# Patient Record
Sex: Female | Born: 1985 | Hispanic: Yes | Marital: Married | Smoking: Never smoker
Health system: Southern US, Community
[De-identification: ages and names within clinical notes are randomized; demographics above are authoritative.]

## PROBLEM LIST (undated history)

## (undated) ENCOUNTER — Inpatient Hospital Stay (HOSPITAL_COMMUNITY): Payer: Self-pay

## (undated) DIAGNOSIS — J302 Other seasonal allergic rhinitis: Secondary | ICD-10-CM

## (undated) DIAGNOSIS — O24419 Gestational diabetes mellitus in pregnancy, unspecified control: Secondary | ICD-10-CM

## (undated) DIAGNOSIS — Z973 Presence of spectacles and contact lenses: Secondary | ICD-10-CM

## (undated) DIAGNOSIS — F3281 Premenstrual dysphoric disorder: Secondary | ICD-10-CM

## (undated) DIAGNOSIS — Q513 Bicornate uterus: Secondary | ICD-10-CM

## (undated) DIAGNOSIS — N96 Recurrent pregnancy loss: Secondary | ICD-10-CM

## (undated) DIAGNOSIS — O039 Complete or unspecified spontaneous abortion without complication: Secondary | ICD-10-CM

## (undated) DIAGNOSIS — F411 Generalized anxiety disorder: Secondary | ICD-10-CM

## (undated) DIAGNOSIS — F339 Major depressive disorder, recurrent, unspecified: Secondary | ICD-10-CM

## (undated) HISTORY — DX: Gestational diabetes mellitus in pregnancy, unspecified control: O24.419

## (undated) HISTORY — DX: Premenstrual dysphoric disorder: F32.81

## (undated) HISTORY — DX: Other seasonal allergic rhinitis: J30.2

## (undated) HISTORY — DX: Generalized anxiety disorder: F41.1

## (undated) HISTORY — DX: Major depressive disorder, recurrent, unspecified: F33.9

## (undated) HISTORY — PX: WISDOM TOOTH EXTRACTION: SHX21

---

## 2011-01-27 ENCOUNTER — Ambulatory Visit (INDEPENDENT_AMBULATORY_CARE_PROVIDER_SITE_OTHER): Payer: Managed Care, Other (non HMO) | Admitting: Family Medicine

## 2011-01-27 ENCOUNTER — Encounter: Payer: Self-pay | Admitting: Family Medicine

## 2011-01-27 DIAGNOSIS — J069 Acute upper respiratory infection, unspecified: Secondary | ICD-10-CM

## 2011-02-02 NOTE — Assessment & Plan Note (Signed)
Summary: Congestion   Vital Signs:  Patient profile:   25 year old female Weight:      143.75 pounds O2 Sat:      99 % on Room air Temp:     98.2 degrees F oral Pulse rate:   90 / minute BP sitting:   130 / 87  (right arm) Cuff size:   regular  Vitals Entered By: Francee Piccolo CMA Duncan Dull) (January 27, 2011 10:55 AM)  O2 Flow:  Room air CC: ear ache, congestion, upset stomach//SP, URI symptoms Is Patient Diabetic? No   History of Present Illness: New patient today:       This is a 25 year old hispanic woman who presents with URI symptoms for the last 24 hours.  The patient complains of nasal congestion and clear nasal discharge, but denies sore throat, dry cough, productive cough, and earache.  The patient denies fever, dyspnea, and wheezing.   Two members of her household have had similar symptoms in the last 2 wks.  She got her flu vaccine this season. No meds have been tried.  Preventive Screening-Counseling & Management  Alcohol-Tobacco     Alcohol drinks/day: 0     Smoking Status: never  Current Medications (verified): 1)  None  Allergies (verified): No Known Drug Allergies  Past History:  Past Medical History: Seasonal allergic rhinitis  Past Surgical History: NONE  Family History: Mom and dad healthy. Three younger sisters healthy. Pat. GP: CVA and DM. Mat GP: d. 42 of "stomach cancer".  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  Is originally from [place]. No T/A/Ds.  No regular exercise. No recent visits to [place].Smoking Status:  never  Review of Systems       Decreased appetite.  No n/v/d or rash.  Also, see HPI.  Physical Exam  General:  VS normal Gen: alert, NAD, NONTOXIC. HEENT: eyes without injection, drainage, or swelling.  Ears: EACs clear, TMs with normal light reflex and landmarks.  Nose: some dried, crusty exudate adherent to some mildly injected mucosa.  No purulent d/c.  No  paranasal sinus TTP.  No facial swelling.  Throat and mouth without focal lesion.  No pharyngial swelling, erythema, or exudate.   Neck: supple, no LAD.  LUNGS: CTA bilat, nonlabored resps.  CV: RRR, no m/r/g.     Impression & Recommendations:  Problem # 1:  VIRAL URI (ICD-465.9) Assessment New Discussed self limited nature of this illness, symptomatic care, problems to call or return for.  Patient Instructions: 1)  AVOID ASPIRIN 2)  Take zyrtec D OR Allegra D (over the counter meds) for your cold symptoms. 3)  Drink 6-8 glasses of water daily.  Rest as much as you can. 4)  Call or return if not feeling improved in 7-10 days.   Orders Added: 1)  New Patient Level II [99202]

## 2011-02-07 ENCOUNTER — Encounter: Payer: Self-pay | Admitting: Family Medicine

## 2011-10-09 ENCOUNTER — Encounter: Payer: Self-pay | Admitting: Family Medicine

## 2011-10-09 ENCOUNTER — Ambulatory Visit (INDEPENDENT_AMBULATORY_CARE_PROVIDER_SITE_OTHER): Payer: Managed Care, Other (non HMO) | Admitting: Family Medicine

## 2011-10-09 DIAGNOSIS — H60509 Unspecified acute noninfective otitis externa, unspecified ear: Secondary | ICD-10-CM

## 2011-10-09 DIAGNOSIS — H60549 Acute eczematoid otitis externa, unspecified ear: Secondary | ICD-10-CM

## 2011-10-09 DIAGNOSIS — J309 Allergic rhinitis, unspecified: Secondary | ICD-10-CM | POA: Insufficient documentation

## 2011-10-09 MED ORDER — FEXOFENADINE HCL 180 MG PO TABS: 180.0000 mg | ORAL_TABLET | Freq: Every day | ORAL | Status: DC

## 2011-10-09 MED ORDER — FLUTICASONE PROPIONATE 50 MCG/ACT NA SUSP: 2.0000 | Freq: Every day | NASAL | Status: DC

## 2011-10-09 MED ORDER — NEOMYCIN-POLYMYXIN-HC 1 % OT SOLN: OTIC | Status: DC

## 2011-10-09 NOTE — Progress Notes (Signed)
OFFICE NOTE  10/09/2011  CC:  Chief Complaint  Patient presents with  . Ear Fullness    itching, pain sometimes, left worse than right, pressure, fever on Saturday.     HPI:   Patient is a 25 y.o. hispanic female who is here for allergy sx's and ear itching. Describes 1-2 months of waxing and waning nasal congestion/runny nose, fullness in upper face/orbits, watery eyes sometimes, itchy ear canals, postnasal drip, mild HA's in frontal area. Says cetirizine, which she uses rarely lately, causes her to feel drowsy.  She uses Q tips frequently to scratch the canals of her ears.  Pertinent PMH:  Never a smoker. No hx of asthma  MEDS;   No outpatient prescriptions prior to visit.    PE: Blood pressure 116/84, pulse 70, temperature 98.4 F (36.9 C), temperature source Oral, weight 148 lb (67.132 kg), last menstrual period 09/24/2011, SpO2 98.00%. Gen: Alert, well appearing.  Patient is oriented to person, place, time, and situation. VS: noted--normal. Gen: alert, NAD, NONTOXIC APPEARING. HEENT: eyes without injection, drainage, or swelling.  Ears: EACs clear, with mildly pinkish and hyperkeratotic epithelium in EACs.  No exudate or edema.  TMs with normal light reflex and landmarks.  Nose: Clear rhinorrhea, with some dried, crusty exudate adherent to mildly injected mucosa.  No purulent d/c.  No paranasal sinus TTP.  No facial swelling.  Throat and mouth without focal lesion.  No pharyngial swelling, erythema, or exudate.   Neck: supple, no LAD.   LUNGS: CTA bilat, nonlabored resps.   CV: RRR, no m/r/g. EXT: no c/c/e SKIN: no rash    IMPRESSION AND PLAN:  Allergic rhinitis D/C zyrtec. Start allegra 180mg  qd and flonase 2 sprays qd, plus nasal saline qhs.   Eczema of external ear Cortisporin otic gtts x 10d. Avoid q-tip use.     FOLLOW UP:  Return if symptoms worsen or fail to improve.

## 2011-10-09 NOTE — Assessment & Plan Note (Signed)
Cortisporin otic gtts x 10d. Avoid q-tip use.

## 2011-10-09 NOTE — Assessment & Plan Note (Signed)
D/C zyrtec. Start allegra 180mg  qd and flonase 2 sprays qd, plus nasal saline qhs.

## 2011-10-10 ENCOUNTER — Ambulatory Visit: Payer: Managed Care, Other (non HMO) | Admitting: Family Medicine

## 2012-02-12 ENCOUNTER — Ambulatory Visit: Payer: Managed Care, Other (non HMO) | Admitting: Family Medicine

## 2012-02-20 ENCOUNTER — Ambulatory Visit (INDEPENDENT_AMBULATORY_CARE_PROVIDER_SITE_OTHER): Payer: Managed Care, Other (non HMO) | Admitting: Family Medicine

## 2012-02-20 ENCOUNTER — Encounter: Payer: Self-pay | Admitting: Family Medicine

## 2012-02-20 VITALS — BP 118/78 | HR 90 | Temp 99.0°F | Resp 16 | Ht 61.0 in | Wt 147.0 lb

## 2012-02-20 DIAGNOSIS — Z0000 Encounter for general adult medical examination without abnormal findings: Secondary | ICD-10-CM | POA: Insufficient documentation

## 2012-02-20 DIAGNOSIS — Z23 Encounter for immunization: Secondary | ICD-10-CM

## 2012-02-20 DIAGNOSIS — Z124 Encounter for screening for malignant neoplasm of cervix: Secondary | ICD-10-CM

## 2012-02-20 LAB — CBC WITH DIFFERENTIAL/PLATELET
Eosinophils Relative: 1.3 % (ref 0.0–5.0)
HCT: 43 % (ref 36.0–46.0)
Hemoglobin: 14.3 g/dL (ref 12.0–15.0)
Lymphs Abs: 2.3 10*3/uL (ref 0.7–4.0)
MCV: 89.3 fl (ref 78.0–100.0)
Monocytes Absolute: 0.6 10*3/uL (ref 0.1–1.0)
Monocytes Relative: 9.3 % (ref 3.0–12.0)
Neutro Abs: 3.5 10*3/uL (ref 1.4–7.7)
RDW: 13.4 % (ref 11.5–14.6)

## 2012-02-20 LAB — LIPID PANEL
HDL: 37.2 mg/dL — ABNORMAL LOW (ref >39.00)
Total CHOL/HDL Ratio: 5
VLDL: 16.4 mg/dL (ref 0.0–40.0)

## 2012-02-20 LAB — HEPATITIS B SURFACE ANTIBODY,QUALITATIVE: Hep B S Ab: NEGATIVE

## 2012-02-20 LAB — COMPREHENSIVE METABOLIC PANEL
ALT: 22 U/L (ref 0–35)
AST: 22 U/L (ref 0–37)
Albumin: 4 g/dL (ref 3.5–5.2)
Alkaline Phosphatase: 68 U/L (ref 39–117)
Potassium: 4 mEq/L (ref 3.5–5.1)
Sodium: 139 mEq/L (ref 135–145)
Total Bilirubin: 0.2 mg/dL — ABNORMAL LOW (ref 0.3–1.2)
Total Protein: 7.4 g/dL (ref 6.0–8.3)

## 2012-02-20 NOTE — Assessment & Plan Note (Signed)
Reviewed age and gender appropriate health maintenance issues (prudent diet, regular exercise, health risks of tobacco and excessive alcohol, use of seatbelts, fire alarms in home, use of sunscreen).  Also reviewed age and gender appropriate health screening as well as vaccine recommendations.  Reassured pt regarding her recent nonfasting (and incomplete) cholesterol screening done at [facility]. Refer to GYN--she has never had pelvic or pap. Health screening panel today: CBC, CMET, TSH, FLP. No vaccine hx: will check titers of varicella and MMR, will give Tdap and TB skin test today (return in less than 30 day for REPEAT of TB test as per her school health form requirment).  Check Hep B surface antibody.   She had flu vaccine this season and will get details of when/where soon. Next CPE 1 yr.

## 2012-02-20 NOTE — Progress Notes (Signed)
Office Note 02/20/2012  CC:  Chief Complaint  Patient presents with  . school physical    HPI:  Sandra Bauer is a 26 y.o. Hispanic female who is here for CPE, has been accepted into nursing school. Feels well, no complaints except intermittent bumpy and itchy rash that comes out after touching [employer] at her job, uses topical benadryl and it helps.  No rash under any other circumstances.    She has never had a GYN exam.  No hx of sexual activity. She had Tchol and HDL testing done at [facility] 10/2011--nonfasting.  HDL 38, Tchol about 170--she was worried about this, stopped eating cheese and some other high chol/high fat foods. No FH of high cholesterol.   Glucose was normal on this screen.    Past Medical History  Diagnosis Date  . Allergy     seasonal allergic rhinitis    History reviewed. No pertinent past surgical history.  Family History  Problem Relation Age of Onset  . Cancer Maternal Grandfather     "stomach"  . Diabetes Paternal Grandfather   . Stroke Paternal Grandfather     History   Social History  . Marital Status: Single    Spouse Name: N/A    Number of Children: N/A  . Years of Education: N/A   Occupational History  . Not on file.   Social History Main Topics  . Smoking status: Never Smoker   . Smokeless tobacco: Never Used  . Alcohol Use: No  . Drug Use: No  . Sexually Active: Not on file   Other Topics Concern  . Not on file   Social History Narrative  . No narrative on file    Outpatient Prescriptions Prior to Visit  Medication Sig Dispense Refill  . fexofenadine (ALLEGRA) 180 MG tablet Take 1 tablet (180 mg total) by mouth daily.  30 tablet  11  . aspirin 325 MG EC tablet Take 325 mg by mouth daily.        . fluticasone (FLONASE) 50 MCG/ACT nasal spray Place 2 sprays into the nose daily.  16 g  12  . NEOMYCIN-POLYMYXIN-HC, OTIC, (CORTISPORIN) 1 % SOLN 3 drops in each ear every 8 hours x 10d  1 Bottle  2    No Known  Allergies  ROS Review of Systems  Constitutional: Negative for fever, chills, appetite change and fatigue.  HENT: Negative for ear pain, congestion, sore throat, neck stiffness and dental problem.   Eyes: Negative for discharge, redness and visual disturbance.  Respiratory: Negative for cough, chest tightness, shortness of breath and wheezing.   Cardiovascular: Negative for chest pain, palpitations and leg swelling.  Gastrointestinal: Negative for nausea, vomiting, abdominal pain, diarrhea and blood in stool.  Genitourinary: Negative for dysuria, urgency, frequency, hematuria, flank pain and difficulty urinating.  Musculoskeletal: Negative for myalgias, back pain, joint swelling and arthralgias.  Skin: Negative for pallor and rash.  Neurological: Negative for dizziness, speech difficulty, weakness and headaches.  Hematological: Negative for adenopathy. Does not bruise/bleed easily.  Psychiatric/Behavioral: Negative for confusion and sleep disturbance. The patient is not nervous/anxious.     PE; Blood pressure 118/78, pulse 90, temperature 99 F (37.2 C), temperature source Temporal, resp. rate 16, height 5\' 1"  (1.549 m), weight 147 lb (66.679 kg), last menstrual period 01/17/2012. Gen: Alert, well appearing.  Patient is oriented to person, place, time, and situation.  Pleasant affect, lucid thought and speech.  ENT: Ears: EACs clear, normal epithelium.  TMs with good light reflex  and landmarks bilaterally.  Eyes: no injection, icteris, swelling, or exudate.  EOMI, PERRLA. Nose: no drainage or turbinate edema/swelling.  No injection or focal lesion.  Mouth: lips without lesion/swelling.  Oral mucosa pink and moist.  Dentition intact and without obvious caries or gingival swelling.  Oropharynx without erythema, exudate, or swelling.  Neck - No masses or thyromegaly or limitation in range of motion RRR, without murmur, rub, or gallop. Carotid pulses easily palpable, symmetric pulsations, no  bruit or palpable dilatation. Radial, femoral, and ankle pulses easily palpable and symmetric. No abdominal bruit. No varicosities.  Cap refill in extremities is brisk. Chest with symmetric expansion, good aeration, nonlabored respirations.  Clear and equal breath sounds in all lung fields.  No clubbing or cyanosis. ABD: soft, NT, ND, BS normal.  No hepatospenomegaly or mass.  No bruits. EXT: no clubbing, cyanosis, or edema.  Skin - no sores or suspicious lesions or rashes or color changes   Visual Acuity Screening   Right eye Left eye Both eyes  Without correction: 20/30 20/30 20/20   With correction:       Pertinent labs:  None today  ASSESSMENT AND PLAN:   Health maintenance examination Reviewed age and gender appropriate health maintenance issues (prudent diet, regular exercise, health risks of tobacco and excessive alcohol, use of seatbelts, fire alarms in home, use of sunscreen).  Also reviewed age and gender appropriate health screening as well as vaccine recommendations.  Reassured pt regarding her recent nonfasting (and incomplete) cholesterol screening done at [facility]. Refer to GYN--she has never had pelvic or pap. Health screening panel today: CBC, CMET, TSH, FLP. No vaccine hx: will check titers of varicella and MMR, will give Tdap and TB skin test today (return in less than 30 day for REPEAT of TB test as per her school health form requirment).  Check Hep B surface antibody.   She had flu vaccine this season and will get details of when/where soon. Next CPE 1 yr.     Recommended she use OTC hydrocortisone ointment rather than topical benadryl for her contact derm that she gets from [employer].  FOLLOW UP:  Return for 1 yr for CPE.  Also, return for nurse visit in 27-30 days for another TB skin test..

## 2012-02-22 ENCOUNTER — Ambulatory Visit (INDEPENDENT_AMBULATORY_CARE_PROVIDER_SITE_OTHER): Payer: Managed Care, Other (non HMO) | Admitting: *Deleted

## 2012-02-22 ENCOUNTER — Encounter: Payer: Self-pay | Admitting: *Deleted

## 2012-02-22 DIAGNOSIS — Z23 Encounter for immunization: Secondary | ICD-10-CM

## 2012-02-22 NOTE — Progress Notes (Signed)
Pt arrived for first Hep B and to have PPD read.  PPD read and is negative.  Hep B #1 given.  While talking with pt we discovered that her last flu shot was 01/2011.  Advised pt that she has not had flu vaccine for this season and would therefore need one to satisfy school requirements.  This is also given. Pt will return on 03/19/12 for 2nd PPD.  She will return on 03/26/12 for 2nd Hep B.

## 2012-02-28 ENCOUNTER — Encounter: Payer: Self-pay | Admitting: Obstetrics and Gynecology

## 2012-03-08 ENCOUNTER — Encounter (INDEPENDENT_AMBULATORY_CARE_PROVIDER_SITE_OTHER): Payer: Managed Care, Other (non HMO) | Admitting: Obstetrics and Gynecology

## 2012-03-08 DIAGNOSIS — Z124 Encounter for screening for malignant neoplasm of cervix: Secondary | ICD-10-CM

## 2012-03-08 DIAGNOSIS — Z01419 Encounter for gynecological examination (general) (routine) without abnormal findings: Secondary | ICD-10-CM

## 2012-03-19 ENCOUNTER — Ambulatory Visit (INDEPENDENT_AMBULATORY_CARE_PROVIDER_SITE_OTHER): Payer: Managed Care, Other (non HMO) | Admitting: *Deleted

## 2012-03-19 VITALS — Temp 98.3°F

## 2012-03-19 DIAGNOSIS — Z111 Encounter for screening for respiratory tuberculosis: Secondary | ICD-10-CM

## 2012-03-19 DIAGNOSIS — Z23 Encounter for immunization: Secondary | ICD-10-CM

## 2012-03-19 NOTE — Progress Notes (Signed)
Pt presents for PPD and MMR for school.  PPD placed on left forearm.  MMR given right arm.

## 2012-03-21 ENCOUNTER — Encounter: Payer: Self-pay | Admitting: *Deleted

## 2012-03-21 LAB — TB SKIN TEST: Induration: 0

## 2012-03-25 ENCOUNTER — Ambulatory Visit (INDEPENDENT_AMBULATORY_CARE_PROVIDER_SITE_OTHER): Payer: Managed Care, Other (non HMO) | Admitting: *Deleted

## 2012-03-25 DIAGNOSIS — Z23 Encounter for immunization: Secondary | ICD-10-CM

## 2012-03-25 NOTE — Progress Notes (Signed)
Pt presented for 2nd Hep B vaccine.  Paperwork completed and given to patient.  She will return in September 2013 for 3rd injection.

## 2012-03-26 ENCOUNTER — Ambulatory Visit: Payer: Managed Care, Other (non HMO)

## 2012-04-29 ENCOUNTER — Telehealth: Payer: Self-pay | Admitting: Obstetrics and Gynecology

## 2012-04-29 NOTE — Telephone Encounter (Signed)
Triage/epic 

## 2012-04-30 NOTE — Telephone Encounter (Signed)
PT STATES HAS NEVER HAD CONTRACEPTION BEFORE AND HAD SOME QUESTIONS ABOUT THE NEXPLANON, PT ADVISED TO COME IN FOR APPOINTMENT FOR CONSULTATION TO DISCUSS OPTIONS, PT VOICES AGREEMENT, APPT SCHED FOR TUES 05/07/12 @ 1445 W/ EP.

## 2012-05-07 ENCOUNTER — Encounter: Payer: Managed Care, Other (non HMO) | Admitting: Obstetrics and Gynecology

## 2012-07-09 ENCOUNTER — Ambulatory Visit (INDEPENDENT_AMBULATORY_CARE_PROVIDER_SITE_OTHER): Payer: Managed Care, Other (non HMO) | Admitting: Obstetrics and Gynecology

## 2012-07-09 ENCOUNTER — Encounter: Payer: Self-pay | Admitting: Obstetrics and Gynecology

## 2012-07-09 VITALS — BP 110/70 | HR 70 | Wt 148.0 lb

## 2012-07-09 DIAGNOSIS — Z309 Encounter for contraceptive management, unspecified: Secondary | ICD-10-CM

## 2012-07-09 DIAGNOSIS — IMO0001 Reserved for inherently not codable concepts without codable children: Secondary | ICD-10-CM

## 2012-07-09 LAB — POCT URINE PREGNANCY: Preg Test, Ur: NEGATIVE

## 2012-07-09 NOTE — Progress Notes (Signed)
25 YO has reviewed  methods of contraception on hand out.  Overview was given of each.  Patient not receptive to implantable devices, can't remember pills, has sensitive skin and  doesn't like shots.  She concluded that condoms with a spermacide would be best for her.  Advised that contraceptive foam and condoms used correctly every time a person has intercourse is as effective as birth control pills.  Also added that it helps to protect against STDs and side effects relate to any allergic reaction one may experience.  A: Contraceptive Consultation  P: Patient to continue condoms and spermacide (to try female condom)      RTO-3.2014 AEx  Tonye Tancredi, PA-C

## 2012-08-22 ENCOUNTER — Other Ambulatory Visit: Payer: Self-pay | Admitting: *Deleted

## 2012-08-22 MED ORDER — FEXOFENADINE HCL 180 MG PO TABS: 180.0000 mg | ORAL_TABLET | Freq: Every day | ORAL | Status: DC

## 2012-08-22 NOTE — Telephone Encounter (Signed)
Refill requested by parent.  RX sent.

## 2012-09-02 ENCOUNTER — Telehealth: Payer: Self-pay | Admitting: Family Medicine

## 2012-09-02 NOTE — Telephone Encounter (Signed)
Caller: Ikeya/Patient; Phone: [phone]; Reason for Call: Patient request to speak with Tiffany or Dr.  Milinda Cave.  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  Teacher stated she would need to get a doctors note in order for her to accommodate the patients needs.  Please call patient back.  Thanks

## 2012-09-02 NOTE — Telephone Encounter (Signed)
Pt will come for appt on Thursday.

## 2012-09-05 ENCOUNTER — Encounter: Payer: Self-pay | Admitting: Family Medicine

## 2012-09-05 ENCOUNTER — Ambulatory Visit (INDEPENDENT_AMBULATORY_CARE_PROVIDER_SITE_OTHER): Payer: Managed Care, Other (non HMO) | Admitting: Family Medicine

## 2012-09-05 VITALS — BP 134/91 | HR 102 | Ht 61.0 in | Wt 140.0 lb

## 2012-09-05 DIAGNOSIS — F411 Generalized anxiety disorder: Secondary | ICD-10-CM

## 2012-09-05 DIAGNOSIS — F419 Anxiety disorder, unspecified: Secondary | ICD-10-CM

## 2012-09-05 MED ORDER — PROPRANOLOL HCL 40 MG PO TABS: ORAL_TABLET | ORAL | Status: DC

## 2012-09-05 MED ORDER — CITALOPRAM HYDROBROMIDE 20 MG PO TABS: 20.0000 mg | ORAL_TABLET | Freq: Every day | ORAL | Status: DC

## 2012-09-05 NOTE — Progress Notes (Signed)
OFFICE NOTE  09/05/2012  CC:  Chief Complaint  Patient presents with  . Anxiety    with tests and clinicals in school; needs letter, unsure if needs meds     HPI: Patient is a 26 y.o. 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  Describes test anxiety, esp when timed, also having problems with clinicals--these bring on heart palpitations, sweats, dizziness.  This has occurred before when working at [employer] but she learned to handle it there over time.  She would often take a short break for this. 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  Denies depressed mood. ROS: no fevers, no tremors, no constipation, no cold or heat intolerance.     Pertinent PMH:  Past Medical History  Diagnosis Date  . Allergy     seasonal allergic rhinitis  . H/O varicella     MEDS:  Outpatient Prescriptions Prior to Visit  Medication Sig Dispense Refill  . fexofenadine (ALLEGRA) 180 MG tablet Take 1 tablet (180 mg total) by mouth daily.  30 tablet  11  . fluticasone (FLONASE) 50 MCG/ACT nasal spray Place 2 sprays into the nose daily.  16 g  12  . aspirin 325 MG EC tablet Take 325 mg by mouth daily.        . NEOMYCIN-POLYMYXIN-HC, OTIC, (CORTISPORIN) 1 % SOLN 3 drops in each ear every 8 hours x 10d  1 Bottle  2    PE: Blood pressure 134/91, pulse 102, height 5\' 1"  (1.549 m), weight 140 lb (63.504 kg). Wt Readings from Last 2 Encounters:  09/05/12 140 lb (63.504 kg)  07/09/12 148 lb (67.132 kg)  Gen: alert, oriented x 4, affect pleasant.  Lucid thinking and conversation noted. HEENT: PERRLA, EOMI.   Neck: no LAD, mass, or thyromegaly. CV: RRR, no m/r/g LUNGS: CTA bilat, nonlabored. NEURO: no tremor or tics noted on observation.  Coordination intact. CN 2-12 grossly intact bilaterally, strength 5/5 in all extremeties.  No ataxia.  LAB: none  IMPRESSION AND PLAN:  Anxiety  disorder GAD/social anxiety with panic. Start citalopram 20mg  qd and use propranolol 20-40 mg bid prn (tests and clinical performances). Letter written to ask for testing accomodations for her: no time limit on tests and quiet environment.   Therapeutic expectations and side effect profile of medication discussed today.  Patient's questions answered.  Spent 25 min with pt today, with >50% of this time spent in counseling and care coordination regarding the above problems.  FOLLOW UP: 83mo

## 2012-09-05 NOTE — Assessment & Plan Note (Signed)
GAD/social anxiety with panic. Start citalopram 20mg  qd and use propranolol 20-40 mg bid prn (tests and clinical performances). Letter written to ask for testing accomodations for her: no time limit on tests and quiet environment.

## 2012-10-07 ENCOUNTER — Ambulatory Visit (INDEPENDENT_AMBULATORY_CARE_PROVIDER_SITE_OTHER): Payer: Managed Care, Other (non HMO) | Admitting: Family Medicine

## 2012-10-07 ENCOUNTER — Encounter: Payer: Self-pay | Admitting: Family Medicine

## 2012-10-07 VITALS — BP 129/86 | HR 75 | Ht 61.0 in | Wt 137.0 lb

## 2012-10-07 DIAGNOSIS — F419 Anxiety disorder, unspecified: Secondary | ICD-10-CM

## 2012-10-07 DIAGNOSIS — F411 Generalized anxiety disorder: Secondary | ICD-10-CM

## 2012-10-07 MED ORDER — CITALOPRAM HYDROBROMIDE 20 MG PO TABS: 20.0000 mg | ORAL_TABLET | Freq: Every day | ORAL | Status: DC

## 2012-10-07 NOTE — Progress Notes (Signed)
OFFICE NOTE  10/07/2012  CC:  Chief Complaint  Patient presents with  . Follow-up    anxiety     HPI: Patient is a 26 y.o. Hispanic female who is here for 1 mo f/u generalized anxiety/social anxiety d/o with panic. Started citalopram 20mg  qd and propranolol 20-40 mg bid prn about 1 mo ago. She says she feels very good.  Her anxiety is MUCH improved.  Better able to take tests and do clinical presentations, thinking more clearly, sleeping better, mood improved.  She is actually splitting the 20mg  tab and taking 1/2 tab daily b/c she had too much sedation on the whole 20 mg tab.  She says she has not had to use the propranolol at all.  Pertinent PMH:  Past Medical History  Diagnosis Date  . Allergy     seasonal allergic rhinitis  . H/O varicella   Anxiety  MEDS:  Outpatient Prescriptions Prior to Visit  Medication Sig Dispense Refill  . citalopram (CELEXA) 20 MG tablet Take 1 tablet (20 mg total) by mouth daily.  30 tablet  1  . fexofenadine (ALLEGRA) 180 MG tablet Take 1 tablet (180 mg total) by mouth daily.  30 tablet  11  . fluticasone (FLONASE) 50 MCG/ACT nasal spray Place 2 sprays into the nose daily.  16 g  12  . propranolol (INDERAL) 40 MG tablet 1/2 to 1 tab po two times daily as needed for stress/anxiety  30 tablet  3    PE: Blood pressure 129/86, pulse 75, height 5\' 1"  (1.549 m), weight 137 lb (62.143 kg). Gen: Alert, well appearing.  Patient is oriented to person, place, time, and situation. AFFECT: pleasant, lucid thought and speech. CV: RRR, no m/r/g.   LUNGS: CTA bilat, nonlabored resps, good aeration in all lung fields.   IMPRESSION AND PLAN:  Anxiety disorder Much improved. Wants to stay on 10mg  qd and I think this is fine. She may increase to whole 20mg  tab in the future if she feels like the effect has waned. Routine f/u 34mo.  Call or return earlier if problems.   An After Visit Summary was printed and given to the patient.   FOLLOW UP: 34mo

## 2012-10-07 NOTE — Assessment & Plan Note (Signed)
Much improved. Wants to stay on 10mg  qd and I think this is fine. She may increase to whole 20mg  tab in the future if she feels like the effect has waned. Routine f/u 67mo.  Call or return earlier if problems.

## 2013-01-20 ENCOUNTER — Other Ambulatory Visit: Payer: Self-pay | Admitting: *Deleted

## 2013-01-20 ENCOUNTER — Telehealth: Payer: Self-pay | Admitting: Family Medicine

## 2013-01-20 MED ORDER — NEOMYCIN-POLYMYXIN-HC 1 % OT SOLN: OTIC | Status: DC

## 2013-01-20 NOTE — Telephone Encounter (Signed)
Rx for her cortisporin otic authorized, #1, with RF x 2. Pls notify pt.-thx

## 2013-01-20 NOTE — Telephone Encounter (Signed)
Refill request for CORTISPORIN Last filled- 10/09/11, 1 X 2 Last seen- 10/07/12 Follow up - 6 months PC from [facility], states pt is at work and ear is hurting.  Would like same ear drops as before. Please advise.

## 2013-01-21 NOTE — Telephone Encounter (Signed)
Message left that RX has been sent on 01/20/13.  To call with any questions.

## 2013-04-07 ENCOUNTER — Ambulatory Visit (INDEPENDENT_AMBULATORY_CARE_PROVIDER_SITE_OTHER): Payer: Managed Care, Other (non HMO) | Admitting: Family Medicine

## 2013-04-07 ENCOUNTER — Encounter: Payer: Self-pay | Admitting: Family Medicine

## 2013-04-07 VITALS — BP 121/86 | HR 74 | Temp 98.0°F | Resp 14 | Wt 141.8 lb

## 2013-04-07 DIAGNOSIS — F411 Generalized anxiety disorder: Secondary | ICD-10-CM

## 2013-04-07 DIAGNOSIS — F419 Anxiety disorder, unspecified: Secondary | ICD-10-CM

## 2013-04-07 DIAGNOSIS — J309 Allergic rhinitis, unspecified: Secondary | ICD-10-CM

## 2013-04-07 MED ORDER — CITALOPRAM HYDROBROMIDE 10 MG PO TABS: 10.0000 mg | ORAL_TABLET | Freq: Every day | ORAL | Status: DC

## 2013-04-07 MED ORDER — PREDNISONE 20 MG PO TABS: ORAL_TABLET | ORAL | Status: DC

## 2013-04-07 MED ORDER — MONTELUKAST SODIUM 10 MG PO TABS: 10.0000 mg | ORAL_TABLET | Freq: Every day | ORAL | Status: DC

## 2013-04-07 NOTE — Progress Notes (Signed)
OFFICE NOTE  04/07/2013  CC:  Chief Complaint  Patient presents with  . Follow-up    4-mth. [Anxiety]     HPI: Patient is a 27 y.o. Hispanic female who is here for 4 mo f/u anxiety. Doing well on 10mg  citalopram qhs.  Says the 20mg  qhs dosing made her too fatigued/sedated in daytime even after trying it for weeks. 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  She is now working at [employer] full time and waiting until this May to retake the course.  Since getting back on the med she feels much better and says she realizes she needs to stay on the med.  Mood is not depressed.  Sleep and appetite are good.  Says allergies have been worse x 7-10d: nasal congestion/sneezing/runny nose, some orbital HA's lately.  No significant PND, cough, or ST. No cough or wheezing, no face pain or upper teeth pain.  She has been compliant with her flonase and allegra.   Pertinent PMH:  Past Medical History  Diagnosis Date  . Allergy     seasonal allergic rhinitis  . H/O varicella    Past Surgical History  Procedure Laterality Date  . Wisdom tooth extraction  2 YEARS AGO   Past family and social history reviewed and there are no changes since the patient's last office visit with me.  MEDS:  Outpatient Prescriptions Prior to Visit  Medication Sig Dispense Refill  . citalopram (CELEXA) 20 MG tablet Take 1 tablet (20 mg total) by mouth daily.  30 tablet  11  . fexofenadine (ALLEGRA) 180 MG tablet Take 1 tablet (180 mg total) by mouth daily.  30 tablet  11  . fluticasone (FLONASE) 50 MCG/ACT nasal spray Place 2 sprays into the nose daily.  16 g  12  . NEOMYCIN-POLYMYXIN-HC, OTIC, (CORTISPORIN) 1 % SOLN 3 drops in each ear every 8 hours x 10d  1 Bottle  2  . propranolol (INDERAL) 40 MG tablet 1/2 to 1 tab po two times daily as needed  for stress/anxiety  30 tablet  3   No facility-administered medications prior to visit.    PE: Blood pressure 121/86, pulse 74, temperature 98 F (36.7 C), temperature source Oral, resp. rate 14, weight 141 lb 12 oz (64.297 kg), last menstrual period 04/05/2013, SpO2 99.00%. Gen: Alert, well appearing.  Patient is oriented to person, place, time, and situation. VS: noted--normal. HEENT: eyes without injection, drainage, or swelling.  Ears: EACs clear, TMs with normal light reflex and landmarks.  Nose: Clear rhinorrhea, with some dried, crusty exudate adherent to edematous turbinates.  No purulent d/c.  No paranasal sinus TTP.  No facial swelling.  Throat and mouth without focal lesion.  No pharyngial swelling, erythema, or exudate.   Neck: supple, no LAD.   LUNGS: CTA bilat, nonlabored resps.   CV: RRR, no m/r/g. EXT: no c/c/e SKIN: no rash  IMPRESSION AND PLAN:  Anxiety disorder Stable when compliant with med.  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 Continue citalopram 10mg  qd, new rx today.  Allergic rhinitis Acutely worsened. Prednisone 40mg  qd x 3d, then 20mg  qd x 3d. Start singulair 10mg  po qd. Continue flonase and allegra qd.   An After Visit Summary was  printed and given to the patient.  FOLLOW UP: 6 mo

## 2013-04-07 NOTE — Assessment & Plan Note (Addendum)
Stable when compliant with med.  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 Continue citalopram 10mg  qd, new rx today.

## 2013-04-07 NOTE — Assessment & Plan Note (Signed)
Acutely worsened. Prednisone 40mg  qd x 3d, then 20mg  qd x 3d. Start singulair 10mg  po qd. Continue flonase and allegra qd.

## 2013-04-17 ENCOUNTER — Other Ambulatory Visit: Payer: Self-pay | Admitting: Family Medicine

## 2013-04-17 NOTE — Telephone Encounter (Signed)
Rx request to pharmacy/[facility]  

## 2013-05-14 ENCOUNTER — Ambulatory Visit (INDEPENDENT_AMBULATORY_CARE_PROVIDER_SITE_OTHER): Payer: Managed Care, Other (non HMO) | Admitting: Family Medicine

## 2013-05-14 ENCOUNTER — Encounter: Payer: Self-pay | Admitting: Family Medicine

## 2013-05-14 VITALS — BP 108/74 | HR 86 | Temp 98.3°F | Resp 14 | Wt 149.5 lb

## 2013-05-14 DIAGNOSIS — IMO0001 Reserved for inherently not codable concepts without codable children: Secondary | ICD-10-CM

## 2013-08-08 ENCOUNTER — Ambulatory Visit (INDEPENDENT_AMBULATORY_CARE_PROVIDER_SITE_OTHER): Payer: Managed Care, Other (non HMO) | Admitting: Family Medicine

## 2013-08-08 ENCOUNTER — Encounter: Payer: Self-pay | Admitting: Family Medicine

## 2013-08-08 VITALS — BP 125/88 | HR 74 | Temp 100.0°F | Resp 16 | Ht 61.0 in | Wt 149.0 lb

## 2013-08-08 DIAGNOSIS — K2970 Gastritis, unspecified, without bleeding: Secondary | ICD-10-CM

## 2013-08-08 MED ORDER — PROMETHAZINE HCL 25 MG RE SUPP: 25.0000 mg | Freq: Four times a day (QID) | RECTAL | Status: DC | PRN

## 2013-08-08 MED ORDER — PROMETHAZINE HCL 12.5 MG PO TABS: ORAL_TABLET | ORAL | Status: DC

## 2013-08-08 NOTE — Assessment & Plan Note (Signed)
Phenergan 12.5mg  tabs, 1-2 q6h prn.  If unable to keep pills down, I rx'd 25mg  phenergan rectal suppository to take 1 PR q6h prn. Drink frequent small sips of water or gatorade (or poweraid).  Also, try popsickles, ice chips, or jello.   If you tolerate liquids w/out vomiting all day today then you may try starting small amounts of bland foods this evening (dry toast, crackers, soup, rice). Signs/symptoms to call or return for were reviewed and pt expressed understanding.

## 2013-08-08 NOTE — Progress Notes (Signed)
OFFICE NOTE  08/08/2013  CC:  Chief Complaint  Patient presents with  . Emesis    since 2am.     HPI: Patient is a 27 y.o. Hispanic female who is here for nausea and vomiting. Onset about 9 hours ago in the middle of the night.   Felt a bit tired yesterday but has not been having any GI sx's lately until now.  No diarrhea.  Stomach not hurting or cramping but after most recent vomiting episode it hurt in upper abdomen from the strain.  No bile or blood emesis.  Recalls nothing abnormal eaten recently.  No subjective f/c. Some HA and dizziness/cold sweats after last vomiting episode.   Currently feels very nauseous.  No urinary complaints.   LMP was 07/14/13--regular.  Not sexually active.  No recent known sick contacts, although she did visit her mom in [place] recently (got back 4d/a).  Pertinent PMH:  Past Medical History  Diagnosis Date  . Allergy     seasonal allergic rhinitis  . H/O varicella    Past surgical, social, and family history reviewed and no changes noted since last office visit.  MEDS:  Outpatient Prescriptions Prior to Visit  Medication Sig Dispense Refill  . citalopram (CELEXA) 10 MG tablet Take 1 tablet (10 mg total) by mouth daily.  30 tablet  6  . fexofenadine (ALLEGRA) 180 MG tablet Take 1 tablet (180 mg total) by mouth daily.  30 tablet  11  . fluticasone (FLONASE) 50 MCG/ACT nasal spray USE 2 SPRAYS INTO THE NOSE DAILY.  16 g  5  . montelukast (SINGULAIR) 10 MG tablet Take 1 tablet (10 mg total) by mouth at bedtime.  30 tablet  6  . NEOMYCIN-POLYMYXIN-HC, OTIC, (CORTISPORIN) 1 % SOLN 3 drops in each ear every 8 hours x 10d  1 Bottle  2   No facility-administered medications prior to visit.    PE: Blood pressure 125/88, pulse 74, temperature 100 F (37.8 C), temperature source Temporal, resp. rate 16, height 5\' 1"  (1.549 m), weight 149 lb (67.586 kg), last menstrual period 07/05/2013, SpO2 98.00%. Gen: alert, tired-appearing but in NAD, holding  emesis basin.  Oriented x 4. AFFECT: pleasant, lucid thought and speech. ENT:  Eyes: no injection, icteris, swelling, or exudate.  EOMI, PERRLA. Nose: no drainage or turbinate edema/swelling.  No injection or focal lesion.  Mouth: lips without lesion/swelling.  Oral mucosa pink and moist.  Dentition intact and without obvious caries or gingival swelling.  Oropharynx without erythema, exudate, or swelling.  Neck - No masses or thyromegaly or limitation in range of motion CV: RRR, no m/r/g.   LUNGS: CTA bilat, nonlabored resps, good aeration in all lung fields. ABD: soft, nondistended, BS normal.  No HSM or mass.  She is mildly TTP in all regions of abd except LUQ.  No guarding or rebound tenderness. EXT: no clubbing, cyanosis, or edema.   LABS: none  IMPRESSION AND PLAN:  Viral gastritis Phenergan 12.5mg  tabs, 1-2 q6h prn.  If unable to keep pills down, I rx'd 25mg  phenergan rectal suppository to take 1 PR q6h prn. Drink frequent small sips of water or gatorade (or poweraid).  Also, try popsickles, ice chips, or jello.   If you tolerate liquids w/out vomiting all day today then you may try starting small amounts of bland foods this evening (dry toast, crackers, soup, rice). Signs/symptoms to call or return for were reviewed and pt expressed understanding.    An After Visit Summary was printed and  given to the patient.  Letter excusing her from work today, tomorrow, and the next day was written.  May return 08/11/13.  She'll call if still too sick to return at that point.  FOLLOW UP: prn

## 2013-08-08 NOTE — Patient Instructions (Addendum)
Drink frequent small sips of water or gatorade (or poweraid).  Also, try popsickles, ice chips, or jello.   If you tolerate liquids w/out vomiting all day today then you may try starting small amounts of bland foods this evening (dry toast, crackers, soup, rice).

## 2013-10-08 ENCOUNTER — Ambulatory Visit: Payer: Managed Care, Other (non HMO) | Admitting: Family Medicine

## 2013-10-09 ENCOUNTER — Ambulatory Visit (INDEPENDENT_AMBULATORY_CARE_PROVIDER_SITE_OTHER): Payer: Managed Care, Other (non HMO) | Admitting: Family Medicine

## 2013-10-09 ENCOUNTER — Ambulatory Visit: Payer: Managed Care, Other (non HMO) | Admitting: Family Medicine

## 2013-10-09 ENCOUNTER — Encounter: Payer: Self-pay | Admitting: Family Medicine

## 2013-10-09 VITALS — BP 157/80 | HR 67 | Temp 98.8°F | Resp 18 | Ht 61.0 in | Wt 147.0 lb

## 2013-10-09 DIAGNOSIS — Z111 Encounter for screening for respiratory tuberculosis: Secondary | ICD-10-CM

## 2013-10-09 DIAGNOSIS — Z23 Encounter for immunization: Secondary | ICD-10-CM

## 2013-10-09 DIAGNOSIS — Z0000 Encounter for general adult medical examination without abnormal findings: Secondary | ICD-10-CM

## 2013-10-09 NOTE — Assessment & Plan Note (Addendum)
Reviewed age and gender appropriate health maintenance issues (prudent diet, regular exercise, health risks of tobacco and excessive alcohol, use of seatbelts, fire alarms in home, use of sunscreen).  Also reviewed age and gender appropriate health screening as well as vaccine recommendations. Flu vaccine IM and PPD placed today. No labs needed. Encouraged pt to get a full eye exam by optometrist or ophthalmologist. Recheck of her bp today was normal in both arms after an initial high systolic reading --she admits she had just been in an argument with someone prior to her appt here. 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

## 2013-10-09 NOTE — Progress Notes (Signed)
OFFICE NOTE  10/09/2013  CC:  Chief Complaint  Patient presents with  . Follow-up    6 month     HPI: Patient is a 27 y.o. 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 Feeling well, no acute complaints.  Mood and anxiety level stable on citalopram 10mg  qd.  She has a GYN MD, Dr. Lowell Guitar, who did pelvic/pap 02/2012.  Pertinent PMH:  Past Medical History  Diagnosis Date  . Seasonal allergic rhinitis   . H/O varicella   . GAD (generalized anxiety disorder)    Past Surgical History  Procedure Laterality Date  . Wisdom tooth extraction  2 YEARS AGO   Family History  Problem Relation Age of Onset  . Cancer Maternal Grandfather     "stomach"  . Diabetes Paternal Grandfather   . Stroke Paternal Grandfather    History   Social History Narrative   Orig from [place], lives with Mr. And Mrs. Herb Monia Pouch in [place].   Has one little brother.   Attending nursing school, works full time at [employer] in [place].   No T/A/Ds.   ROS: Review of Systems  Constitutional: Negative for fever, chills, appetite change and fatigue.  HENT: Negative for congestion, dental problem, ear pain and sore throat.   Eyes: Negative for discharge, redness and visual disturbance.  Respiratory: Negative for cough, chest tightness, shortness of breath and wheezing.   Cardiovascular: Negative for chest pain, palpitations and leg swelling.  Gastrointestinal: Negative for nausea, vomiting, abdominal pain, diarrhea and blood in stool.  Genitourinary: Negative for dysuria, urgency, frequency, hematuria, flank pain and difficulty urinating.  Musculoskeletal: Negative for arthralgias, back pain, joint swelling, myalgias and neck stiffness.  Skin: Negative for pallor and rash.  Neurological: Negative for dizziness, speech difficulty, weakness and headaches.  Hematological: Negative for adenopathy. Does not bruise/bleed easily.  Psychiatric/Behavioral: Negative for confusion and sleep  disturbance. The patient is not nervous/anxious.   LMP 09/30/13.  Menses are regular and w/out excessive bleeding.   MEDS: Not taking phenergan or allegra listed below Outpatient Prescriptions Prior to Visit  Medication Sig Dispense Refill  . citalopram (CELEXA) 10 MG tablet Take 1 tablet (10 mg total) by mouth daily.  30 tablet  6  . fluticasone (FLONASE) 50 MCG/ACT nasal spray USE 2 SPRAYS INTO THE NOSE DAILY.  16 g  5  . montelukast (SINGULAIR) 10 MG tablet Take 1 tablet (10 mg total) by mouth at bedtime.  30 tablet  6  . NEOMYCIN-POLYMYXIN-HC, OTIC, (CORTISPORIN) 1 % SOLN 3 drops in each ear every 8 hours x 10d  1 Bottle  2  . fexofenadine (ALLEGRA) 180 MG tablet Take 1 tablet (180 mg total) by mouth daily.  30 tablet  11  . promethazine (PHENERGAN) 12.5 MG tablet 1-2 tabs po q6h prn nausea  30 tablet  1  . promethazine (PHENERGAN) 25 MG suppository Place 1 suppository (25 mg total) rectally every 6 (six) hours as needed for nausea.  12 each  0   No facility-administered medications prior to visit.    PE: Blood pressure 157/80, pulse 67, temperature 98.8 F (37.1 C), temperature source Temporal, resp. rate 18, height 5\' 1"  (1.549 m), weight 147 lb (66.679 kg), last menstrual period 09/30/2013, SpO2 100.00%.  BP recheck at end of exam: right arm 108/70, left arm 106/68. Exam chaperoned by Faythe Ghee, CMA. Gen: Alert, well appearing.  Patient is oriented to person, place, time, and situation. AFFECT: pleasant, lucid thought and speech.  ENT: Ears: EACs clear, normal epithelium.  TMs with good light reflex and landmarks bilaterally.  Eyes: no injection, icteris, swelling, or exudate.  EOMI, PERRLA.  Fundoscopy: clear optic disc margins, normal retinal vasculature. Nose: no drainage or turbinate edema/swelling.  No injection or focal lesion.  Mouth: lips without lesion/swelling.  Oral mucosa pink and moist.  Dentition intact and without obvious caries or gingival swelling.  Oropharynx  without erythema, exudate, or swelling.  Neck: supple/nontender.  No LAD, mass, or TM.   CV: RRR, no m/r/g.   LUNGS: CTA bilat, nonlabored resps, good aeration in all lung fields. ABD: soft, NT, ND, BS normal.  No hepatospenomegaly or mass.  No bruits. EXT: no clubbing, cyanosis, or edema.  Musculoskeletal: no joint swelling, erythema, warmth, or tenderness.  ROM of all joints intact. Skin - no sores or suspicious lesions or rashes or color changes Breast exam and pelvic exam deferred.  LAB: none today  IMPRESSION AND PLAN:  Health maintenance examination Reviewed age and gender appropriate health maintenance issues (prudent diet, regular exercise, health risks of tobacco and excessive alcohol, use of seatbelts, fire alarms in home, use of sunscreen).  Also reviewed age and gender appropriate health screening as well as vaccine recommendations. Flu vaccine IM and PPD placed today. No labs needed. Encouraged pt to get a full eye exam by optometrist or ophthalmologist. Recheck of her bp today was normal in both arms after an initial high systolic reading --she admits she had just been in an argument with someone prior to her appt here. 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  An After Visit Summary was printed and given to the patient.  FOLLOW UP:

## 2013-10-11 ENCOUNTER — Ambulatory Visit (INDEPENDENT_AMBULATORY_CARE_PROVIDER_SITE_OTHER): Payer: Managed Care, Other (non HMO) | Admitting: Physician Assistant

## 2013-10-11 DIAGNOSIS — Z09 Encounter for follow-up examination after completed treatment for conditions other than malignant neoplasm: Secondary | ICD-10-CM

## 2013-10-11 DIAGNOSIS — Z111 Encounter for screening for respiratory tuberculosis: Secondary | ICD-10-CM

## 2013-10-11 LAB — TB SKIN TEST
Induration: 0 mm
TB Skin Test: NEGATIVE

## 2013-10-11 NOTE — Progress Notes (Signed)
  Subjective:    Patient ID: Sandra Bauer, female    DOB: [birth year], 27 y.o.   MRN: [id number]  HPI presents today for TB read    Review of Systems     Objective:   Physical Exam        Assessment & Plan:  TB skin test negative. 0 mm induration

## 2013-10-12 NOTE — Progress Notes (Signed)
Patient here for TB read only. Placed at another office but is within window. No patient/provider interaction today.

## 2013-10-21 ENCOUNTER — Telehealth: Payer: Self-pay | Admitting: Family Medicine

## 2013-10-21 NOTE — Telephone Encounter (Signed)
Please mail a copy of the patient's shot record to her home address.

## 2013-11-05 NOTE — Telephone Encounter (Signed)
Wasn't this mailed already?

## 2013-12-29 ENCOUNTER — Ambulatory Visit (INDEPENDENT_AMBULATORY_CARE_PROVIDER_SITE_OTHER): Payer: Managed Care, Other (non HMO) | Admitting: Nurse Practitioner

## 2013-12-29 ENCOUNTER — Encounter: Payer: Self-pay | Admitting: Nurse Practitioner

## 2013-12-29 VITALS — BP 110/60 | HR 63 | Temp 98.4°F | Ht 61.0 in | Wt 142.0 lb

## 2013-12-29 DIAGNOSIS — Z3009 Encounter for other general counseling and advice on contraception: Secondary | ICD-10-CM | POA: Insufficient documentation

## 2013-12-29 DIAGNOSIS — J069 Acute upper respiratory infection, unspecified: Secondary | ICD-10-CM

## 2013-12-29 MED ORDER — ETONOGESTREL-ETHINYL ESTRADIOL 0.12-0.015 MG/24HR VA RING: VAGINAL_RING | VAGINAL | Status: DC

## 2013-12-29 NOTE — Patient Instructions (Signed)
Place ring in vagina on or before day 5 of cycle ( Day 1 is day you start menses). Use condoms for at least 7 days after you begin use. If ring comes out, rinse & replace within 3 hours. If you discover ring has been out for more than 3 hours, rinse & replace & use condoms for 7 days. For sinus congestion: start neilmed sinus rinse daily & use 30 mg pseudoephedrine 2-3 times daily for 5-6 days.  Ethinyl Estradiol; Etonogestrel vaginal ring What is this medicine? ETHINYL ESTRADIOL; ETONOGESTREL (ETH in il es tra DYE ole; et oh noe JES trel) vaginal ring is a flexible, vaginal ring used as a contraceptive (birth control method). This medicine combines two types of female hormones, an estrogen and a progestin. This ring is used to prevent ovulation and pregnancy. Each ring is effective for one month. This medicine may be used for other purposes; ask your health care provider or pharmacist if you have questions. COMMON BRAND NAME(S): NuvaRing What should I tell my health care provider before I take this medicine? They need to know if you have or ever had any of these conditions: -abnormal vaginal bleeding -blood vessel disease or blood clots -breast, cervical, endometrial, ovarian, liver, or uterine cancer -diabetes -gallbladder disease -heart disease or recent heart attack -high blood pressure -high cholesterol -kidney disease -liver disease -migraine headaches -stroke -systemic lupus erythematosus (SLE) -tobacco smoker -an unusual or allergic reaction to estrogens, progestins, other medicines, foods, dyes, or preservatives -pregnant or trying to get pregnant -breast-feeding How should I use this medicine? Insert the ring into your vagina as directed. Follow the directions on the prescription label. The ring will remain place for 3 weeks and is then removed for a 1-week break. A new ring is inserted 1 week after the last ring was removed, on the same day of the week. Do not use more often  than directed. A patient package insert for the product will be given with each prescription and refill. Read this sheet carefully each time. The sheet may change frequently. Contact your pediatrician regarding the use of this medicine in children. Special care may be needed. This medicine has been used in female children who have started having menstrual periods. Overdosage: If you think you have taken too much of this medicine contact a poison control center or emergency room at once. NOTE: This medicine is only for you. Do not share this medicine with others. What if I miss a dose? You will need to replace your vaginal ring once a month as directed. If the ring should slip out, or if you leave it in longer or shorter than you should, contact your health care professional for advice. What may interact with this medicine? -acetaminophen -antibiotics or medicines for infections, especially rifampin, rifabutin, rifapentine, and griseofulvin, and possibly penicillins or tetracyclines -aprepitant -ascorbic acid (vitamin C) -atorvastatin -barbiturate medicines, such as phenobarbital -bosentan -carbamazepine -caffeine -clofibrate -cyclosporine -dantrolene -doxercalciferol -felbamate -grapefruit juice -hydrocortisone -medicines for anxiety or sleeping problems, such as diazepam or temazepam -medicines for diabetes, including pioglitazone -modafinil -mycophenolate -nefazodone -oxcarbazepine -phenytoin -prednisolone -ritonavir or other medicines for HIV infection or AIDS -rosuvastatin -selegiline -soy isoflavones supplements -St. John's wort -tamoxifen or raloxifene -theophylline -thyroid hormones -topiramate -warfarin This list may not describe all possible interactions. Give your health care provider a list of all the medicines, herbs, non-prescription drugs, or dietary supplements you use. Also tell them if you smoke, drink alcohol, or use illegal drugs. Some items may interact  with your medicine. What should I watch for while using this medicine? Visit your doctor or health care professional for regular checks on your progress. You will need a regular breast and pelvic exam and Pap smear while on this medicine. Use an additional method of contraception during the first cycle that you use this ring. If you have any reason to think you are pregnant, stop using this medicine right away and contact your doctor or health care professional. If you are using this medicine for hormone related problems, it may take several cycles of use to see improvement in your condition. Smoking increases the risk of getting a blood clot or having a stroke while you are using hormonal birth control, especially if you are more than 28 years old. You are strongly advised not to smoke. This medicine can make your body retain fluid, making your fingers, hands, or ankles swell. Your blood pressure can go up. Contact your doctor or health care professional if you feel you are retaining fluid. This medicine can make you more sensitive to the sun. Keep out of the sun. If you cannot avoid being in the sun, wear protective clothing and use sunscreen. Do not use sun lamps or tanning beds/booths. If you wear contact lenses and notice visual changes, or if the lenses begin to feel uncomfortable, consult your eye care specialist. In some women, tenderness, swelling, or minor bleeding of the gums may occur. Notify your dentist if this happens. Brushing and flossing your teeth regularly may help limit this. See your dentist regularly and inform your dentist of the medicines you are taking. If you are going to have elective surgery, you may need to stop using this medicine before the surgery. Consult your health care professional for advice. This medicine does not protect you against HIV infection (AIDS) or any other sexually transmitted diseases. What side effects may I notice from receiving this medicine? Side  effects that you should report to your doctor or health care professional as soon as possible: -breast tissue changes or discharge -changes in vaginal bleeding during your period or between your periods -chest pain -coughing up blood -dizziness or fainting spells -headaches or migraines -leg, arm or groin pain -severe or sudden headaches -stomach pain (severe) -sudden shortness of breath -sudden loss of coordination, especially on one side of the body -speech problems -symptoms of vaginal infection like itching, irritation or unusual discharge -tenderness in the upper abdomen -vomiting -weakness or numbness in the arms or legs, especially on one side of the body -yellowing of the eyes or skin Side effects that usually do not require medical attention (report to your doctor or health care professional if they continue or are bothersome): -breakthrough bleeding and spotting that continues beyond the 3 initial cycles of pills -breast tenderness -mood changes, anxiety, depression, frustration, anger, or emotional outbursts -increased sensitivity to sun or ultraviolet light -nausea -skin rash, acne, or brown spots on the skin -weight gain (slight) This list may not describe all possible side effects. Call your doctor for medical advice about side effects. You may report side effects to FDA at 1-800-FDA-1088. Where should I keep my medicine? Keep out of the reach of children. Store at room temperature between 15 and 30 degrees C (59 and 86 degrees F) for up to 4 months. The product will expire after 4 months. Protect from light. Throw away any unused medicine after the expiration date. NOTE: This sheet is a summary. It may not cover all possible information. If  you have questions about this medicine, talk to your doctor, pharmacist, or health care provider.  2014, [employer] Standard. (2008-11-19 12:03:58)

## 2013-12-29 NOTE — Progress Notes (Signed)
Subjective:    Sandra Bauer is a 28 y.o. female who presents for contraception counseling. The patient is sexually active and has been using condoms. She will marry in 3 mos & desires an alternate form. She has no personal or family history of stroke. She is not a smoker. Pertinent past medical history: none. She does not think she will remember a pill & prefers a method that has low association with weight gain. She desires contraception for about 2 years.  In addition, she c/o nasal congestion for 3-5 days, denies fever, HA, or cough. Menstrual History: OB History   Grav Para Term Preterm Abortions TAB SAB Ect Mult Living   0                Patient's last menstrual period was 12/18/2013.    The following portions of the patient's history were reviewed and updated as appropriate: allergies, current medications, past family history, past medical history, past social history, past surgical history and problem list.  Review of Systems Constitutional: negative for fatigue, fevers and weight loss Ears, nose, mouth, throat, and face: positive for nasal congestion Respiratory: negative for cough and wheezing Cardiovascular: negative for chest pain, chest pressure/discomfort, irregular heart beat, lower extremity edema and near-syncope Genitourinary:negative, reports regular MC, no concerns Neurological: negative for headaches Behavioral/Psych: negative for anxiety, decreased appetite, excessive alcohol consumption, illegal drug usage and tobacco use   Objective:    BP 110/60  Pulse 63  Temp(Src) 98.4 F (36.9 C) (Oral)  Ht 5\' 1"  (1.549 m)  Wt 142 lb (64.411 kg)  BMI 26.84 kg/m2  SpO2 99%  LMP 12/18/2013 General appearance: alert, cooperative, appears stated age and no distress Head: Normocephalic, without obvious abnormality, atraumatic Eyes: negative findings: lids and lashes normal and conjunctivae and sclerae normal Ears: normal TM's and external ear canals both ears Nose:  Nares normal. Septum midline. Mucosa normal. No drainage or sinus tenderness. Throat: lips, mucosa, and tongue normal; teeth and gums normal Lungs: clear to auscultation bilaterally Heart: regular rate and rhythm, S1, S2 normal, no murmur, click, rub or gallop   Assessment:    28 y.o., starting NuvaRing vaginal inserts, no contraindications.  URI, nasal congestion Plan:    All questions answered. Agricultural engineerducational material distributed. Follow up in 2 months.  See instructions for URI.

## 2013-12-29 NOTE — Progress Notes (Signed)
Pre-visit discussion using our clinic review tool. No additional management support is needed unless otherwise documented below in the visit note.  

## 2014-01-26 ENCOUNTER — Encounter: Payer: Self-pay | Admitting: Nurse Practitioner

## 2014-01-26 ENCOUNTER — Ambulatory Visit (INDEPENDENT_AMBULATORY_CARE_PROVIDER_SITE_OTHER): Payer: Managed Care, Other (non HMO) | Admitting: Nurse Practitioner

## 2014-01-26 VITALS — BP 92/60 | HR 68 | Temp 98.2°F | Ht 61.0 in | Wt 142.0 lb

## 2014-01-26 DIAGNOSIS — Z3009 Encounter for other general counseling and advice on contraception: Secondary | ICD-10-CM

## 2014-01-26 NOTE — Patient Instructions (Signed)
If you decide to use a progesterone pill or injection, please call our office. If you decide to use an IUD or diaphragm, contact a gynecology office. There are many types of condoms on the market. Lamb skin condoms tend to feel the most natural.  Natural Family Planning Natural Family Planning (NFP) is a type of birth control without using any form of contraception. Women who use NFP should not have sexual intercourse when the ovary produces an egg (ovulation) during the menstrual cycle. The NFP method is safe and can prevent pregnancy. It is 75% effective when practiced right. The man needs to also understand this method of birth control and the woman needs to be aware of how her body functions during her menstrual cycle. NFP can also be used as a method of getting pregnant.  HOW THE NFP METHOD WORKS  A woman's menstrual period usually happens every 28 30 days (it can vary from 23 35 days).  Ovulation happens 12 14 days before the start of the next menstrual period (the fertile period). The egg is fertile for 24 hours and the sperm can live for 3 days or more. If there is sexual intercourse at this time, pregnancy can occur. THERE ARE MANY TYPES OF NFP METHODS USED TO PREVENT PREGNANCY  The basal body temperature method Often times, there is a slight increase of body temperature when a woman ovulates. Take your temperature every morning before getting out of bed. Write the temperature on a chart. An increase in the temperature shows ovulation has happened. Do not have sexual intercourse from the menstrual period up to three days after the increase in the temperature. Note that the body temperature may increase as a result of fever, restless sleep, and working schedules.  The ovulation cervical mucus method During the menstrual cycle, the cervical mucus changes from dry and sticky to wet and slippery. Check the mucus of the vagina every day to look for these changes. Just before ovulation, the mucus  becomes wet and slippery. On the last day of wetness, ovulation happens. To avoid getting pregnant, sexual intercourse is safe for about 10 days after the menstrual period and on the dry mucus days. Do not have sexual intercourse when the mucus starts to show up and not until 4 days after the wet and slippery mucus goes away. Sexual intercourse after the 4 days have passed until the menstrual period starts is a safe time. Note that the mucus from the vagina can increase because of a vaginal or cervical infection, lubricants, some medicines, and sexual excitement.  The symptothermal method This method uses both the temperature and the ovulation methods. Combine the two methods above to prevent pregnancy.  The calendar method Record your menstrual periods and length of the cycles for 6 months. This is helpful when the menstrual cycle varies in the length of the cycle. The length of a menstrual cycle is from day 1 of the present menstrual period to day 1 of the next menstrual period. Then, find your fertile days of the month and do not have sexual intercourse during that time. You may need help from your health care provider to find out your fertile days. There are some signs of ovulation that may be helpful when trying to find the time of ovulation. This includes vaginal spotting or abdominal cramps during the middle of your menstrual cycle. Not all women have these symptoms. YOU SHOULD NOT USE NFP IF:  You have very irregular menstrual periods and may skip  months.  You have abnormal bleeding.  You have a vaginal or cervical infection.  You are on medicines that can affect the vaginal mucus or body temperature. These medicines include antibiotics, thyroid medicines, and antihistamines (cold and allergy medicine). Document Released: 05/22/2008 Document Revised: 08/06/2013 Document Reviewed: 06/06/2013 [facility] Patient Information 2014 [place], [place].  Levonorgestrel intrauterine device (IUD) What is  this medicine? LEVONORGESTREL IUD (LEE voe nor jes trel) is a contraceptive (birth control) device. The device is placed inside the uterus by a healthcare professional. It is used to prevent pregnancy and can also be used to treat heavy bleeding that occurs during your period. Depending on the device, it can be used for 3 to 5 years. This medicine may be used for other purposes; ask your health care provider or pharmacist if you have questions. COMMON BRAND NAME(S): Gretta Cool What should I tell my health care provider before I take this medicine? They need to know if you have any of these conditions: -abnormal Pap smear -cancer of the breast, uterus, or cervix -diabetes -endometritis -genital or pelvic infection now or in the past -have more than one sexual partner or your partner has more than one partner -heart disease -history of an ectopic or tubal pregnancy -immune system problems -IUD in place -liver disease or tumor -problems with blood clots or take blood-thinners -use intravenous drugs -uterus of unusual shape -vaginal bleeding that has not been explained -an unusual or allergic reaction to levonorgestrel, other hormones, silicone, or polyethylene, medicines, foods, dyes, or preservatives -pregnant or trying to get pregnant -breast-feeding How should I use this medicine? This device is placed inside the uterus by a health care professional. Talk to your pediatrician regarding the use of this medicine in children. Special care may be needed. Overdosage: If you think you have taken too much of this medicine contact a poison control center or emergency room at once. NOTE: This medicine is only for you. Do not share this medicine with others. What if I miss a dose? This does not apply. What may interact with this medicine? Do not take this medicine with any of the following medications: -amprenavir -bosentan -fosamprenavir This medicine may also interact with the  following medications: -aprepitant -barbiturate medicines for inducing sleep or treating seizures -bexarotene -griseofulvin -medicines to treat seizures like carbamazepine, ethotoin, felbamate, oxcarbazepine, phenytoin, topiramate -modafinil -pioglitazone -rifabutin -rifampin -rifapentine -some medicines to treat HIV infection like atazanavir, indinavir, lopinavir, nelfinavir, tipranavir, ritonavir -St. John's wort -warfarin This list may not describe all possible interactions. Give your health care provider a list of all the medicines, herbs, non-prescription drugs, or dietary supplements you use. Also tell them if you smoke, drink alcohol, or use illegal drugs. Some items may interact with your medicine. What should I watch for while using this medicine? Visit your doctor or health care professional for regular check ups. See your doctor if you or your partner has sexual contact with others, becomes HIV positive, or gets a sexual transmitted disease. This product does not protect you against HIV infection (AIDS) or other sexually transmitted diseases. You can check the placement of the IUD yourself by reaching up to the top of your vagina with clean fingers to feel the threads. Do not pull on the threads. It is a good habit to check placement after each menstrual period. Call your doctor right away if you feel more of the IUD than just the threads or if you cannot feel the threads at all. The IUD may come out  by itself. You may become pregnant if the device comes out. If you notice that the IUD has come out use a backup birth control method like condoms and call your health care provider. Using tampons will not change the position of the IUD and are okay to use during your period. What side effects may I notice from receiving this medicine? Side effects that you should report to your doctor or health care professional as soon as possible: -allergic reactions like skin rash, itching or hives,  swelling of the face, lips, or tongue -fever, flu-like symptoms -genital sores -high blood pressure -no menstrual period for 6 weeks during use -pain, swelling, warmth in the leg -pelvic pain or tenderness -severe or sudden headache -signs of pregnancy -stomach cramping -sudden shortness of breath -trouble with balance, talking, or walking -unusual vaginal bleeding, discharge -yellowing of the eyes or skin Side effects that usually do not require medical attention (report to your doctor or health care professional if they continue or are bothersome): -acne -breast pain -change in sex drive or performance -changes in weight -cramping, dizziness, or faintness while the device is being inserted -headache -irregular menstrual bleeding within first 3 to 6 months of use -nausea This list may not describe all possible side effects. Call your doctor for medical advice about side effects. You may report side effects to FDA at 1-800-FDA-1088. Where should I keep my medicine? This does not apply. NOTE: This sheet is a summary. It may not cover all possible information. If you have questions about this medicine, talk to your doctor, pharmacist, or health care provider.  2014, Elsevier/Gold Standard. (2012-01-04 13:54:04)  Intrauterine Device Information An intrauterine device (IUD) is inserted into your uterus to prevent pregnancy. There are two types of IUDs available:   Copper IUD This type of IUD is wrapped in copper wire and is placed inside the uterus. Copper makes the uterus and fallopian tubes produce a fluid that kills sperm. The copper IUD can stay in place for 10 years.  Hormone IUD This type of IUD contains the hormone progestin (synthetic progesterone). The hormone thickens the cervical mucus and prevents sperm from entering the uterus. It also thins the uterine lining to prevent implantation of a fertilized egg. The hormone can weaken or kill the sperm that get into the uterus.  One type of hormone IUD can stay in place for 5 years, and another type can stay in place for 3 years. Your health care provider will make sure you are a good candidate for a contraceptive IUD. Discuss with your health care provider the possible side effects.  ADVANTAGES OF AN INTRAUTERINE DEVICE  IUDs are highly effective, reversible, long acting, and low maintenance.   There are no estrogen-related side effects.   An IUD can be used when breastfeeding.   IUDs are not associated with weight gain.   The copper IUD works immediately after insertion.   The hormone IUD works right away if inserted within 7 days of your period starting. You will need to use a backup method of birth control for 7 days if the hormone IUD is inserted at any other time in your cycle.  The copper IUD does not interfere with your female hormones.   The hormone IUD can make heavy menstrual periods lighter and decrease cramping.   The hormone IUD can be used for 3 or 5 years.   The copper IUD can be used for 10 years. DISADVANTAGES OF AN INTRAUTERINE DEVICE  The hormone IUD  can be associated with irregular bleeding patterns.   The copper IUD can make your menstrual flow heavier and more painful.   You may experience cramping and vaginal bleeding after insertion.  Document Released: 11/07/2004 Document Revised: 08/06/2013 Document Reviewed: 05/25/2013 [facility] Patient Information 2014 [place], [place].

## 2014-01-26 NOTE — Progress Notes (Signed)
Pre-visit discussion using our clinic review tool. No additional management support is needed unless otherwise documented below in the visit note.  

## 2014-01-27 NOTE — Progress Notes (Signed)
Subjective:    Sandra ShamMaria E Bauer is a 28 y.o. female who presents for f/u of contraception counseling. She tried Paediatric nurseuva Ring. She did not like side effects: bloating, vaginal burning, mood change, 2 nose bleeds, HA. Discontinued after 3 days. Had 8 day MC after removed.  The patient is not currently sexually active. She is planning to become sexually active in 3 mos when she marries. We again, discussed at length different options including barrier methods, natural family planning, hormonal options, and copper IUD.  Patient's last menstrual period was 01/14/2014.    The following portions of the patient's history were reviewed and updated as appropriate: allergies, current medications, past medical history, past social history and problem list.  Review of Systems Constitutional: negative for chills, fatigue and fevers Eyes: negative Respiratory: negative for cough and dyspnea on exertion Cardiovascular: negative for chest pain, lower extremity edema, near-syncope and palpitations Genitourinary:positive for vaginal burning, resolved since stopped nuva ring Neurological: positive for headaches and resolved since d/c nuva ring   Objective:    BP 92/60  Pulse 68  Temp(Src) 98.2 F (36.8 C) (Oral)  Ht 5\' 1"  (1.549 m)  Wt 142 lb (64.411 kg)  BMI 26.84 kg/m2  SpO2 98%  LMP 01/14/2014 General appearance: alert, cooperative, appears stated age and no distress Head: Normocephalic, without obvious abnormality, atraumatic Eyes: negative findings: lids and lashes normal and conjunctivae and sclerae normal Lungs: normal effort & rate Heart: nml rate   Assessment:    28 y.o., discontinuing NuvaRing vaginal inserts, due to unwanted SE.Marland Kitchen.   Plan:    See prob list A&P.

## 2014-01-27 NOTE — Assessment & Plan Note (Signed)
Unwanted SE of Nuva ring including HA, nose bleed, bloated, mood change. Discontinued. Thinks she wants to try Natural Family planning. Gave info.  Discussed barrier methods: condoms & diaphragm. Reviewed again: progesterone only methods, combined hormone pills, and IUD incl copper & preogesterone. Pt will contact gynecology if she wants IUD or diaphragm; contact me if she decides to use other methods.

## 2014-02-02 ENCOUNTER — Telehealth: Payer: Self-pay

## 2014-02-02 DIAGNOSIS — Z309 Encounter for contraceptive management, unspecified: Secondary | ICD-10-CM

## 2014-02-02 MED ORDER — NORETHINDRONE 0.35 MG PO TABS: 1.0000 | ORAL_TABLET | Freq: Every day | ORAL | Status: DC

## 2014-02-02 NOTE — Telephone Encounter (Signed)
Pt called and wanted a Rx for Progesterone pills. She was told to let Layne know if she wanted them. Please advise.

## 2014-02-02 NOTE — Telephone Encounter (Signed)
Pt wishes to try progesterone only pill for contraception. She had intolerable SE while using Nuva ring-nose bleeds, moodiness, bloating. Stressed importance of taking at same time every day. Advised pill is 93% effective if taken properly; may have no bleeding or unscheduled bleeding & spotting as long as she is taking med. May experience nausea, breast tenderness, HA. If misses a pill, take ASAP, then take next pill at scheduled time. Use condoms for next 48 hours. Fertility is immediately re-established once pill is stopped.

## 2014-02-04 NOTE — Telephone Encounter (Signed)
Pt notified of rx. 

## 2014-02-09 ENCOUNTER — Ambulatory Visit: Payer: Managed Care, Other (non HMO) | Admitting: Nurse Practitioner

## 2014-11-26 ENCOUNTER — Encounter: Payer: Self-pay | Admitting: Family Medicine

## 2014-11-26 ENCOUNTER — Ambulatory Visit (INDEPENDENT_AMBULATORY_CARE_PROVIDER_SITE_OTHER): Payer: Managed Care, Other (non HMO) | Admitting: Family Medicine

## 2014-11-26 VITALS — BP 127/84 | HR 80 | Temp 99.2°F | Resp 16 | Ht 61.0 in | Wt 159.0 lb

## 2014-11-26 DIAGNOSIS — J0140 Acute pansinusitis, unspecified: Secondary | ICD-10-CM

## 2014-11-26 MED ORDER — NEOMYCIN-POLYMYXIN-HC 1 % OT SOLN: OTIC | Status: DC

## 2014-11-26 MED ORDER — FLUTICASONE PROPIONATE 50 MCG/ACT NA SUSP: NASAL | Status: DC

## 2014-11-26 MED ORDER — CEPHALEXIN 500 MG PO CAPS: ORAL_CAPSULE | ORAL | Status: DC

## 2014-11-26 NOTE — Progress Notes (Signed)
OFFICE NOTE  11/26/2014  CC:  Chief Complaint  Patient presents with  . Facial Pain    x 5 days  . Ear Fullness  . Medication Refill    ear drops and nasal spray   HPI: Patient is a 28 y.o. Hispanic female who is here for facial pain and ear fullness. Over 1 week of nasal congestion, thick mucous that is yellow/green/blood tinged.  Pressure in head and face.   No cough, no ST, no fever.    Pertinent PMH:  Past surgical, social, and family history reviewed and no changes noted since last office visit.  MEDS:  Outpatient Prescriptions Prior to Visit  Medication Sig Dispense Refill  . citalopram (CELEXA) 10 MG tablet Take 1 tablet (10 mg total) by mouth daily. 30 tablet 6  . fluticasone (FLONASE) 50 MCG/ACT nasal spray USE 2 SPRAYS INTO THE NOSE DAILY. 16 g 5  . montelukast (SINGULAIR) 10 MG tablet Take 1 tablet (10 mg total) by mouth at bedtime. 30 tablet 6  . NEOMYCIN-POLYMYXIN-HC, OTIC, (CORTISPORIN) 1 % SOLN 3 drops in each ear every 8 hours x 10d 1 Bottle 2  . norethindrone (MICRONOR,CAMILA,ERRIN) 0.35 MG tablet Take 1 tablet (0.35 mg total) by mouth daily. Start on first day of menses. 1 Package 11  . fexofenadine (ALLEGRA) 180 MG tablet Take 1 tablet (180 mg total) by mouth daily. 30 tablet 11   No facility-administered medications prior to visit.    PE: Blood pressure 127/84, pulse 80, temperature 99.2 F (37.3 C), temperature source Temporal, resp. rate 16, height 5\' 1"  (1.549 m), weight 159 lb (72.122 kg), last menstrual period 11/04/2014, SpO2 100 %. VS: noted--normal. Gen: alert, NAD, NONTOXIC APPEARING. HEENT: eyes without injection, drainage, or swelling.  Ears: EACs clear, TMs with normal light reflex and landmarks.  Nose: Clear rhinorrhea, with some dried, crusty exudate adherent to mildly injected mucosa.  No purulent d/c.  Diffuse paranasal sinus TTP.  No facial swelling.  Throat and mouth without focal lesion.  No pharyngial swelling, erythema, or exudate.    Neck: supple, no LAD.   LUNGS: CTA bilat, nonlabored resps.   CV: RRR, no m/r/g. EXT: no c/c/e SKIN: no rash   IMPRESSION AND PLAN:  Acute pansinusitis. Keflex 500mg  tid x 10d. Saline nasal spray. Continue all other current symptomatic meds. Use back up birth control method or abstinence while on antibiotic.  An After Visit Summary was printed and given to the patient.  FOLLOW UP: prn

## 2014-11-26 NOTE — Progress Notes (Signed)
Pre visit review using our clinic review tool, if applicable. No additional management support is needed unless otherwise documented below in the visit note. 

## 2014-11-26 NOTE — Patient Instructions (Signed)
Buy OTC generic saline nasal spray and use 2-3 sprays in each nostril several times per day to irrigate and moisturize nasal passages.

## 2015-01-13 ENCOUNTER — Other Ambulatory Visit: Payer: Self-pay | Admitting: *Deleted

## 2015-01-13 DIAGNOSIS — Z30011 Encounter for initial prescription of contraceptive pills: Secondary | ICD-10-CM

## 2015-01-13 MED ORDER — NORETHINDRONE 0.35 MG PO TABS: 1.0000 | ORAL_TABLET | Freq: Every day | ORAL | Status: DC

## 2015-03-15 ENCOUNTER — Other Ambulatory Visit: Payer: Self-pay | Admitting: Family Medicine

## 2015-05-27 ENCOUNTER — Ambulatory Visit (INDEPENDENT_AMBULATORY_CARE_PROVIDER_SITE_OTHER): Payer: Managed Care, Other (non HMO) | Admitting: Family Medicine

## 2015-05-27 ENCOUNTER — Encounter: Payer: Self-pay | Admitting: Family Medicine

## 2015-05-27 VITALS — BP 121/77 | HR 66 | Temp 98.4°F | Resp 16 | Wt 164.0 lb

## 2015-05-27 DIAGNOSIS — F410 Panic disorder [episodic paroxysmal anxiety] without agoraphobia: Secondary | ICD-10-CM | POA: Diagnosis not present

## 2015-05-27 DIAGNOSIS — F329 Major depressive disorder, single episode, unspecified: Secondary | ICD-10-CM

## 2015-05-27 DIAGNOSIS — F32A Depression, unspecified: Secondary | ICD-10-CM

## 2015-05-27 DIAGNOSIS — F4010 Social phobia, unspecified: Secondary | ICD-10-CM | POA: Diagnosis not present

## 2015-05-27 DIAGNOSIS — F411 Generalized anxiety disorder: Secondary | ICD-10-CM

## 2015-05-27 MED ORDER — DULOXETINE HCL 20 MG PO CPEP: ORAL_CAPSULE | ORAL | Status: DC

## 2015-05-27 NOTE — Progress Notes (Signed)
Pre visit review using our clinic review tool, if applicable. No additional management support is needed unless otherwise documented below in the visit note. 

## 2015-05-27 NOTE — Progress Notes (Signed)
OFFICE NOTE  05/27/2015  CC:  Chief Complaint  Patient presents with  . Follow-up    Anxiety. Pt is not fasting.     HPI: Patient is a 29 y.o. [place] female who is here for f/u anxiety d/o. She is still having lots of generalized anxiety, worry, irritability, poor concentration, feels keyed up a lot but energy level. Recently felt overwhelmed with anxiety and emotion recently, started crying uncontrollably.   Says citalopram was oversedating even at 1/2 of 10mg  dose qd.   Some depressed mood more lately, poor motivation to get out of bed some mornings, isolates more. Describes social anxiety as well.  No external factors seem to be contributing: marriage and job are fine.  ROS: some HA's, LMP: current and these occur very regularly.  Pertinent PMH:  Past medical, surgical, social, and family history reviewed and no changes are noted since last office visit.  MEDS:  Outpatient Prescriptions Prior to Visit  Medication Sig Dispense Refill  . citalopram (CELEXA) 10 MG tablet Take 1 tablet (10 mg total) by mouth daily. 30 tablet 6  . fluticasone (FLONASE) 50 MCG/ACT nasal spray USE 2 SPRAYS INTO THE NOSE DAILY. 16 g 11  . KLS ALLER-FEX 180 MG tablet TAKE 1 TABLET (180 MG TOTAL) BY MOUTH DAILY. 120 tablet PRN  . montelukast (SINGULAIR) 10 MG tablet Take 1 tablet (10 mg total) by mouth at bedtime. 30 tablet 6  . NEOMYCIN-POLYMYXIN-HYDROCORTISONE (CORTISPORIN) 1 % SOLN otic solution 3 drops in each ear every 8 hours x 10d 1 Bottle 6  . norethindrone (MICRONOR,CAMILA,ERRIN) 0.35 MG tablet Take 1 tablet (0.35 mg total) by mouth daily. Start on first day of menses. 1 Package 11  . cephALEXin (KEFLEX) 500 MG capsule 1 cap po tid x 10d (Patient not taking: Reported on 05/27/2015) 30 capsule 0   No facility-administered medications prior to visit.    PE: Blood pressure 121/77, pulse 66, temperature 98.4 F (36.9 C), temperature source Oral, resp. rate 16, weight 164 lb (74.39 kg), SpO2  100 %. Wt Readings from Last 2 Encounters:  05/27/15 164 lb (74.39 kg)  11/26/14 159 lb (72.122 kg)    Gen: alert, oriented x 4, affect pleasant.  Lucid thinking and conversation noted. HEENT: PERRLA, EOMI.   Neck: no LAD, mass, or thyromegaly. CV: RRR, no m/r/g LUNGS: CTA bilat, nonlabored. NEURO: no tremor or tics noted on observation.  Coordination intact. CN 2-12 grossly intact bilaterally, strength 5/5 in all extremeties.  No ataxia.   IMPRESSION AND PLAN:  GAD, social anxiety d/o, depression. Will do trial of cymbalta, start at 20mg  qhs and titrate up q2 weeks to 60mg  per day. Also clonaz 0.5mg , 1-2 q8h prn, #30, no RF. She wants to think about my offer of counseling referral for now.  An After Visit Summary was printed and given to the patient.  FOLLOW UP: 1 mo

## 2015-06-07 ENCOUNTER — Other Ambulatory Visit: Payer: Self-pay | Admitting: Family Medicine

## 2015-06-07 NOTE — Telephone Encounter (Signed)
RF request for camilla.   RX written 01/13/15 x 11 rfs.  RX should already be at pharmacy.  Pharmacy aware.

## 2015-06-24 ENCOUNTER — Telehealth: Payer: Self-pay | Admitting: Family Medicine

## 2015-06-24 ENCOUNTER — Encounter: Payer: Self-pay | Admitting: Family Medicine

## 2015-06-24 ENCOUNTER — Encounter: Payer: Self-pay | Admitting: *Deleted

## 2015-06-24 ENCOUNTER — Ambulatory Visit (INDEPENDENT_AMBULATORY_CARE_PROVIDER_SITE_OTHER): Payer: Managed Care, Other (non HMO) | Admitting: Family Medicine

## 2015-06-24 VITALS — BP 129/92 | HR 98 | Temp 98.7°F | Resp 16 | Wt 164.0 lb

## 2015-06-24 DIAGNOSIS — F329 Major depressive disorder, single episode, unspecified: Secondary | ICD-10-CM

## 2015-06-24 DIAGNOSIS — R519 Headache, unspecified: Secondary | ICD-10-CM

## 2015-06-24 DIAGNOSIS — F4010 Social phobia, unspecified: Secondary | ICD-10-CM | POA: Insufficient documentation

## 2015-06-24 DIAGNOSIS — H578 Other specified disorders of eye and adnexa: Secondary | ICD-10-CM | POA: Diagnosis not present

## 2015-06-24 DIAGNOSIS — R51 Headache: Secondary | ICD-10-CM

## 2015-06-24 DIAGNOSIS — F32A Depression, unspecified: Secondary | ICD-10-CM | POA: Insufficient documentation

## 2015-06-24 DIAGNOSIS — F411 Generalized anxiety disorder: Secondary | ICD-10-CM

## 2015-06-24 DIAGNOSIS — H5789 Other specified disorders of eye and adnexa: Secondary | ICD-10-CM

## 2015-06-24 MED ORDER — CLONAZEPAM 0.5 MG PO TABS: ORAL_TABLET | ORAL | Status: DC

## 2015-06-24 NOTE — Telephone Encounter (Signed)
[facility] and the physician that she saw said her eye problem was either stress or a scratch.   Pt is to call if it gets worse.  Pressure looked good per physician.

## 2015-06-24 NOTE — Progress Notes (Signed)
Pre visit review using our clinic review tool, if applicable. No additional management support is needed unless otherwise documented below in the visit note. 

## 2015-06-24 NOTE — Progress Notes (Signed)
OFFICE NOTE  06/24/2015  CC:  Chief Complaint  Patient presents with  . Follow-up    4 week f/u. Pt is not fasting.     HPI: Patient is a 29 y.o. [place] female who is here for 1 mo f/u anx/dep. Started cymbalta last visit, along with prn clonazepam. She is improved, has gotten up to the 40 mg dosing.  She notes that most of her severe emotional and her physical sx's are worse when at work.  Last few days HA on L side, also noted redness of L eye sclera today.  Says vision in L eye a little blurry (wearing spectacles today, not her contacts).  No itching of eye.  Eyeball/orbit is not painful, but L hemicranium is hurting some.  Pertinent PMH:  Past medical, surgical, social, and family history reviewed and no changes are noted since last office visit.  MEDS:  Outpatient Prescriptions Prior to Visit  Medication Sig Dispense Refill  . citalopram (CELEXA) 10 MG tablet Take 1 tablet (10 mg total) by mouth daily. 30 tablet 6  . DULoxetine (CYMBALTA) 20 MG capsule 1-3 tabs po qhs 90 capsule 1  . fluticasone (FLONASE) 50 MCG/ACT nasal spray USE 2 SPRAYS INTO THE NOSE DAILY. 16 g 11  . KLS ALLER-FEX 180 MG tablet TAKE 1 TABLET (180 MG TOTAL) BY MOUTH DAILY. 120 tablet PRN  . montelukast (SINGULAIR) 10 MG tablet Take 1 tablet (10 mg total) by mouth at bedtime. 30 tablet 6  . NEOMYCIN-POLYMYXIN-HYDROCORTISONE (CORTISPORIN) 1 % SOLN otic solution 3 drops in each ear every 8 hours x 10d 1 Bottle 6  . norethindrone (MICRONOR,CAMILA,ERRIN) 0.35 MG tablet Take 1 tablet (0.35 mg total) by mouth daily. Start on first day of menses. 1 Package 11   No facility-administered medications prior to visit.    PE: Blood pressure 129/92, pulse 98, temperature 98.7 F (37.1 C), temperature source Oral, resp. rate 16, weight 164 lb (74.39 kg), SpO2 100 %. Gen: Alert, well appearing.  Patient is oriented to person, place, time, and situation. No exudate.  No palpebral erythema or swelling. Left eye  sclera with splotchy hemorrhages diffusely, mostly clustered in superior 1/2, though. Iris and pupils appear normal.  PERRLA.  EOMI but this causes her to feel a little dizzy.   Fundoscopy: normal retinal vasculature.  I was unable to get an adequate look at her optic discs.   Visual Acuity Screening   Right eye Left eye Both eyes  Without correction:     With correction: 20/30 20/25 20/20      IMPRESSION AND PLAN:  1) Depression/anxiety: improved, needs to change timing of dose of cymbalta to morning since it activates her. Clonaz rx : forgot to give this to her last visit.  Gave rx today, see orders.  2) L eye red/scleral hemorrhages: with unilateral HA but not temporal artery tenderness. I recommended she see her eye doctor today to get further eval for possible glaucoma.  Spent 30 min with pt today, with >50% of this time spent in counseling and care coordination regarding the above problems.  An After Visit Summary was printed and given to the patient.  FOLLOW UP: 2 mo

## 2015-06-24 NOTE — Telephone Encounter (Signed)
Ok then i need her to tell me the dates she missed.

## 2015-06-24 NOTE — Telephone Encounter (Signed)
She needs to send me FMLA papers like we talked about at her appt today.-thx

## 2015-06-24 NOTE — Telephone Encounter (Signed)
Patient said she didn't need FMLA.  She just wanted letter instead?

## 2015-06-24 NOTE — Telephone Encounter (Signed)
Patient also states that she needs a physician note stating that she missed work due to her anxiety / panic attacks.  Please advise.

## 2015-06-25 ENCOUNTER — Ambulatory Visit: Payer: Managed Care, Other (non HMO) | Admitting: Family Medicine

## 2015-07-05 DIAGNOSIS — Z0279 Encounter for issue of other medical certificate: Secondary | ICD-10-CM

## 2015-08-10 ENCOUNTER — Encounter: Payer: Self-pay | Admitting: Family Medicine

## 2015-08-10 ENCOUNTER — Encounter: Payer: Self-pay | Admitting: *Deleted

## 2015-08-10 ENCOUNTER — Ambulatory Visit (INDEPENDENT_AMBULATORY_CARE_PROVIDER_SITE_OTHER): Payer: Managed Care, Other (non HMO) | Admitting: Family Medicine

## 2015-08-10 VITALS — BP 132/90 | HR 92 | Temp 98.5°F | Resp 16 | Wt 165.0 lb

## 2015-08-10 DIAGNOSIS — M542 Cervicalgia: Secondary | ICD-10-CM

## 2015-08-10 DIAGNOSIS — H6091 Unspecified otitis externa, right ear: Secondary | ICD-10-CM | POA: Diagnosis not present

## 2015-08-10 DIAGNOSIS — H6092 Unspecified otitis externa, left ear: Secondary | ICD-10-CM | POA: Insufficient documentation

## 2015-08-10 MED ORDER — CYCLOBENZAPRINE HCL 5 MG PO TABS: 5.0000 mg | ORAL_TABLET | Freq: Three times a day (TID) | ORAL | Status: DC | PRN

## 2015-08-10 MED ORDER — FLUTICASONE PROPIONATE 50 MCG/ACT NA SUSP: 2.0000 | Freq: Every day | NASAL | Status: DC

## 2015-08-10 MED ORDER — CIPROFLOXACIN-DEXAMETHASONE 0.3-0.1 % OT SUSP: 4.0000 [drp] | Freq: Two times a day (BID) | OTIC | Status: DC

## 2015-08-10 NOTE — Progress Notes (Signed)
Pre visit review using our clinic review tool, if applicable. No additional management support is needed unless otherwise documented below in the visit note. 

## 2015-08-10 NOTE — Progress Notes (Addendum)
Subjective:    Patient ID: Sandra Bauer, female    DOB: 1986/06/02, 29 y.o.   MRN: [id number]  HPI  Left ear pain: Patient presents to acute office visit for left ear pain that started 5-6 days ago. She was seen at the urgent care 4-5 days ago, and treated with Augmentin, Polytrim drops. Patient states that her right ear started hurting last night. Initially (6 days ago) ,  she had fever and chills but has not felt feverish since. She is tolerating food and liquids well, no nausea, vomiting or diarrhea. Patient states if anything she feels constipated. She has leftover Norco prescription from wisdom tooth extraction, she states she needed to use one of those in order to help with the pain because Aleve was not working for her. She endorses left-sided neck pain, but states that it is improved and feels that it is muscle related because she did not want to sleep on that side of her ear, she felt she slept wrong on her neck. Patient does endorse swimming last week prior to onset of ear pain.  Never smoker Past Medical History  Diagnosis Date  . Seasonal allergic rhinitis   . GAD (generalized anxiety disorder)    Allergies  Allergen Reactions  . Nuvaring [Etonogestrel-Ethinyl Estradiol]     Vaginal burning, HA, bloating, mood change, nose bleed   Past Surgical History  Procedure Laterality Date  . Wisdom tooth extraction  2 YEARS AGO   Social History   Social History  . Marital Status: Single    Spouse Name: N/A  . Number of Children: N/A  . Years of Education: N/A   Occupational History  . Not on file.   Social History Main Topics  . Smoking status: Never Smoker   . Smokeless tobacco: Never Used  . Alcohol Use: No  . Drug Use: No  . Sexual Activity: Yes    Birth Control/ Protection: Condom   Other Topics Concern  . Not on file   Social History Narrative   Married 2015.   Orig from [place], lives in [place], [place].   Has three younger sisters in [place].   Attending  nursing school, works full time at [employer] in [place].   No T/A/Ds.    Review of Systems Negative, with the exception of above mentioned in HPI     Objective:   Physical Exam BP 132/90 mmHg  Pulse 92  Temp(Src) 98.5 F (36.9 C) (Oral)  Resp 16  Wt 165 lb (74.844 kg)  SpO2 99% Gen: Afebrile. No acute distress. Nontoxic in appearance, appears fatigued, well-developed, well-nourished. HENT: AT. [place]. Right TM visualized and normal, mild swelling, exudate/drainage. Left TM unable to be viuslized 2/2 to severe swelling, drainage/exudative of external auditory canal.  MMM. Bilateral nares no erythema or swelling. Throat without erythema or exudates. No cough or hoarseness on exam Eyes:Pupils Equal Round Reactive to light, Extraocular movements intact,  Conjunctiva with punctate hemorrhage, otherwise no injections, redness, discharge or icterus. Neck: Supple,left posterior cervical lymphadenopathy.  CV: RRR  Chest: CTAB, no wheeze or crackles MSK: no erythema, palpable posterior cervical lymphadenopathy . left-sided neck muscle tightness posterior scalene. Mildly tender to palpation posterior auricular area and mastoid. Negative kernigs. Decrease ROM left rotation only.  Skin: no rashes, purpura or petechiae.      Assessment & Plan:  Sandra Bauer is a 29 y.o. female Presents for worsening left ear pain, and new onset right ear pain. Mild concern for spreading infection/mastoiditis. However patient  does not appear to be quite that ill.  - Continue Augmentin, Flonase prescribed, Ciprodex drops bilateral ears.  - neck: concern for spreading infection vs MSK, in-depth discussion on red flags for patient to report immediately to either this office or if after-hours emergency room. If no improvement in 2 days, or acutely worsening with systemic systems will need to either send to ENT and/or CT scan neck/mastoid.  Constipation: Patient encouraged to use on Miralax and 8 ounces of water daily, until  regular bowel movement. Patient to follow up in 1 week if no improvement or increased, pain , bleeding, nausea, vomit.

## 2015-08-10 NOTE — Patient Instructions (Signed)
I have called in a new prescription, and drops, then I want you to start as soon as possible. Discontinue the prior eardrops you had. He will use this new ear drop 2 times a day, 4 drops, for 7 days. Continue taking the oral antibiotics you were given. Caution with using narcotic pain medication, it can cause drowsiness and constipation. This is what she were taking for pain prior. I have also called in Flexeril, this is a muscle relaxer but can also cause some sedation. Please use a heating pad to help with the neck pain. If this neck pain increases, becomes more swollen, becomes red, he spiked a fever, become nausea or vomiting he would need to be seen right away, even the emergency room if necessary. I have some concerns about spreading infection, and we may end up needing to do a scan of your neck and head if this doesn't resolve. I want to follow-up with you on Friday, even if you are feeling better.

## 2015-08-13 ENCOUNTER — Ambulatory Visit (INDEPENDENT_AMBULATORY_CARE_PROVIDER_SITE_OTHER): Payer: Managed Care, Other (non HMO) | Admitting: Family Medicine

## 2015-08-13 ENCOUNTER — Encounter: Payer: Self-pay | Admitting: Family Medicine

## 2015-08-13 VITALS — BP 114/79 | HR 94 | Temp 99.3°F | Resp 18 | Wt 168.0 lb

## 2015-08-13 DIAGNOSIS — H6092 Unspecified otitis externa, left ear: Secondary | ICD-10-CM | POA: Diagnosis not present

## 2015-08-13 DIAGNOSIS — H6091 Unspecified otitis externa, right ear: Secondary | ICD-10-CM | POA: Diagnosis not present

## 2015-08-13 DIAGNOSIS — M542 Cervicalgia: Secondary | ICD-10-CM

## 2015-08-13 NOTE — Patient Instructions (Signed)
You are looking better. Continue medications as directed, Ciprodex drops for a total 14 days.  I want to see you next week (end of) and check on you one more time. If you still have signs of infection or not feeling much improved will send to ENT.

## 2015-08-13 NOTE — Progress Notes (Signed)
Subjective:    Patient ID: Sandra Bauer, female    DOB: 02/12/1986, 29 y.o.   MRN: [id number]  HPI  Bilateral External otitis infection: Patient presents for follow-up today on her bilateral external otitis. She has continued her Augmentin, and started the Ciprodex and Flonase. She reports that she is feeling much better. She feels her left ear is much improved. She does admit however that her right ear hurts a little bit more than it did prior. She had some mild nausea 1, but no vomiting. She is eating and drinking well. She has been more sleepy, but she has also been taking the Flexeril for her neck discomfort. She denies any fever or chills. She has been resting, taking in plenty of fluids and hasn't needed any NSAIDs for 2 days.  Left-sided neck pain: Patient has been taking Flexeril for her left-sided neck pain. She states her neck feels much better, her range of motion has returned. She is now able to sleep on that side without discomfort. She has noticed some sedation side effects with the Flexeril.  Past Medical History  Diagnosis Date  . Seasonal allergic rhinitis   . GAD (generalized anxiety disorder)    Allergies  Allergen Reactions  . Nuvaring [Etonogestrel-Ethinyl Estradiol]     Vaginal burning, HA, bloating, mood change, nose bleed   Past Surgical History  Procedure Laterality Date  . Wisdom tooth extraction  2 YEARS AGO   Family History  Problem Relation Age of Onset  . Cancer Maternal Grandfather     "stomach"  . Diabetes Paternal Grandfather   . Stroke Paternal Grandfather    Social History   Social History  . Marital Status: Single    Spouse Name: N/A  . Number of Children: N/A  . Years of Education: N/A   Occupational History  . Not on file.   Social History Main Topics  . Smoking status: Never Smoker   . Smokeless tobacco: Never Used  . Alcohol Use: No  . Drug Use: No  . Sexual Activity: Yes    Birth Control/ Protection: Condom   Other  Topics Concern  . Not on file   Social History Narrative   Married 2015.   Orig from [place], lives in [place], [place].   Has three younger sisters in [place].   Attending nursing school, works full time at [employer] in [place].   No T/A/Ds.    Review of Systems Negative, with the exception of above mentioned in HPI     Objective:   Physical Exam BP 114/79 mmHg  Pulse 94  Temp(Src) 99.3 F (37.4 C) (Oral)  Resp 18  Wt 168 lb (76.204 kg)  SpO2 100% Gen: Low-grade temp today. No acute distress. Nontoxic in appearance, well-developed, well-nourished, appears well. HENT: AT. [place]. Mild tenderness to palpation posterior auricular bilaterally. Bilateral TM visualized and normal in appearance. Left external auditory canal with mild swelling and drainage. Right external auditory canal with mild swelling and drainage. MMM. Bilateral nares with no erythema or swelling. Throat without erythema or exudates. Cobblestoning present. No cough or hoarseness on exam.  Eyes:Pupils Equal Round Reactive to light, Extraocular movements intact,Conjunctiva without redness, discharge or icterus. Neck: Supple, mild posterior auricular lymphadenopathy (much improved) CV: RRR No murmur  Chest: CTAB, no wheeze or crackles    Assessment & Plan:  Sandra Bauer is a 29 y.o. female presents to follow-up to external otitis bilaterally.  1. External otitis of left ear/ External otitis of right ear: -  Patient's left ear looks much improved, right ear looks about the same today. Lymphadenopathy has decreased. Patient is responding well to medications. - Continue Augmentin to completion, continue Flonase, continue Ciprodex for a total 14 days of treatment. - Patient still with low-grade temperature today, although looks much improved. May return to work tomorrow, and follow up again next week. If ear infection hasn't completely resolved at that point, or patient is worsening with send to ENT for evaluation.  3. Neck pain on  left side - Patient's neck pain is much improved. Flexeril as needed only. Continue antibiotics use.  Patient to follow-up next week

## 2015-08-24 ENCOUNTER — Encounter: Payer: Self-pay | Admitting: Family Medicine

## 2015-08-24 ENCOUNTER — Ambulatory Visit (INDEPENDENT_AMBULATORY_CARE_PROVIDER_SITE_OTHER): Payer: Managed Care, Other (non HMO) | Admitting: Family Medicine

## 2015-08-24 VITALS — BP 129/85 | HR 85 | Temp 98.6°F | Resp 18 | Ht 61.0 in | Wt 167.0 lb

## 2015-08-24 DIAGNOSIS — N912 Amenorrhea, unspecified: Secondary | ICD-10-CM

## 2015-08-24 DIAGNOSIS — H6091 Unspecified otitis externa, right ear: Secondary | ICD-10-CM

## 2015-08-24 DIAGNOSIS — H6092 Unspecified otitis externa, left ear: Secondary | ICD-10-CM

## 2015-08-24 DIAGNOSIS — Z09 Encounter for follow-up examination after completed treatment for conditions other than malignant neoplasm: Secondary | ICD-10-CM

## 2015-08-24 DIAGNOSIS — M542 Cervicalgia: Secondary | ICD-10-CM | POA: Diagnosis not present

## 2015-08-24 DIAGNOSIS — Z3201 Encounter for pregnancy test, result positive: Secondary | ICD-10-CM | POA: Insufficient documentation

## 2015-08-24 LAB — POCT URINE PREGNANCY: Preg Test, Ur: POSITIVE — AB

## 2015-08-24 NOTE — Patient Instructions (Signed)
First Trimester of Pregnancy The first trimester of pregnancy is from week 1 until the end of week 12 (months 1 through 3). A week after a sperm fertilizes an egg, the egg will implant on the wall of the uterus. This embryo will begin to develop into a baby. Genes from you and your partner are forming the baby. The female genes determine whether the baby is a boy or a girl. At 6-8 weeks, the eyes and face are formed, and the heartbeat can be seen on ultrasound. At the end of 12 weeks, all the baby's organs are formed.  Now that you are pregnant, you will want to do everything you can to have a healthy baby. Two of the most important things are to get good prenatal care and to follow your health care provider's instructions. Prenatal care is all the medical care you receive before the baby's birth. This care will help prevent, find, and treat any problems during the pregnancy and childbirth. BODY CHANGES Your body goes through many changes during pregnancy. The changes vary from woman to woman.   You may gain or lose a couple of pounds at first.  You may feel sick to your stomach (nauseous) and throw up (vomit). If the vomiting is uncontrollable, call your health care provider.  You may tire easily.  You may develop headaches that can be relieved by medicines approved by your health care provider.  You may urinate more often. Painful urination may mean you have a bladder infection.  You may develop heartburn as a result of your pregnancy.  You may develop constipation because certain hormones are causing the muscles that push waste through your intestines to slow down.  You may develop hemorrhoids or swollen, bulging veins (varicose veins).  Your breasts may begin to grow larger and become tender. Your nipples may stick out more, and the tissue that surrounds them (areola) may become darker.  Your gums may bleed and may be sensitive to brushing and flossing.  Dark spots or blotches (chloasma,  mask of pregnancy) may develop on your face. This will likely fade after the baby is born.  Your menstrual periods will stop.  You may have a loss of appetite.  You may develop cravings for certain kinds of food.  You may have changes in your emotions from day to day, such as being excited to be pregnant or being concerned that something may go wrong with the pregnancy and baby.  You may have more vivid and strange dreams.  You may have changes in your hair. These can include thickening of your hair, rapid growth, and changes in texture. Some women also have hair loss during or after pregnancy, or hair that feels dry or thin. Your hair will most likely return to normal after your baby is born. WHAT TO EXPECT AT YOUR PRENATAL VISITS During a routine prenatal visit:  You will be weighed to make sure you and the baby are growing normally.  Your blood pressure will be taken.  Your abdomen will be measured to track your baby's growth.  The fetal heartbeat will be listened to starting around week 10 or 12 of your pregnancy.  Test results from any previous visits will be discussed. Your health care provider may ask you:  How you are feeling.  If you are feeling the baby move.  If you have had any abnormal symptoms, such as leaking fluid, bleeding, severe headaches, or abdominal cramping.  If you have any questions. Other tests   that may be performed during your first trimester include:  Blood tests to find your blood type and to check for the presence of any previous infections. They will also be used to check for low iron levels (anemia) and Rh antibodies. Later in the pregnancy, blood tests for diabetes will be done along with other tests if problems develop.  Urine tests to check for infections, diabetes, or protein in the urine.  An ultrasound to confirm the proper growth and development of the baby.  An amniocentesis to check for possible genetic problems.  Fetal screens for  spina bifida and Down syndrome.  You may need other tests to make sure you and the baby are doing well. HOME CARE INSTRUCTIONS  Medicines  Follow your health care provider's instructions regarding medicine use. Specific medicines may be either safe or unsafe to take during pregnancy.  Take your prenatal vitamins as directed.  If you develop constipation, try taking a stool softener if your health care provider approves. Diet  Eat regular, well-balanced meals. Choose a variety of foods, such as meat or vegetable-based protein, fish, milk and low-fat dairy products, vegetables, fruits, and whole grain breads and cereals. Your health care provider will help you determine the amount of weight gain that is right for you.  Avoid raw meat and uncooked cheese. These carry germs that can cause birth defects in the baby.  Eating four or five small meals rather than three large meals a day may help relieve nausea and vomiting. If you start to feel nauseous, eating a few soda crackers can be helpful. Drinking liquids between meals instead of during meals also seems to help nausea and vomiting.  If you develop constipation, eat more high-fiber foods, such as fresh vegetables or fruit and whole grains. Drink enough fluids to keep your urine clear or pale yellow. Activity and Exercise  Exercise only as directed by your health care provider. Exercising will help you:  Control your weight.  Stay in shape.  Be prepared for labor and delivery.  Experiencing pain or cramping in the lower abdomen or low back is a good sign that you should stop exercising. Check with your health care provider before continuing normal exercises.  Try to avoid standing for long periods of time. Move your legs often if you must stand in one place for a long time.  Avoid heavy lifting.  Wear low-heeled shoes, and practice good posture.  You may continue to have sex unless your health care provider directs you  otherwise. Relief of Pain or Discomfort  Wear a good support bra for breast tenderness.   Take warm sitz baths to soothe any pain or discomfort caused by hemorrhoids. Use hemorrhoid cream if your health care provider approves.   Rest with your legs elevated if you have leg cramps or low back pain.  If you develop varicose veins in your legs, wear support hose. Elevate your feet for 15 minutes, 3-4 times a day. Limit salt in your diet. Prenatal Care  Schedule your prenatal visits by the twelfth week of pregnancy. They are usually scheduled monthly at first, then more often in the last 2 months before delivery.  Write down your questions. Take them to your prenatal visits.  Keep all your prenatal visits as directed by your health care provider. Safety  Wear your seat belt at all times when driving.  Make a list of emergency phone numbers, including numbers for family, friends, the hospital, and police and fire departments. General Tips    Ask your health care provider for a referral to a local prenatal education class. Begin classes no later than at the beginning of month 6 of your pregnancy.  Ask for help if you have counseling or nutritional needs during pregnancy. Your health care provider can offer advice or refer you to specialists for help with various needs.  Do not use hot tubs, steam rooms, or saunas.  Do not douche or use tampons or scented sanitary pads.  Do not cross your legs for long periods of time.  Avoid cat litter boxes and soil used by cats. These carry germs that can cause birth defects in the baby and possibly loss of the fetus by miscarriage or stillbirth.  Avoid all smoking, herbs, alcohol, and medicines not prescribed by your health care provider. Chemicals in these affect the formation and growth of the baby.  Schedule a dentist appointment. At home, brush your teeth with a soft toothbrush and be gentle when you floss. SEEK MEDICAL CARE IF:   You have  dizziness.  You have mild pelvic cramps, pelvic pressure, or nagging pain in the abdominal area.  You have persistent nausea, vomiting, or diarrhea.  You have a bad smelling vaginal discharge.  You have pain with urination.  You notice increased swelling in your face, hands, legs, or ankles. SEEK IMMEDIATE MEDICAL CARE IF:   You have a fever.  You are leaking fluid from your vagina.  You have spotting or bleeding from your vagina.  You have severe abdominal cramping or pain.  You have rapid weight gain or loss.  You vomit blood or material that looks like coffee grounds.  You are exposed to German measles and have never had them.  You are exposed to fifth disease or chickenpox.  You develop a severe headache.  You have shortness of breath.  You have any kind of trauma, such as from a fall or a car accident. Document Released: 11/28/2001 Document Revised: 04/20/2014 Document Reviewed: 10/14/2013 Exit[employer] Patient Information 2015 [employer]. This information is not intended to replace advice given to you by your health care provider. Make sure you discuss any questions you have with your health care provider.  

## 2015-08-24 NOTE — Progress Notes (Signed)
   Subjective:    Patient ID: Sandra Bauer, female    DOB: June 12, 1986, 29 y.o.   MRN: [id number]  HPI  Bilateral Otitis Media: Patient has completed her Augmentin and Ciprodex drops. She states the hearing has returned to normal on both of her ears, the pain has completely resolved. She denies any fever or chills. She is eating and drinking well.   Neck pain: Neck pain has completely resolved with the use of Flexeril when necessary and heating pad. Patient is no longer needing to use Flexeril. The "bumps" have completely resolved.  Amenorrhea: Patient's last menstrual period was 07/20/2015.  Patient states that she is supposed to go on a trip to the [place], leaving this Thursday. She states that she took a pregnancy test at home and it was positive. She has contacted her OB, who did not recommend she go on her trip secondary to possibility of contracting Zika virus in [place] and [place]. Patient was supposedly for a cruise on Thursday out of [place] to the [place], she is requesting a letter today to excuse her from this vacation, and hopefully help deter cost since it was unexpected.   Social History   Social History  . Marital Status: Married    Spouse Name: N/A  . Number of Children: N/A  . Years of Education: N/A   Occupational History  . Not on file.   Social History Main Topics  . Smoking status: Never Smoker   . Smokeless tobacco: Never Used  . Alcohol Use: No  . Drug Use: No  . Sexual Activity: Yes    Birth Control/ Protection: Condom   Other Topics Concern  . Not on file   Social History Narrative   Married 2015.   Orig from [place], lives in [place], [place].   Has three younger sisters in [place].   Attending nursing school, works full time at [employer] in [place].   No T/A/Ds.      Review of Systems Negative, with the exception of above mentioned in HPI     Objective:   Physical Exam BP 129/85 mmHg  Pulse 85  Temp(Src) 98.6 F (37 C) (Oral)  Resp 18  Ht   (1.549 m)  Wt 167 lb (75.751 kg)  BMI 31.57 kg/m2  SpO2 98%  LMP 07/20/2015  Breastfeeding? Unknown Gen: Afebrile. No acute distress. Nontoxic in appearance, well-developed, well-nourished. HENT: AT. [place]. Bilateral TM visualized and normal in appearance. MMM. Eyes:Pupils Equal Round Reactive to light, Extraocular movements intact Conjunctiva without redness, discharge or icterus. Neck: Supple, no lymphadenopathy     Assessment & Plan:  1. Amenorrhea - LMP 07/20/2015, positive pregnancy at home after late menses - POCT urine pregnancy --> positive - F/U with OB scheduled.   2. External otitis of left ear - resolved  3. External otitis of right ear - resolved  4. Neck pain on left side - resolved - D/C flexeril, speak with OB about safety of medication if needed.   5. Positive pregnancy test - Positive pregnancy test today in office.  - Encouraged her to take prenatal vitamins/folic acid daily.  - do not take any medication without confirming safety with OB - Letter written today to help with cancelling vacation plans, secondary to pregnancy.   -f/u prn

## 2015-08-24 NOTE — Progress Notes (Signed)
Pre visit review using our clinic review tool, if applicable. No additional management support is needed unless otherwise documented below in the visit note. 

## 2015-08-25 ENCOUNTER — Ambulatory Visit: Payer: Managed Care, Other (non HMO) | Admitting: Family Medicine

## 2015-09-02 ENCOUNTER — Ambulatory Visit (INDEPENDENT_AMBULATORY_CARE_PROVIDER_SITE_OTHER): Payer: Managed Care, Other (non HMO) | Admitting: Family Medicine

## 2015-09-02 ENCOUNTER — Encounter: Payer: Self-pay | Admitting: Family Medicine

## 2015-09-02 VITALS — BP 121/82 | HR 79 | Temp 98.9°F | Resp 16 | Ht 61.0 in | Wt 167.0 lb

## 2015-09-02 DIAGNOSIS — Z0000 Encounter for general adult medical examination without abnormal findings: Secondary | ICD-10-CM | POA: Diagnosis not present

## 2015-09-02 DIAGNOSIS — F4010 Social phobia, unspecified: Secondary | ICD-10-CM

## 2015-09-02 DIAGNOSIS — F411 Generalized anxiety disorder: Secondary | ICD-10-CM

## 2015-09-02 DIAGNOSIS — Z23 Encounter for immunization: Secondary | ICD-10-CM

## 2015-09-02 NOTE — Progress Notes (Signed)
Pre visit review using our clinic review tool, if applicable. No additional management support is needed unless otherwise documented below in the visit note. 

## 2015-09-02 NOTE — Addendum Note (Signed)
Addended by: Jeoffrey Massed on: 09/02/2015 11:24 AM   Modules accepted: Orders, Medications

## 2015-09-02 NOTE — Addendum Note (Signed)
Addended by: Westley Hummer on: 09/02/2015 11:45 AM   Modules accepted: Orders

## 2015-09-02 NOTE — Progress Notes (Signed)
Office Note 09/02/2015  CC:  Chief Complaint  Patient presents with  . Annual Exam  . Follow-up    HPI:  S[place] is a 29 y.o. Hispanic female who is here for CPE.Marland Kitchen She is approx [redacted] weeks pregnant, saw OB for first visit ([place]) this week and he was in favor of getting off of her cymbalta that she uses for GAD. Discussed use of nonmed options like exercise and breathing exercises, etc plus counseling as treatment in place of med. Pt agrees with this as do I. She says she got urine and blood work at the [facility] visit earlier this week.  Current feeling well, just some emotional fluctuations lately since being pregnant. Has been having a lot of nausea, some vomiting.  Her OB gave her a new class A anti-emetic for this but she has not taken it yet. Her first ultrasound was done and all looked good per pt report.    Past Medical History  Diagnosis Date  . Seasonal allergic rhinitis   . GAD (generalized anxiety disorder)     Past Surgical History  Procedure Laterality Date  . Wisdom tooth extraction  2 YEARS AGO    Family History  Problem Relation Age of Onset  . Cancer Maternal Grandfather     "stomach"  . Diabetes Paternal Grandfather   . Stroke Paternal Grandfather     Social History   Social History  . Marital Status: Married    Spouse Name: N/A  . Number of Children: N/A  . Years of Education: N/A   Occupational History  . Not on file.   Social History Main Topics  . Smoking status: Never Smoker   . Smokeless tobacco: Never Used  . Alcohol Use: No  . Drug Use: No  . Sexual Activity: Yes    Birth Control/ Protection: Condom   Other Topics Concern  . Not on file   Social History Narrative   Married 2015.   Orig from [place], lives in [place], [place].   Has three younger sisters in [place].   Attending nursing school, works full time at [employer] in [place].   No T/A/Ds.   MEDS: duloxetine 20 mg qd  Allergies  Allergen Reactions  . Nuvaring  [Etonogestrel-Ethinyl Estradiol]     Vaginal burning, HA, bloating, mood change, nose bleed   ROS Review of Systems  Constitutional: Negative for fever, chills, appetite change and fatigue.  HENT: Negative for congestion, dental problem, ear pain and sore throat.   Eyes: Negative for discharge, redness and visual disturbance.  Respiratory: Negative for cough, chest tightness, shortness of breath and wheezing.   Cardiovascular: Negative for chest pain, palpitations and leg swelling.  Gastrointestinal: Positive for nausea and vomiting. Negative for abdominal pain, diarrhea and blood in stool.  Genitourinary: Negative for dysuria, urgency, frequency, hematuria, flank pain and difficulty urinating.  Musculoskeletal: Negative for myalgias, back pain, joint swelling, arthralgias and neck stiffness.  Skin: Negative for pallor and rash.  Neurological: Negative for dizziness, speech difficulty, weakness and headaches.  Hematological: Negative for adenopathy. Does not bruise/bleed easily.  Psychiatric/Behavioral: Negative for confusion, sleep disturbance and dysphoric mood. The patient is nervous/anxious.     PE; Blood pressure 121/82, pulse 79, temperature 98.9 F (37.2 C), temperature source Oral, resp. rate 16, height 5\' 1"  (1.549 m), weight 167 lb (75.751 kg), last menstrual period 07/20/2015, SpO2 99 %, currently breastfeeding. Gen: Alert, well appearing.  Patient is oriented to person, place, time, and situation. AFFECT: pleasant, lucid  thought and speech. ENT: Ears: EACs clear, normal epithelium.  TMs with good light reflex and landmarks bilaterally.  Eyes: no injection, icteris, swelling, or exudate.  EOMI, PERRLA. Nose: no drainage.  She has bilat turbinate edema and mild injection.  No focal lesion.  Mouth: lips without lesion/swelling.  Oral mucosa pink and moist.  Dentition intact and without obvious caries or gingival swelling.  Oropharynx without erythema, exudate, or swelling.  Neck:  supple/nontender.  No LAD, mass, or TM.   CV: RRR, no m/r/g.   LUNGS: CTA bilat, nonlabored resps, good aeration in all lung fields. ABD: soft, NT, ND, BS normal.  No hepatospenomegaly or mass.  No bruits. EXT: no clubbing, cyanosis, or edema.  Musculoskeletal: no joint swelling, erythema, warmth, or tenderness.  ROM of all joints intact. Skin - no sores or suspicious lesions or rashes or color changes  Pertinent labs:  None today  ASSESSMENT AND PLAN:   1) GAD with social anxiety: ween off cymbalta now that she is pregnant: take  tab qod x 5 doses then stop the med. Discussed exercise, breathing exercises, hobbies, prayer/meditation, and seeing a counselor as nonmedication ways to deal with her anxiety. She is open to trying all of these: gave contact info for Ollen Gross at [facility] in [place] and she'll contact her for counseling.  2) Pregnancy: doing well so far, has gotten appropriate OB care/labs.  3) Health maintenance exam: Reviewed age and gender appropriate health maintenance issues (prudent diet, regular exercise, health risks of tobacco and excessive alcohol, use of seatbelts, fire alarms in home, use of sunscreen).  Also reviewed age and gender appropriate health screening as well as vaccine recommendations. Flu vaccine given today. No labs, as she has had these done earlier this week at her OB/GYN's office in [place], [place]. I filled out a health form required for her to go into [place] work in [place] in the near future.  An After Visit Summary was printed and given to the patient.  FOLLOW UP:  Return in about 2 months (around 11/02/2015) for f/u anxiety (30 min).

## 2015-09-02 NOTE — Patient Instructions (Signed)
Take cymbalta  every other day for 5 doses, then stop this med  Call Ollen Gross, PhD--she is a psychologist who can help with your anxiety. [phone]  Address: [address].

## 2015-10-26 ENCOUNTER — Encounter (HOSPITAL_COMMUNITY): Payer: Self-pay | Admitting: Obstetrics

## 2015-10-26 NOTE — Anesthesia Preprocedure Evaluation (Addendum)
Anesthesia Evaluation  Patient identified by MRN, date of birth, ID band Patient awake    Reviewed: Allergy & Precautions, NPO status , Patient's Chart, lab work & pertinent test results  Airway Mallampati: II   Neck ROM: Full    Dental  (+) Teeth Intact, Dental Advisory Given   Pulmonary neg pulmonary ROS,    breath sounds clear to auscultation       Cardiovascular negative cardio ROS   Rhythm:Regular     Neuro/Psych Anxiety negative neurological ROS     GI/Hepatic negative GI ROS, Neg liver ROS,   Endo/Other  negative endocrine ROS  Renal/GU negative Renal ROS     Musculoskeletal   Abdominal (+)  Abdomen: soft.    Peds  Hematology   Anesthesia Other Findings   Reproductive/Obstetrics 13 week Missed AB, LMP 07/20/15                            Anesthesia Physical Anesthesia Plan  ASA: II  Anesthesia Plan: General   Post-op Pain Management:    Induction: Intravenous  Airway Management Planned: Oral ETT  Additional Equipment:   Intra-op Plan:   Post-operative Plan: Extubation in OR  Informed Consent: I have reviewed the patients History and Physical, chart, labs and discussed the procedure including the risks, benefits and alternatives for the proposed anesthesia with the patient or authorized representative who has indicated his/her understanding and acceptance.     Plan Discussed with:   Anesthesia Plan Comments: (13 week missed AB, check am labs)        Anesthesia Quick Evaluation

## 2015-10-26 NOTE — H&P (Signed)
29 y.o. G1P0 [facility].  She had a 6 week US that documented FCA by another provider.  Her first US at our office was 4 weeks later and showed a fetal pole with no cardiac activity and large hemorrhage adjacent to the irregular sac.  She underwent medical management with cytotec and expelled a portion of the pregnancy, but follow up ultrasound showed retained products in the right horn of the uterus.  She took two additional doses of cytotec, but this was not successful in completing the miscarriage. She presents today for surgical management.   Past Medical History  Diagnosis Date  . Seasonal allergic rhinitis   . GAD (generalized anxiety disorder)     Past Surgical History  Procedure Laterality Date  . Wisdom tooth extraction  2 YEARS AGO    OB History  Gravida Para Term Preterm AB SAB TAB Ectopic Multiple Living  1             # Outcome Date GA Lbr Len/2nd Weight Sex Delivery Anes PTL Lv  1 Current               Social History   Social History  . Marital Status: Married    Spouse Name: N/A  . Number of Children: N/A  . Years of Education: N/A   Occupational History  . Not on file.   Social History Main Topics  . Smoking status: Never Smoker   . Smokeless tobacco: Never Used  . Alcohol Use: No  . Drug Use: No  . Sexual Activity: Yes    Birth Control/ Protection: Condom   Other Topics Concern  . Not on file   Social History Narrative   Married 2015.   Orig from [place], lives in [place], [place].   Has three younger sisters in [place].   Attending nursing school, works full time at [employer] in [place].   No T/A/Ds.     Other PNC: uncomplicated.   Filed Vitals:   10/27/15 1049  BP: 120/84  Temp: 99.2 F (37.3 C)  Resp: 16     General:  NAD  FHTs:  Absent    A/P   29 y.o.  G1P0 presents for dilation and curettage for incomplete miscarriage.   Has failed medical management x 2.  Now desires to proceed with surgical management.   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  Discussed risks to include infection, bleeding, damage to surrounding structures (including but not limited to vagina, cervix, bladder, uterus), uterine perforation, need for additional procedures.  All questions answered and patient elects to proceed.  Rh: positive  Cathryne Mancebo GEFFEL Lavonda Thal

## 2015-10-27 ENCOUNTER — Encounter (HOSPITAL_COMMUNITY)
Admission: RE | Disposition: A | Discharge status: Home / Self Care | Payer: Self-pay | Source: Ambulatory Visit | Attending: Obstetrics

## 2015-10-27 ENCOUNTER — Encounter (HOSPITAL_COMMUNITY): Payer: Self-pay

## 2015-10-27 ENCOUNTER — Ambulatory Visit (HOSPITAL_COMMUNITY): Payer: Managed Care, Other (non HMO) | Admitting: Anesthesiology

## 2015-10-27 ENCOUNTER — Ambulatory Visit (HOSPITAL_COMMUNITY)
Admission: RE | Admit: 2015-10-27 | Discharge: 2015-10-27 | Disposition: A | Discharge status: Home / Self Care | Payer: Managed Care, Other (non HMO) | Source: Ambulatory Visit | Attending: Obstetrics | Admitting: Obstetrics

## 2015-10-27 ENCOUNTER — Ambulatory Visit (HOSPITAL_COMMUNITY): Payer: Managed Care, Other (non HMO)

## 2015-10-27 DIAGNOSIS — O021 Missed abortion: Secondary | ICD-10-CM

## 2015-10-27 DIAGNOSIS — O034 Incomplete spontaneous abortion without complication: Secondary | ICD-10-CM

## 2015-10-27 DIAGNOSIS — Q513 Bicornate uterus: Secondary | ICD-10-CM | POA: Diagnosis not present

## 2015-10-27 HISTORY — DX: Complete or unspecified spontaneous abortion without complication: O03.9

## 2015-10-27 HISTORY — PX: DILATION AND EVACUATION: SHX1459

## 2015-10-27 LAB — CBC
HEMATOCRIT: 41.8 % (ref 36.0–46.0)
HEMOGLOBIN: 13.9 g/dL (ref 12.0–15.0)
MCH: 29.3 pg (ref 26.0–34.0)
MCHC: 33.3 g/dL (ref 30.0–36.0)
MCV: 88.2 fL (ref 78.0–100.0)
Platelets: 296 10*3/uL (ref 150–400)
RBC: 4.74 MIL/uL (ref 3.87–5.11)
RDW: 12.9 % (ref 11.5–15.5)
WBC: 7.2 10*3/uL (ref 4.0–10.5)

## 2015-10-27 SURGERY — DILATION AND EVACUATION, UTERUS
Anesthesia: General | Site: Vagina

## 2015-10-27 MED ORDER — FENTANYL CITRATE (PF) 100 MCG/2ML IJ SOLN
INTRAMUSCULAR | Status: AC
  Filled 2015-10-27: qty 2

## 2015-10-27 MED ORDER — PROPOFOL 10 MG/ML IV BOLUS
INTRAVENOUS | Status: DC | PRN
  Administered 2015-10-27: 200 mg via INTRAVENOUS

## 2015-10-27 MED ORDER — DEXAMETHASONE SODIUM PHOSPHATE 10 MG/ML IJ SOLN
INTRAMUSCULAR | Status: AC
  Filled 2015-10-27: qty 1

## 2015-10-27 MED ORDER — SUCCINYLCHOLINE CHLORIDE 20 MG/ML IJ SOLN
INTRAMUSCULAR | Status: AC
  Filled 2015-10-27: qty 1

## 2015-10-27 MED ORDER — ONDANSETRON HCL 4 MG/2ML IJ SOLN
INTRAMUSCULAR | Status: AC
  Filled 2015-10-27: qty 2

## 2015-10-27 MED ORDER — KETOROLAC TROMETHAMINE 30 MG/ML IJ SOLN
INTRAMUSCULAR | Status: DC | PRN
  Administered 2015-10-27: 30 mg via INTRAVENOUS

## 2015-10-27 MED ORDER — DIPHENHYDRAMINE HCL 25 MG PO CAPS
25.0000 mg | ORAL_CAPSULE | Freq: Once | ORAL | Status: AC
  Administered 2015-10-27: 25 mg via ORAL
  Filled 2015-10-27: qty 1

## 2015-10-27 MED ORDER — PROMETHAZINE HCL 25 MG/ML IJ SOLN: 6.2500 mg | INTRAMUSCULAR | Status: DC | PRN

## 2015-10-27 MED ORDER — ONDANSETRON HCL 4 MG/2ML IJ SOLN
INTRAMUSCULAR | Status: DC | PRN
Start: 2015-10-27 — End: 2015-10-27
  Administered 2015-10-27: 4 mg via INTRAVENOUS

## 2015-10-27 MED ORDER — FENTANYL CITRATE (PF) 250 MCG/5ML IJ SOLN
INTRAMUSCULAR | Status: DC | PRN
  Administered 2015-10-27 (×2): 100 ug via INTRAVENOUS

## 2015-10-27 MED ORDER — SCOPOLAMINE 1 MG/3DAYS TD PT72
1.0000 | MEDICATED_PATCH | Freq: Once | TRANSDERMAL | Status: DC
  Administered 2015-10-27: 1.5 mg via TRANSDERMAL

## 2015-10-27 MED ORDER — MEPERIDINE HCL 25 MG/ML IJ SOLN: 6.2500 mg | INTRAMUSCULAR | Status: DC | PRN

## 2015-10-27 MED ORDER — PROPOFOL 10 MG/ML IV BOLUS
INTRAVENOUS | Status: AC
  Filled 2015-10-27: qty 20

## 2015-10-27 MED ORDER — FENTANYL CITRATE (PF) 250 MCG/5ML IJ SOLN
INTRAMUSCULAR | Status: AC
  Filled 2015-10-27: qty 25

## 2015-10-27 MED ORDER — SCOPOLAMINE 1 MG/3DAYS TD PT72
MEDICATED_PATCH | TRANSDERMAL | Status: AC
  Administered 2015-10-27: 1.5 mg via TRANSDERMAL
  Filled 2015-10-27: qty 1

## 2015-10-27 MED ORDER — LIDOCAINE HCL 1 % IJ SOLN
INTRAMUSCULAR | Status: DC | PRN
  Administered 2015-10-27: 10 mL

## 2015-10-27 MED ORDER — LIDOCAINE HCL (CARDIAC) 20 MG/ML IV SOLN
INTRAVENOUS | Status: DC | PRN
  Administered 2015-10-27: 100 mg via INTRAVENOUS

## 2015-10-27 MED ORDER — LIDOCAINE HCL 1 % IJ SOLN
INTRAMUSCULAR | Status: AC
  Filled 2015-10-27: qty 20

## 2015-10-27 MED ORDER — SUCCINYLCHOLINE CHLORIDE 20 MG/ML IJ SOLN
INTRAMUSCULAR | Status: DC | PRN
  Administered 2015-10-27: 100 mg via INTRAVENOUS

## 2015-10-27 MED ORDER — DEXAMETHASONE SODIUM PHOSPHATE 4 MG/ML IJ SOLN
INTRAMUSCULAR | Status: DC | PRN
  Administered 2015-10-27: 5 mg via INTRAVENOUS

## 2015-10-27 MED ORDER — FENTANYL CITRATE (PF) 100 MCG/2ML IJ SOLN
25.0000 ug | INTRAMUSCULAR | Status: DC | PRN
  Administered 2015-10-27 (×2): 50 ug via INTRAVENOUS

## 2015-10-27 MED ORDER — DOXYCYCLINE HYCLATE 50 MG PO CAPS: 50.0000 mg | ORAL_CAPSULE | Freq: Two times a day (BID) | ORAL | Status: DC

## 2015-10-27 MED ORDER — GLYCOPYRROLATE 0.2 MG/ML IJ SOLN
INTRAMUSCULAR | Status: DC | PRN
  Administered 2015-10-27: .05 mg via INTRAVENOUS

## 2015-10-27 MED ORDER — MIDAZOLAM HCL 2 MG/2ML IJ SOLN
INTRAMUSCULAR | Status: DC | PRN
  Administered 2015-10-27: 2 mg via INTRAVENOUS

## 2015-10-27 MED ORDER — LACTATED RINGERS IV SOLN
INTRAVENOUS | Status: DC
  Administered 2015-10-27: 10 mL/h via INTRAVENOUS
  Administered 2015-10-27: 13:00:00 via INTRAVENOUS

## 2015-10-27 MED ORDER — LIDOCAINE HCL (CARDIAC) 20 MG/ML IV SOLN
INTRAVENOUS | Status: AC
  Filled 2015-10-27: qty 5

## 2015-10-27 MED ORDER — GLYCOPYRROLATE 0.2 MG/ML IJ SOLN
INTRAMUSCULAR | Status: AC
  Filled 2015-10-27: qty 1

## 2015-10-27 MED ORDER — IBUPROFEN 600 MG PO TABS: 600.0000 mg | ORAL_TABLET | Freq: Four times a day (QID) | ORAL | Status: DC | PRN

## 2015-10-27 MED ORDER — HYDROCODONE-ACETAMINOPHEN 5-325 MG PO TABS: 1.0000 | ORAL_TABLET | Freq: Four times a day (QID) | ORAL | Status: DC | PRN

## 2015-10-27 MED ORDER — MIDAZOLAM HCL 2 MG/2ML IJ SOLN
INTRAMUSCULAR | Status: AC
  Filled 2015-10-27: qty 4

## 2015-10-27 SURGICAL SUPPLY — 20 items
CATH ROBINSON RED A/P 16FR (CATHETERS) ×2 IMPLANT
CLOTH BEACON ORANGE TIMEOUT ST (SAFETY) ×2 IMPLANT
CONT PATH 16OZ SNAP LID 3702 (MISCELLANEOUS) ×2 IMPLANT
DECANTER SPIKE VIAL GLASS SM (MISCELLANEOUS) IMPLANT
DILATOR CANAL MILEX (MISCELLANEOUS) IMPLANT
GLOVE BIO SURGEON STRL SZ 6 (GLOVE) ×2 IMPLANT
GLOVE BIOGEL PI IND STRL 6.5 (GLOVE) ×2 IMPLANT
GLOVE BIOGEL PI INDICATOR 6.5 (GLOVE) ×2
GOWN STRL REUS W/TWL LRG LVL3 (GOWN DISPOSABLE) ×4 IMPLANT
KIT BERKELEY 1ST TRIMESTER 3/8 (MISCELLANEOUS) ×2 IMPLANT
NS IRRIG 1000ML POUR BTL (IV SOLUTION) ×2 IMPLANT
PACK VAGINAL MINOR WOMEN LF (CUSTOM PROCEDURE TRAY) ×2 IMPLANT
PAD OB MATERNITY 4.3X12.25 (PERSONAL CARE ITEMS) ×2 IMPLANT
PAD PREP 24X48 CUFFED NSTRL (MISCELLANEOUS) ×2 IMPLANT
SET BERKELEY SUCTION TUBING (SUCTIONS) ×2 IMPLANT
TOWEL OR 17X24 6PK STRL BLUE (TOWEL DISPOSABLE) ×4 IMPLANT
VACURETTE 10 RIGID CVD (CANNULA) IMPLANT
VACURETTE 7MM CVD STRL WRAP (CANNULA) ×2 IMPLANT
VACURETTE 8 RIGID CVD (CANNULA) IMPLANT
VACURETTE 9 RIGID CVD (CANNULA) IMPLANT

## 2015-10-27 NOTE — Op Note (Signed)
Pre-Operative Diagnosis: 1. Incomplete miscarriage 2. Bicornuate uterus Postoperative Diagnosis: Same as above Procedure: Dilation and curettage with ultrasound guidance Surgeon: Marlow Baarsyanna Chanler Mendonca, MD  Operative Findings: Retained products of conception in right horn of bicornuate uterus.  Using ultrasound guidance, the right horn was surgically evacuated Specimen products of conception EBL: minimal    After adequate anesthesia was achieved, the patient placed in the dorsal lithotomy position in [place] stirrups.  She was prepped and draped in the usual sterile fashion.  The speculum was placed in the vagina and the anterior lip of the cervix grasped with a single-tooth tenaculum.   10cc of 1% lidocaine was administered in a paracervical fashion. The cervix was serially dilated with Hank dilators. .Under ultrasound guidance, a 7 mm suction curette was advanced to the fundus in the right uterine horn, the vacuum was engaged, and multiple suction passes were performed until the products of conception were evacuated. A Sharp curettage was performed and a gritty texture was noted. A final suction pass was performed with minimal results. Ultrasound confirmed thin endometrial stripe with complete evacuation of products of conception.  This completed the procedure. A bimanual massage was performed and confirmed good uterine tone.   All instruments were removed from the vagina.  Pressure was used to achieve hemostasis at the tenaculum site. The patient tolerated the procedure well was brought to the recovery room in stable condition for the procedure. All sponge and needle counts correct x2.   [name], [facility]

## 2015-10-27 NOTE — Anesthesia Postprocedure Evaluation (Signed)
  Anesthesia Post-op Note  Patient: [place]  Procedure(s) Performed: Procedure(s) with comments: DILATATION AND EVACUATION ULTRASOUND (N/A) - Ultrasound called on 10/26/15 by Donne Hazelhassity Stewart RN  Patient Location: PACU  Anesthesia Type:General  Level of Consciousness: awake, alert  and oriented  Airway and Oxygen Therapy: Patient Spontanous Breathing  Post-op Pain: none  Post-op Assessment: Post-op Vital signs reviewed, Patient's Cardiovascular Status Stable, Respiratory Function Stable, Patent Airway and No signs of Nausea or vomiting              Post-op Vital Signs: Reviewed and stable  Last Vitals:  Filed Vitals:   10/27/15 1049  BP: 120/84  Temp: 37.3 C  Resp: 16    Complications: No apparent anesthesia complications

## 2015-10-27 NOTE — Anesthesia Procedure Notes (Signed)
Procedure Name: Intubation Date/Time: 10/27/2015 12:16 PM Performed by: Graciela HusbandsFUSSELL, Yanelle Sousa O Pre-anesthesia Checklist: Patient identified, Timeout performed, Emergency Drugs available, Suction available and Patient being monitored Patient Re-evaluated:Patient Re-evaluated prior to inductionOxygen Delivery Method: Circle system utilized Preoxygenation: Pre-oxygenation with 100% oxygen Intubation Type: IV induction, Cricoid Pressure applied and Rapid sequence Laryngoscope Size: Mac and 3 Grade View: Grade I Tube type: Oral Tube size: 7.0 mm Number of attempts: 1 Airway Equipment and Method: Stylet Placement Confirmation: ETT inserted through vocal cords under direct vision,  positive ETCO2 and breath sounds checked- equal and bilateral Secured at: 21 cm Tube secured with: Tape Dental Injury: Teeth and Oropharynx as per pre-operative assessment

## 2015-10-27 NOTE — Transfer of Care (Signed)
Immediate Anesthesia Transfer of Care Note  Patient: [place]  Procedure(s) Performed: Procedure(s) with comments: DILATATION AND EVACUATION ULTRASOUND (N/A) - Ultrasound called on 10/26/15 by Donne Hazelhassity Stewart RN  Patient Location: PACU  Anesthesia Type:General  Level of Consciousness: awake, alert  and oriented  Airway & Oxygen Therapy: Patient Spontanous Breathing and Patient connected to nasal cannula oxygen  Post-op Assessment: Report given to RN and Post -op Vital signs reviewed and stable  Post vital signs: Reviewed and stable  Last Vitals:  Filed Vitals:   10/27/15 1049  BP: 120/84  Temp: 37.3 C  Resp: 16    Complications: No apparent anesthesia complications

## 2015-10-27 NOTE — Brief Op Note (Signed)
10/27/2015  12:35 PM  PATIENT:  S[place]  29 y.o. female  PRE-OPERATIVE DIAGNOSIS:  MISSED ABORTION  POST-OPERATIVE DIAGNOSIS:  MISSED ABORTION  PROCEDURE:  Procedure(s) with comments: DILATATION AND EVACUATION ULTRASOUND (N/A) - Ultrasound called on 10/26/15 by Donne Hazelhassity Stewart RN  SURGEON:  Surgeon(s) and Role:    * Marlow Baarsyanna Teandre Hamre, MD - Primary  ANESTHESIA:   general  EBL:  Total I/O In: -  Out: 100 [Urine:100]  BLOOD ADMINISTERED:none  DRAINS: none   LOCAL MEDICATIONS USED:  LIDOCAINE  and Amount: 10 ml  SPECIMEN:  Source of Specimen:  products of conception  DISPOSITION OF SPECIMEN:  PATHOLOGY  COUNTS:  YES  TOURNIQUET:  * No tourniquets in log *  DICTATION: .Note written in EPIC  PLAN OF CARE: Discharge to home after PACU  PATIENT DISPOSITION:  PACU - hemodynamically stable.   Delay start of Pharmacological VTE agent (>24hrs) due to surgical blood loss or risk of bleeding: not applicable

## 2015-10-27 NOTE — Discharge Instructions (Signed)
Pelvic rest x 2 weeks (no intercourse or tampons).  No tub baths or swimming for two weeks.    Do not drive until you are not taking narcotic pain medication.   Call your doctor if you have heavy vaginal bleeding (soaking through a pad an hour or more for >2 hours in a row), temperature >101F, severe nausea, vomiting, severe or worsening abdominal pain, dizziness, shortness of breath, chest pain or any other concerns.  Please take motrin every 6 hours.  Add percocet if your pain is not controlled by motrin alone. You have been prescribed an antibiotic.  Please take one tab twice daily for 3 days.   Post Anesthesia Home Care Instructions  Activity: Get plenty of rest for the remainder of the day. A responsible adult should stay with you for 24 hours following the procedure.  For the next 24 hours, DO NOT: -Drive a car -Advertising copywriterperate machinery -Drink alcoholic beverages -Take any medication unless instructed by your physician -Make any legal decisions or sign important papers.  Meals: Start with liquid foods such as gelatin or soup. Progress to regular foods as tolerated. Avoid greasy, spicy, heavy foods. If nausea and/or vomiting occur, drink only clear liquids until the nausea and/or vomiting subsides. Call your physician if vomiting continues.  Special Instructions/Symptoms: Your throat may feel dry or sore from the anesthesia or the breathing tube placed in your throat during surgery. If this causes discomfort, gargle with warm salt water. The discomfort should disappear within 24 hours.  If you had a scopolamine patch placed behind your ear for the management of post- operative nausea and/or vomiting:  1. The medication in the patch is effective for 72 hours, after which it should be removed.  Wrap patch in a tissue and discard in the trash. Wash hands thoroughly with soap and water. 2. You may remove the patch earlier than 72 hours if you experience unpleasant side effects which may include  dry mouth, dizziness or visual disturbances. 3. Avoid touching the patch. Wash your hands with soap and water after contact with the patch.

## 2015-10-27 NOTE — OR Nursing (Signed)
Patient states blood type O positive

## 2015-10-28 ENCOUNTER — Encounter (HOSPITAL_COMMUNITY): Payer: Self-pay | Admitting: Family Medicine

## 2015-11-03 ENCOUNTER — Encounter: Payer: Self-pay | Admitting: Family Medicine

## 2015-11-03 ENCOUNTER — Ambulatory Visit (INDEPENDENT_AMBULATORY_CARE_PROVIDER_SITE_OTHER): Payer: Managed Care, Other (non HMO) | Admitting: Family Medicine

## 2015-11-03 VITALS — BP 120/86 | HR 83 | Temp 98.8°F | Resp 16 | Ht 61.0 in | Wt 166.0 lb

## 2015-11-03 DIAGNOSIS — F411 Generalized anxiety disorder: Secondary | ICD-10-CM

## 2015-11-03 DIAGNOSIS — H8110 Benign paroxysmal vertigo, unspecified ear: Secondary | ICD-10-CM | POA: Diagnosis not present

## 2015-11-03 NOTE — Progress Notes (Signed)
OFFICE VISIT  11/03/2015   CC:  Chief Complaint  Patient presents with  . Follow-up    Anxiety, pt is fasting.    HPI:    Patient is a 29 y.o. Hispanic female who presents for f/u of chronic anxiety disorder. Last visit we got her off her cymbalta b/c she was pregnant. She ended up having a miscarriage and got d&e for missed ab 10/27/15.  She is coping ok, as expected, is grieving with family, friends, church support.  Has not taken any anti-anxiety meds. Limiting caffeine has really helped her anxiety, and she has established a therapeutic counseling relationship with Ollen Gross since I last saw her.    Exercise: takes her new puppie dog for walks in mornings and this has been therapeutic.  Has had intermittent feeling of vertigo with head movements last few days.  Not persistent with any particular head movement-"sometimes it happens and sometimes it doesn't".  In between episodes she feels fine. Some nausea associated yesterday, w/out vomiting.  Not sure about orthostatic dizziness, as she seems to deny having this but then later acknowledges this happens some. The episodes last a few seconds.  She was has not been on any new meds the last few days.  ROS: no vag bleeding.  No HA, no vision complaints or hearing complaints.    Past Medical History  Diagnosis Date  . Seasonal allergic rhinitis   . GAD (generalized anxiety disorder)   . SAB (spontaneous abortion)     Past Surgical History  Procedure Laterality Date  . Wisdom tooth extraction  2 YEARS AGO  . Dilation and evacuation  10/26/15    for missed abortion  . Dilation and evacuation N/A 10/27/2015    Procedure: DILATATION AND EVACUATION ULTRASOUND;  Surgeon: Marlow Baars, MD;  Location: [facility];  Service: Gynecology;  Laterality: N/A;  Ultrasound called on 10/26/15 by Donne Hazel RN   MEDS: none currently  Allergies  Allergen Reactions  . Nuvaring [Etonogestrel-Ethinyl Estradiol]     Vaginal burning, HA,  bloating, mood change, nose bleed    ROS As per HPI  PE: Blood pressure 120/86, pulse 83, temperature 98.8 F (37.1 C), temperature source Oral, resp. rate 16, height  (1.549 m), weight 166 lb (75.297 kg), last menstrual period 07/20/2015, SpO2 99 %, unknown if currently breastfeeding. Wt Readings from Last 2 Encounters:  11/03/15 166 lb (75.297 kg)  10/27/15 164 lb (74.39 kg)    Gen: alert, oriented x 4, affect pleasant.  Lucid thinking and conversation noted. HEENT: PERRLA, EOMI.   Neck: no LAD, mass, or thyromegaly. CV: RRR, no m/r/g LUNGS: CTA bilat, nonlabored. NEURO: no tremor or tics noted on observation.  Coordination intact. CN 2-12 grossly intact bilaterally, strength 5/5 in all extremeties.  No ataxia. Dix Halpike maneuver did not elicit any sx's except some brief nausea when pt sat up from supine position with head having been turned to the right.  No nystagmus.  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 Discussed dx, gave handout for home epley's maneuvers after explaining them to pt today.  2) GAD, hx of depression as well.  Doing pretty well, esp in the face of the acute emotional turmoil of having a miscarriage lately. She'll continue with counseling with Ollen Gross.  She wants to resume efforts to try getting pregnant again whenever her menstrual cycle returns, so we'll avoid any meds at this time. Minimize caffeine.  An After Visit Summary was printed and given to the patient.  FOLLOW UP: Return if symptoms worsen or fail to improve.

## 2015-11-03 NOTE — Progress Notes (Signed)
Pre visit review using our clinic review tool, if applicable. No additional management support is needed unless otherwise documented below in the visit note. 

## 2015-11-29 ENCOUNTER — Other Ambulatory Visit: Payer: Self-pay | Admitting: Obstetrics

## 2015-11-29 DIAGNOSIS — Q513 Bicornate uterus: Secondary | ICD-10-CM

## 2015-12-08 ENCOUNTER — Ambulatory Visit
Admission: RE | Admit: 2015-12-08 | Discharge: 2015-12-08 | Disposition: A | Discharge status: Home / Self Care | Payer: Managed Care, Other (non HMO) | Source: Ambulatory Visit | Attending: Obstetrics | Admitting: Obstetrics

## 2015-12-08 DIAGNOSIS — Q513 Bicornate uterus: Secondary | ICD-10-CM

## 2015-12-08 HISTORY — DX: Bicornate uterus: Q51.3

## 2015-12-19 DIAGNOSIS — Z8632 Personal history of gestational diabetes: Secondary | ICD-10-CM

## 2015-12-19 HISTORY — DX: Personal history of gestational diabetes: Z86.32

## 2016-01-26 ENCOUNTER — Other Ambulatory Visit: Payer: Self-pay | Admitting: Obstetrics

## 2016-02-24 LAB — OB RESULTS CONSOLE HIV ANTIBODY (ROUTINE TESTING): HIV: NONREACTIVE

## 2016-02-24 LAB — OB RESULTS CONSOLE GC/CHLAMYDIA
CHLAMYDIA, DNA PROBE: NEGATIVE
GC PROBE AMP, GENITAL: NEGATIVE

## 2016-02-24 LAB — OB RESULTS CONSOLE ABO/RH: RH Type: POSITIVE

## 2016-02-24 LAB — OB RESULTS CONSOLE RPR: RPR: NONREACTIVE

## 2016-02-24 LAB — OB RESULTS CONSOLE HEPATITIS B SURFACE ANTIGEN: Hepatitis B Surface Ag: NEGATIVE

## 2016-02-24 LAB — OB RESULTS CONSOLE ANTIBODY SCREEN: ANTIBODY SCREEN: NEGATIVE

## 2016-02-24 LAB — OB RESULTS CONSOLE RUBELLA ANTIBODY, IGM: Rubella: IMMUNE

## 2016-02-26 ENCOUNTER — Encounter: Payer: Self-pay | Admitting: Family Medicine

## 2016-05-09 ENCOUNTER — Telehealth: Payer: Self-pay | Admitting: Family Medicine

## 2016-05-09 NOTE — Telephone Encounter (Signed)
Form put on Dr. Samul DadaMcGowen's desk. Please advise. Thanks.

## 2016-05-09 NOTE — Telephone Encounter (Signed)
[place]. She is okay with [facility] being [facility](form on [facility] desk) & let them contact her or patient okay with Dr. Milinda CaveMcGowen recommending someone.

## 2016-05-10 NOTE — Telephone Encounter (Signed)
I filled her BH form out. I'm sorry but I don't know any of the mental health care providers in [place] so I can't recommend one.

## 2016-05-10 NOTE — Telephone Encounter (Signed)
Left detailed message on cell vm, okay per DPR.  

## 2016-06-05 ENCOUNTER — Ambulatory Visit (INDEPENDENT_AMBULATORY_CARE_PROVIDER_SITE_OTHER): Payer: Managed Care, Other (non HMO) | Admitting: Family Medicine

## 2016-06-05 ENCOUNTER — Encounter: Payer: Self-pay | Admitting: Family Medicine

## 2016-06-05 VITALS — BP 115/78 | HR 85 | Temp 98.8°F | Resp 16 | Ht 61.0 in | Wt 173.5 lb

## 2016-06-05 DIAGNOSIS — F413 Other mixed anxiety disorders: Secondary | ICD-10-CM | POA: Diagnosis not present

## 2016-06-05 DIAGNOSIS — F329 Major depressive disorder, single episode, unspecified: Secondary | ICD-10-CM

## 2016-06-05 DIAGNOSIS — F419 Anxiety disorder, unspecified: Principal | ICD-10-CM

## 2016-06-05 DIAGNOSIS — F418 Other specified anxiety disorders: Secondary | ICD-10-CM

## 2016-06-05 DIAGNOSIS — F32A Depression, unspecified: Secondary | ICD-10-CM

## 2016-06-05 NOTE — Progress Notes (Signed)
OFFICE VISIT  06/05/2016   CC:  Chief Complaint  Patient presents with  . FMLA paperwork    will fax over form to be completed  . Follow-up    Anxiety     HPI:    Patient is a 30 y.o.  female who presents for f/u anxiety.   She is [redacted] wks pregnant, has been feeling her anxiety and depressed feelings coming on more and more as the pregnancy has gone on.  Has appt with a psychiatrist 07/16/16. GYN rx'd zoloft to her at last o/v there but pt is scared to take this med and hasn't started it yet.   In the past she has responded favorably to duloxetine (when not pregnant).  Citalopram made her feel too tired/drowsy.    Describes excessive worrry/anxiety, sometimes affects her the entire day, sometimes doesn't even want to eat. No SI or HI.  No panic attacks.  Has crying spells in the mornings for about an hour.  Says sometimes she just wants to sleep.    Past Medical History  Diagnosis Date  . Seasonal allergic rhinitis   . GAD (generalized anxiety disorder)   . SAB (spontaneous abortion) 10/2015  . Bicornate uterus     Renal u/s 11/2015 to eval for renal abnormalities: NORMAL    Past Surgical History  Procedure Laterality Date  . Wisdom tooth extraction  2 YEARS AGO  . Dilation and evacuation  10/26/15    for missed abortion  . Dilation and evacuation N/A 10/27/2015    Procedure: DILATATION AND EVACUATION ULTRASOUND;  Surgeon: Marlow Baars, MD;  Location: [facility];  Service: Gynecology;  Laterality: N/A;  Ultrasound called on 10/26/15 by Donne Hazel RN    No outpatient prescriptions prior to visit.   No facility-administered medications prior to visit.    Allergies  Allergen Reactions  . Nuvaring [Etonogestrel-Ethinyl Estradiol]     Vaginal burning, HA, bloating, mood change, nose bleed    ROS As per HPI  PE: Blood pressure 115/78, pulse 85, temperature 98.8 F (37.1 C), temperature source Oral, resp. rate 16, height  (1.549 m), weight 173 lb 8 oz (78.699 kg),  SpO2 98 %, unknown if currently breastfeeding. Gen: Alert, well appearing.  Patient is oriented to person, place, time, and situation. AFFECT: pleasant, lucid thought and speech. No further exam today.  LABS:  none  IMPRESSION AND PLAN:  GAD with depression: she is struggling with the decision of whether or not to take medication treatment for this or not.  She asked my recommendation b/c I have a history with her as my patient and I know her.  I told her I think she would get more anxious and beat herself up more if she took the antidepressant while pregnant.  However, I warned her that if she gets worse and feels like her mental or physical health is potentially harming her unborn baby, then she should reconsider.  I also recommended she follow through with seeing the psychiatrist she has an appt with in July.  I agreed to do her FMLA paperwork again for her anxiety d/o. Also, she asked if I would write a work note stating no shifts longer than 6 hours while pregnant due to excessive pain in hips with 8 hour shifts---so I wrote this letter today as well.  Spent 30 min with pt today, with >50% of this time spent in counseling and care coordination regarding the above problems.  An After Visit Summary was printed and given  to the patient.  FOLLOW UP: Return in about 4 months (around 10/05/2016) for f/u anxiety/depression.  Signed:  Santiago BumpersPhil Jode Lippe, MD           06/05/2016

## 2016-06-05 NOTE — Progress Notes (Signed)
Pre visit review using our clinic review tool, if applicable. No additional management support is needed unless otherwise documented below in the visit note. 

## 2016-06-12 DIAGNOSIS — Z7689 Persons encountering health services in other specified circumstances: Secondary | ICD-10-CM

## 2016-06-26 ENCOUNTER — Encounter: Payer: Managed Care, Other (non HMO) | Attending: Obstetrics & Gynecology | Admitting: Skilled Nursing Facility1

## 2016-06-26 VITALS — Ht 61.0 in | Wt 175.0 lb

## 2016-06-26 DIAGNOSIS — O99810 Abnormal glucose complicating pregnancy: Secondary | ICD-10-CM | POA: Diagnosis present

## 2016-06-26 DIAGNOSIS — O24410 Gestational diabetes mellitus in pregnancy, diet controlled: Secondary | ICD-10-CM

## 2016-06-26 DIAGNOSIS — Z713 Dietary counseling and surveillance: Secondary | ICD-10-CM | POA: Diagnosis not present

## 2016-06-27 ENCOUNTER — Encounter: Payer: Self-pay | Admitting: Skilled Nursing Facility1

## 2016-06-27 NOTE — Progress Notes (Signed)
  Patient was seen on 06/27/2016 for Gestational Diabetes self-management class at the [facility]. The following learning objectives were met by the patient during this course:   States the definition of Gestational Diabetes  States why dietary management is important in controlling blood glucose  Describes the effects each nutrient has on blood glucose levels  Demonstrates ability to create a balanced meal plan  Demonstrates carbohydrate counting   States when to check blood glucose levels involving a total of 4 separate occurences in a day  Demonstrates proper blood glucose monitoring techniques  States the effect of stress and exercise on blood glucose levels  States the importance of limiting caffeine and abstaining from alcohol and smoking  Demonstrates the knowledge the glucometer provided in class may not be covered by their insurance and to call their insurance provider immediately after class to know which glucometer their insurance provider does cover as well as calling their physician the next day for a prescription to the glucometer their insurance does cover (if the one provided is not) as well as the lancets and strips for that meter.  Blood glucose monitor given: one touch verio flex Lot # [id number] x Exp: 2017/07/17 Blood glucose reading: 89  Patient instructed to monitor glucose levels: FBS: 60 - <90 1 hour: <140 2 hour: <120  *Patient received handouts:  Nutrition Diabetes and Pregnancy  Carbohydrate Counting List  Patient will be seen for follow-up as needed.

## 2016-07-10 ENCOUNTER — Encounter (HOSPITAL_COMMUNITY): Payer: Self-pay

## 2016-07-10 ENCOUNTER — Observation Stay (HOSPITAL_COMMUNITY)
Admission: AD | Admit: 2016-07-10 | Discharge: 2016-07-11 | Disposition: A | Discharge status: Home / Self Care | Payer: Managed Care, Other (non HMO) | Source: Ambulatory Visit | Attending: Obstetrics & Gynecology | Admitting: Obstetrics & Gynecology

## 2016-07-10 ENCOUNTER — Inpatient Hospital Stay (HOSPITAL_COMMUNITY): Payer: Managed Care, Other (non HMO)

## 2016-07-10 DIAGNOSIS — Z3A32 32 weeks gestation of pregnancy: Secondary | ICD-10-CM | POA: Diagnosis not present

## 2016-07-10 DIAGNOSIS — O4700 False labor before 37 completed weeks of gestation, unspecified trimester: Secondary | ICD-10-CM | POA: Diagnosis present

## 2016-07-10 DIAGNOSIS — Z3A31 31 weeks gestation of pregnancy: Secondary | ICD-10-CM

## 2016-07-10 DIAGNOSIS — R102 Pelvic and perineal pain: Secondary | ICD-10-CM | POA: Diagnosis present

## 2016-07-10 DIAGNOSIS — O4703 False labor before 37 completed weeks of gestation, third trimester: Secondary | ICD-10-CM | POA: Diagnosis not present

## 2016-07-10 DIAGNOSIS — O479 False labor, unspecified: Secondary | ICD-10-CM | POA: Diagnosis present

## 2016-07-10 LAB — CBC
HEMATOCRIT: 35.9 % — AB (ref 36.0–46.0)
HEMOGLOBIN: 11.9 g/dL — AB (ref 12.0–15.0)
MCH: 27.5 pg (ref 26.0–34.0)
MCHC: 33.1 g/dL (ref 30.0–36.0)
MCV: 82.9 fL (ref 78.0–100.0)
Platelets: 263 10*3/uL (ref 150–400)
RBC: 4.33 MIL/uL (ref 3.87–5.11)
RDW: 14 % (ref 11.5–15.5)
WBC: 10 10*3/uL (ref 4.0–10.5)

## 2016-07-10 LAB — URINALYSIS, ROUTINE W REFLEX MICROSCOPIC
Bilirubin Urine: NEGATIVE
Glucose, UA: NEGATIVE mg/dL
Hgb urine dipstick: NEGATIVE
Ketones, ur: NEGATIVE mg/dL
LEUKOCYTES UA: NEGATIVE
NITRITE: NEGATIVE
PROTEIN: NEGATIVE mg/dL
Specific Gravity, Urine: <1.005 — ABNORMAL LOW (ref 1.005–1.030)
pH: 5.5 (ref 5.0–8.0)

## 2016-07-10 LAB — GLUCOSE, CAPILLARY
GLUCOSE-CAPILLARY: 181 mg/dL — AB (ref 65–99)
Glucose-Capillary: 140 mg/dL — ABNORMAL HIGH (ref 65–99)

## 2016-07-10 LAB — TYPE AND SCREEN
ABO/RH(D): O POS
ANTIBODY SCREEN: NEGATIVE

## 2016-07-10 LAB — FETAL FIBRONECTIN: FETAL FIBRONECTIN: NEGATIVE

## 2016-07-10 LAB — ABO/RH: ABO/RH(D): O POS

## 2016-07-10 MED ORDER — NIFEDIPINE 10 MG PO CAPS
10.0000 mg | ORAL_CAPSULE | Freq: Four times a day (QID) | ORAL | Status: DC
  Administered 2016-07-10 – 2016-07-11 (×4): 10 mg via ORAL
  Filled 2016-07-10 (×4): qty 1

## 2016-07-10 MED ORDER — PRENATAL MULTIVITAMIN CH
1.0000 | ORAL_TABLET | Freq: Every day | ORAL | Status: DC
  Administered 2016-07-10 – 2016-07-11 (×2): 1 via ORAL
  Filled 2016-07-10 (×2): qty 1

## 2016-07-10 MED ORDER — DOCUSATE SODIUM 100 MG PO CAPS
100.0000 mg | ORAL_CAPSULE | Freq: Every day | ORAL | Status: DC
  Administered 2016-07-11: 100 mg via ORAL
  Filled 2016-07-10: qty 1

## 2016-07-10 MED ORDER — ZOLPIDEM TARTRATE 5 MG PO TABS: 5.0000 mg | ORAL_TABLET | Freq: Every evening | ORAL | Status: DC | PRN

## 2016-07-10 MED ORDER — CALCIUM CARBONATE ANTACID 500 MG PO CHEW
2.0000 | CHEWABLE_TABLET | ORAL | Status: DC | PRN
  Administered 2016-07-10 – 2016-07-11 (×2): 400 mg via ORAL
  Filled 2016-07-10 (×2): qty 2

## 2016-07-10 MED ORDER — ACETAMINOPHEN 325 MG PO TABS
650.0000 mg | ORAL_TABLET | ORAL | Status: DC | PRN
  Administered 2016-07-10: 650 mg via ORAL
  Filled 2016-07-10: qty 2

## 2016-07-10 MED ORDER — BETAMETHASONE SOD PHOS & ACET 6 (3-3) MG/ML IJ SUSP
12.0000 mg | INTRAMUSCULAR | Status: AC
  Administered 2016-07-10 – 2016-07-11 (×2): 12 mg via INTRAMUSCULAR
  Filled 2016-07-10 (×2): qty 2

## 2016-07-10 MED ORDER — NIFEDIPINE 10 MG PO CAPS
10.0000 mg | ORAL_CAPSULE | Freq: Once | ORAL | Status: AC
  Administered 2016-07-10: 10 mg via ORAL
  Filled 2016-07-10: qty 1

## 2016-07-10 MED ORDER — NIFEDIPINE 10 MG PO CAPS
20.0000 mg | ORAL_CAPSULE | Freq: Once | ORAL | Status: AC
  Administered 2016-07-10: 20 mg via ORAL
  Filled 2016-07-10: qty 2

## 2016-07-10 NOTE — MAU Provider Note (Signed)
History     CSN: [id number]  Arrival date and time: 07/10/16 1001   First Provider Initiated Contact with Patient 07/10/16 1038      Chief Complaint  Patient presents with  . Abdominal Cramping   HPI This is a 30 y.o. female at [redacted]w[redacted]d who presents with c/o decreased fetal movement since this morning (had excessive fetal movement last night) as well as uterine cramping for the past few days.  States they feel like menstrual cramps  No history of this in past.   RN Note: Abdominal cramping worsening yesterday on left side.  Baby was moving more than usual last night.  OB History    Gravida Para Term Preterm AB Living   2       1     SAB TAB Ectopic Multiple Live Births   1              Past Medical History:  Diagnosis Date  . Bicornate uterus    Renal u/s 11/2015 to eval for renal abnormalities: NORMAL  . GAD (generalized anxiety disorder)   . SAB (spontaneous abortion) 10/2015  . Seasonal allergic rhinitis     Past Surgical History:  Procedure Laterality Date  . DILATION AND EVACUATION  10/26/15   for missed abortion  . DILATION AND EVACUATION N/A 10/27/2015   Procedure: DILATATION AND EVACUATION ULTRASOUND;  Surgeon: Marlow Baars, MD;  Location: [facility];  Service: Gynecology;  Laterality: N/A;  Ultrasound called on 10/26/15 by Donne Hazel RN  . WISDOM TOOTH EXTRACTION  2 YEARS AGO    Family History  Problem Relation Age of Onset  . Cancer Maternal Grandfather     "stomach"  . Diabetes Paternal Grandfather   . Stroke Paternal Grandfather     Social History  Substance Use Topics  . Smoking status: Never Smoker  . Smokeless tobacco: Never Used  . Alcohol use No    Allergies:  Allergies  Allergen Reactions  . Nuvaring [Etonogestrel-Ethinyl Estradiol]     Vaginal burning, HA, bloating, mood change, nose bleed    Prescriptions Prior to Admission  Medication Sig Dispense Refill Last Dose  . acetaminophen (TYLENOL) 500 MG tablet Take 500 mg by mouth every  6 (six) hours as needed.   07/09/2016 at Unknown time  . Docosahexaenoic Acid (PRENATAL DHA PO) Take 1 capsule by mouth daily.   07/09/2016 at Unknown time  . cetirizine (ZYRTEC) 10 MG tablet Take 10 mg by mouth daily.   07/08/2016    Review of Systems  Constitutional: Negative for chills and fever.  Gastrointestinal: Positive for abdominal pain. Negative for constipation, diarrhea, nausea and vomiting.  Genitourinary: Negative for dysuria.  Musculoskeletal: Negative for myalgias.  Neurological: Negative for dizziness.   Other systems negative  Physical Exam   Temperature 98.3 F (36.8 C), temperature source Oral, resp. rate 16, last menstrual period 07/20/2015, unknown if currently breastfeeding.  Physical Exam  Constitutional: She is oriented to person, place, and time. She appears well-developed and well-nourished. No distress.  HENT:  Head: Normocephalic.  Cardiovascular: Normal rate and regular rhythm.   Respiratory: Effort normal. No respiratory distress.  GI: Soft. She exhibits no distension. There is no tenderness. There is no rebound and no guarding.  Genitourinary: Vagina normal.  Genitourinary Comments: Cervix closed/40-50%/-3/vertex soft  Musculoskeletal: Normal range of motion.  Neurological: She is alert and oriented to person, place, and time.  Skin: Skin is warm and dry.  Psychiatric: She has a normal mood and affect.  MAU Course  Procedures  MDM Consulted Dr Philippa Sicks with presentation, exam findings \ Will give Procardia PO for contractions Will get cervical length [place]  >>  Cx length 2.3cm with some dynamic changes Reconsulted Dr Mora Appl:  Admit to antenatal with Procardia and steroids   Assessment and Plan  SIUP at [redacted]w[redacted]d  Preterm contractions with cervical change  Admit to Antenatal  Routine orders Procardia Betamethasone series  [facility] 07/10/2016, 11:10 AM

## 2016-07-10 NOTE — MAU Note (Signed)
Abdominal cramping worsening yesterday on left side.  Baby was moving more than usual last night.

## 2016-07-11 LAB — GLUCOSE, CAPILLARY
GLUCOSE-CAPILLARY: 106 mg/dL — AB (ref 65–99)
Glucose-Capillary: 131 mg/dL — ABNORMAL HIGH (ref 65–99)

## 2016-07-11 MED ORDER — NIFEDIPINE 10 MG PO CAPS: 10.0000 mg | ORAL_CAPSULE | Freq: Four times a day (QID) | ORAL | 0 refills | Status: DC

## 2016-07-11 NOTE — Progress Notes (Signed)
Pt states that she is feeling no contractions. She has +FM. Will plan on discharge to home after second betamethasone has been given.  VSSAF IMP/ IUP at 32 wks with PTL. Plan/ Administer second Betamethasone.

## 2016-07-11 NOTE — Discharge Instructions (Signed)
Braxton Hicks Contractions °Contractions of the uterus can occur throughout pregnancy. Contractions are not always a sign that you are in labor.  °WHAT ARE BRAXTON HICKS CONTRACTIONS?  °Contractions that occur before labor are called Braxton Hicks contractions, or false labor. Toward the end of pregnancy (32-34 weeks), these contractions can develop more often and may become more forceful. This is not true labor because these contractions do not result in opening (dilatation) and thinning of the cervix. They are sometimes difficult to tell apart from true labor because these contractions can be forceful and people have different pain tolerances. You should not feel embarrassed if you go to the hospital with false labor. Sometimes, the only way to tell if you are in true labor is for your health care provider to look for changes in the cervix. °If there are no prenatal problems or other health problems associated with the pregnancy, it is completely safe to be sent home with false labor and await the onset of true labor. °HOW CAN YOU TELL THE DIFFERENCE BETWEEN TRUE AND FALSE LABOR? °False Labor °· The contractions of false labor are usually shorter and not as hard as those of true labor.   °· The contractions are usually irregular.   °· The contractions are often felt in the front of the lower abdomen and in the groin.   °· The contractions may go away when you walk around or change positions while lying down.   °· The contractions get weaker and are shorter lasting as time goes on.   °· The contractions do not usually become progressively stronger, regular, and closer together as with true labor.   °True Labor °· Contractions in true labor last 30-70 seconds, become very regular, usually become more intense, and increase in frequency.   °· The contractions do not go away with walking.   °· The discomfort is usually felt in the top of the uterus and spreads to the lower abdomen and low back.   °· True labor can be  determined by your health care provider with an exam. This will show that the cervix is dilating and getting thinner.   °WHAT TO REMEMBER °· Keep up with your usual exercises and follow other instructions given by your health care provider.   °· Take medicines as directed by your health care provider.   °· Keep your regular prenatal appointments.   °· Eat and drink lightly if you think you are going into labor.   °· If Braxton Hicks contractions are making you uncomfortable:   °¨ Change your position from lying down or resting to walking, or from walking to resting.   °¨ Sit and rest in a tub of warm water.   °¨ Drink 2-3 glasses of water. Dehydration may cause these contractions.   °¨ Do slow and deep breathing several times an hour.   °WHEN SHOULD I SEEK IMMEDIATE MEDICAL CARE? °Seek immediate medical care if: °· Your contractions become stronger, more regular, and closer together.   °· You have fluid leaking or gushing from your vagina.   °· You have a fever.   °· You pass blood-tinged mucus.   °· You have vaginal bleeding.   °· You have continuous abdominal pain.   °· You have low back pain that you never had before.   °· You feel your baby's head pushing down and causing pelvic pressure.   °· Your baby is not moving as much as it used to.   °  °This information is not intended to replace advice given to you by your health care provider. Make sure you discuss any questions you have with your health care   provider. °  °Document Released: 12/04/2005 Document Revised: 12/09/2013 Document Reviewed: 09/15/2013 °Elsevier Interactive Patient Education ©2016 [employer] ° °Preterm Labor Information °Preterm labor is when labor starts at less than 37 weeks of pregnancy. The normal length of a pregnancy is 39 to 41 weeks. °CAUSES °Often, there is no identifiable underlying cause as to why a woman goes into preterm labor. One of the most common known causes of preterm labor is infection. Infections of the uterus, cervix,  vagina, amniotic sac, bladder, kidney, or even the lungs (pneumonia) can cause labor to start. Other suspected causes of preterm labor include:  °· Urogenital infections, such as yeast infections and bacterial vaginosis.   °· Uterine abnormalities (uterine shape, uterine septum, fibroids, or bleeding from the placenta).   °· A cervix that has been operated on (it may fail to stay closed).   °· Malformations in the fetus.   °· Multiple gestations (twins, triplets, and so on).   °· Breakage of the amniotic sac.   °RISK FACTORS °· Having a previous history of preterm labor.   °· Having premature rupture of membranes (PROM).   °· Having a placenta that covers the opening of the cervix (placenta previa).   °· Having a placenta that separates from the uterus (placental abruption).   °· Having a cervix that is too weak to hold the fetus in the uterus (incompetent cervix).   °· Having too much fluid in the amniotic sac (polyhydramnios).   °· Taking illegal drugs or smoking while pregnant.   °· Not gaining enough weight while pregnant.   °· Being younger than 18 and older than 30 years old.   °· Having a low socioeconomic status.   °· Being African American. °SYMPTOMS °Signs and symptoms of preterm labor include:  °· Menstrual-like cramps, abdominal pain, or back pain. °· Uterine contractions that are regular, as frequent as six in an hour, regardless of their intensity (may be mild or painful). °· Contractions that start on the top of the uterus and spread down to the lower abdomen and back.   °· A sense of increased pelvic pressure.   °· A watery or bloody mucus discharge that comes from the vagina.   °TREATMENT °Depending on the length of the pregnancy and other circumstances, your health care provider may suggest bed rest. If necessary, there are medicines that can be given to stop contractions and to mature the fetal lungs. If labor happens before 34 weeks of pregnancy, a prolonged hospital stay may be recommended.  Treatment depends on the condition of both you and the fetus.  °WHAT SHOULD YOU DO IF YOU THINK YOU ARE IN PRETERM LABOR? °Call your health care provider right away. You will need to go to the hospital to get checked immediately. °HOW CAN YOU PREVENT PRETERM LABOR IN FUTURE PREGNANCIES? °You should:  °· Stop smoking if you smoke.  °· Maintain healthy weight gain and avoid chemicals and drugs that are not necessary. °· Be watchful for any type of infection. °· Inform your health care provider if you have a known history of preterm labor. °  °This information is not intended to replace advice given to you by your health care provider. Make sure you discuss any questions you have with your health care provider. °  °Document Released: 02/24/2004 Document Revised: 08/06/2013 Document Reviewed: 01/06/2013 °Elsevier Interactive Patient Education ©2016 Elsevier Inc. ° °

## 2016-07-11 NOTE — H&P (Signed)
This is a 30 y.o. female at [redacted]w[redacted]d who presents with c/o decreased fetal movement since this morning (had excessive fetal movement last night) as well as uterine cramping for the past few days.  States they feel like menstrual cramps  No history of this in past.   RN Note: Abdominal cramping worsening yesterday on left side. Baby was moving more than usual last night.          OB History    Gravida Para Term Preterm AB Living   2       1     SAB TAB Ectopic Multiple Live Births   1                  Past Medical History:  Diagnosis Date  . Bicornate uterus    Renal u/s 11/2015 to eval for renal abnormalities: NORMAL  . GAD (generalized anxiety disorder)   . SAB (spontaneous abortion) 10/2015  . Seasonal allergic rhinitis          Past Surgical History:  Procedure Laterality Date  . DILATION AND EVACUATION  10/26/15   for missed abortion  . DILATION AND EVACUATION N/A 10/27/2015   Procedure: DILATATION AND EVACUATION ULTRASOUND;  Surgeon: Marlow Baars, MD;  Location: [facility];  Service: Gynecology;  Laterality: N/A;  Ultrasound called on 10/26/15 by Donne Hazel RN  . WISDOM TOOTH EXTRACTION  2 YEARS AGO          Family History  Problem Relation Age of Onset  . Cancer Maternal Grandfather     "stomach"  . Diabetes Paternal Grandfather   . Stroke Paternal Grandfather         Social History  Substance Use Topics  . Smoking status: Never Smoker  . Smokeless tobacco: Never Used  . Alcohol use No    Allergies:       Allergies  Allergen Reactions  . Nuvaring [Etonogestrel-Ethinyl Estradiol]     Vaginal burning, HA, bloating, mood change, nose bleed           Prescriptions Prior to Admission  Medication Sig Dispense Refill Last Dose  . acetaminophen (TYLENOL) 500 MG tablet Take 500 mg by mouth every 6 (six) hours as needed.   07/09/2016 at Unknown time  . Docosahexaenoic Acid (PRENATAL DHA PO) Take 1 capsule by mouth daily.    07/09/2016 at Unknown time  . cetirizine (ZYRTEC) 10 MG tablet Take 10 mg by mouth daily.   07/08/2016    Review of Systems  Constitutional: Negative for chills and fever.  Gastrointestinal: Positive for abdominal pain. Negative for constipation, diarrhea, nausea and vomiting.  Genitourinary: Negative for dysuria.  Musculoskeletal: Negative for myalgias.  Neurological: Negative for dizziness.   Other systems negative  Physical Exam   Temperature 98.3 F (36.8 C), temperature source Oral, resp. rate 16, last menstrual period 07/20/2015, unknown if currently breastfeeding.  Physical Exam  Constitutional: She is oriented to person, place, and time. She appears well-developed and well-nourished. No distress.  HENT:  Head: Normocephalic.  Cardiovascular: Normal rate and regular rhythm.   Respiratory: Effort normal. No respiratory distress.  GI: Soft. She exhibits no distension. There is no tenderness. There is no rebound and no guarding.  Genitourinary: Vagina normal.  Genitourinary Comments: Cervix closed/40-50%/-3/vertex soft  Musculoskeletal: Normal range of motion.  Neurological: She is alert and oriented to person, place, and time.  Skin: Skin is warm and dry.  Psychiatric: She has a normal mood and affect.    A/P  29 up [id number] at 32 weeks with shortened cervix, regular contractions. (FFN negative) Admit to Antepartum for observation  Will give Procardia PO for contractions Will start Betamethasone for lung maturity Possible discharge home once steroid complete  Saturnino Liew STACIA

## 2016-07-11 NOTE — Discharge Summary (Signed)
  Pt was admitted for observation after she was found to have occ ctxs and a shortened cx. She was given Betamethasone and procardia. She had no further ctxs. She was discharged to home and will follow up on Friday in the office. She had not had any ctxs in >18 hours. NST reactive.

## 2016-07-11 NOTE — Progress Notes (Signed)
Pt remains stable .Will discharge to home and follow up in the office on Fri.

## 2016-07-13 ENCOUNTER — Ambulatory Visit (HOSPITAL_COMMUNITY): Payer: Managed Care, Other (non HMO) | Admitting: Licensed Clinical Social Worker

## 2016-07-17 ENCOUNTER — Ambulatory Visit (HOSPITAL_COMMUNITY): Payer: Managed Care, Other (non HMO) | Admitting: Psychiatry

## 2016-07-25 ENCOUNTER — Encounter (HOSPITAL_COMMUNITY): Payer: Self-pay | Admitting: Licensed Clinical Social Worker

## 2016-07-25 ENCOUNTER — Ambulatory Visit (INDEPENDENT_AMBULATORY_CARE_PROVIDER_SITE_OTHER): Payer: Managed Care, Other (non HMO) | Admitting: Licensed Clinical Social Worker

## 2016-07-25 DIAGNOSIS — F331 Major depressive disorder, recurrent, moderate: Secondary | ICD-10-CM

## 2016-07-25 DIAGNOSIS — F411 Generalized anxiety disorder: Secondary | ICD-10-CM

## 2016-07-25 DIAGNOSIS — F4010 Social phobia, unspecified: Secondary | ICD-10-CM | POA: Diagnosis not present
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
## 2016-08-01 ENCOUNTER — Ambulatory Visit (INDEPENDENT_AMBULATORY_CARE_PROVIDER_SITE_OTHER): Payer: Managed Care, Other (non HMO) | Admitting: Licensed Clinical Social Worker

## 2016-08-01 DIAGNOSIS — F331 Major depressive disorder, recurrent, moderate: Secondary | ICD-10-CM | POA: Diagnosis not present

## 2016-08-01 DIAGNOSIS — F4010 Social phobia, unspecified: Secondary | ICD-10-CM | POA: Diagnosis not present

## 2016-08-01 DIAGNOSIS — F411 Generalized anxiety disorder: Secondary | ICD-10-CM

## 2016-08-02 ENCOUNTER — Other Ambulatory Visit: Payer: Self-pay | Admitting: Obstetrics and Gynecology

## 2016-08-02 ENCOUNTER — Encounter (HOSPITAL_COMMUNITY): Payer: Self-pay | Admitting: Licensed Clinical Social Worker

## 2016-08-02 NOTE — Progress Notes (Signed)
   THERAPIST PROGRESS NOTE  Session Time:  3:10-4:00pm  Participation Level: Active  Behavioral Response: CasualAlertEuthymic  Type of Therapy: Individual Therapy  Treatment Goals addressed: Coping  Interventions: Strength-based and Supportive  Summary: Sandra Bauer is a 30 y.o. female who presents with anxiety and depression. Pt has 4 weeks left until her baby is born and her anxiety and depression are increasing. Pt has no supports except for her husband who works a full time job.Family lives in [place]. In-laws live in [place] but she doesn't want to ask them for help. Pt feels helpless with the inability to complete the nursery and prepare for the baby. Pt participated in a guided meditation. She shared that she was very relaxed and will use this as a coping tool for her anxiety. Pt discussed her medications after child birth. Pt was instructed to ask the psychiatrist medication questions. Appt is at the end of August. Also showed pt an app she can add to her iphone that has meditations that she can use with her phone. Pt was encouraged to attend an OP group that meets 2 evenings a week. PRocessed this with pt and she was in agreement to the suggestion.  Suicidal/Homicidal: No  Therapist Response: Assessed pt's current functioning and reviewed progress. Assisted pt processing issues with her depression, especially after childbirth, managing her anxiety and processing the management of her stressors.  Plan: Return again for outpatient group  Diagnosis: Axis I: Anxiety, depression, social anxiety        MACKENZIE,LISBETH S, LCAS-A 08/02/2016

## 2016-08-09 ENCOUNTER — Ambulatory Visit (INDEPENDENT_AMBULATORY_CARE_PROVIDER_SITE_OTHER): Payer: Managed Care, Other (non HMO) | Admitting: Licensed Clinical Social Worker

## 2016-08-09 DIAGNOSIS — F411 Generalized anxiety disorder: Secondary | ICD-10-CM

## 2016-08-10 NOTE — Progress Notes (Signed)
    Daily Group Progress Note  Program: Outpatient group  Group Time: 5:15 PM - 6:30 PM  Participation Level: Active  Behavioral Response: Appropriate  Type of Therapy:  Process Group  Summary of Progress: Patient presented on time and was engaged in discussion. This is patient's first session of group. Patient shared struggles with transitions due to being pregnant and anxieties related to this change. Patient interacted appropriately in the group and accepted feedback. Patient denies SI/HI or any current immediate needs.   Sandra GuilesJenny Shelonda Bauer, MSW, LCSW, LCAS

## 2016-08-16 ENCOUNTER — Encounter: Payer: Self-pay | Admitting: Family Medicine

## 2016-08-17 ENCOUNTER — Encounter (HOSPITAL_COMMUNITY): Payer: Self-pay

## 2016-08-17 ENCOUNTER — Ambulatory Visit (HOSPITAL_COMMUNITY): Payer: Self-pay | Admitting: Psychiatry

## 2016-08-31 ENCOUNTER — Encounter (HOSPITAL_COMMUNITY)
Admission: RE | Admit: 2016-08-31 | Discharge: 2016-08-31 | Disposition: A | Discharge status: Home / Self Care | Payer: Managed Care, Other (non HMO) | Source: Ambulatory Visit | Attending: Obstetrics | Admitting: Obstetrics

## 2016-08-31 LAB — BASIC METABOLIC PANEL
ANION GAP: 7 (ref 5–15)
BUN: 10 mg/dL (ref 6–20)
CALCIUM: 8.9 mg/dL (ref 8.9–10.3)
CO2: 19 mmol/L — ABNORMAL LOW (ref 22–32)
Chloride: 110 mmol/L (ref 101–111)
Creatinine, Ser: 0.48 mg/dL (ref 0.44–1.00)
Glucose, Bld: 128 mg/dL — ABNORMAL HIGH (ref 65–99)
Potassium: 3.7 mmol/L (ref 3.5–5.1)
SODIUM: 136 mmol/L (ref 135–145)

## 2016-08-31 LAB — TYPE AND SCREEN
ABO/RH(D): O POS
ANTIBODY SCREEN: NEGATIVE

## 2016-08-31 LAB — CBC
HEMATOCRIT: 36.9 % (ref 36.0–46.0)
HEMOGLOBIN: 12.3 g/dL (ref 12.0–15.0)
MCH: 27 pg (ref 26.0–34.0)
MCHC: 33.3 g/dL (ref 30.0–36.0)
MCV: 81.1 fL (ref 78.0–100.0)
Platelets: 231 10*3/uL (ref 150–400)
RBC: 4.55 MIL/uL (ref 3.87–5.11)
RDW: 15.3 % (ref 11.5–15.5)
WBC: 8.2 10*3/uL (ref 4.0–10.5)

## 2016-08-31 NOTE — Patient Instructions (Signed)
20 Sandra Bauer  08/31/2016   Your procedure is scheduled on:  09/01/2016  Enter through the Main Entrance of [facility] at 1045 AM.  Pick up the phone at the desk and dial [phone].   Call this number if you have problems the morning of surgery: [phone]   Remember:   Do not eat food:After Midnight.  Do not drink clear liquids: After Midnight.  Take these medicines the morning of surgery with A SIP OF WATER: none   Do not wear jewelry, make-up or nail polish.  Do not wear lotions, powders, or perfumes. Do not wear deodorant.  Do not shave 48 hours prior to surgery.  Do not bring valuables to the hospital.  [facility] is not   responsible for any belongings or valuables brought to the hospital.  Contacts, dentures or bridgework may not be worn into surgery.  Leave suitcase in the car. After surgery it may be brought to your room.  For patients admitted to the hospital, checkout time is 11:00 AM the day of              discharge.   Patients discharged the day of surgery will not be allowed to drive             home.  Name and phone number of your driver: none  Special Instructions:   N/A   Please read over the following fact sheets that you were given:   Surgical Site Infection Prevention

## 2016-09-01 ENCOUNTER — Encounter (HOSPITAL_COMMUNITY): Payer: Self-pay | Admitting: Anesthesiology

## 2016-09-01 ENCOUNTER — Encounter (HOSPITAL_COMMUNITY)
Admission: AD | Disposition: A | Discharge status: Home / Self Care | Payer: Self-pay | Source: Ambulatory Visit | Attending: Obstetrics

## 2016-09-01 ENCOUNTER — Inpatient Hospital Stay (HOSPITAL_COMMUNITY): Payer: Managed Care, Other (non HMO) | Admitting: Anesthesiology

## 2016-09-01 ENCOUNTER — Inpatient Hospital Stay (HOSPITAL_COMMUNITY)
Admission: AD | Admit: 2016-09-01 | Discharge: 2016-09-04 | DRG: 766 | Disposition: A | Discharge status: Home / Self Care | Payer: Managed Care, Other (non HMO) | Source: Ambulatory Visit | Attending: Obstetrics | Admitting: Obstetrics

## 2016-09-01 DIAGNOSIS — O3403 Maternal care for unspecified congenital malformation of uterus, third trimester: Secondary | ICD-10-CM | POA: Diagnosis present

## 2016-09-01 DIAGNOSIS — O321XX0 Maternal care for breech presentation, not applicable or unspecified: Principal | ICD-10-CM | POA: Diagnosis present

## 2016-09-01 DIAGNOSIS — O24420 Gestational diabetes mellitus in childbirth, diet controlled: Secondary | ICD-10-CM | POA: Diagnosis present

## 2016-09-01 DIAGNOSIS — Q513 Bicornate uterus: Secondary | ICD-10-CM

## 2016-09-01 DIAGNOSIS — Z3A39 39 weeks gestation of pregnancy: Secondary | ICD-10-CM

## 2016-09-01 LAB — RPR: RPR: NONREACTIVE

## 2016-09-01 LAB — GLUCOSE, CAPILLARY: GLUCOSE-CAPILLARY: 82 mg/dL (ref 65–99)

## 2016-09-01 SURGERY — Surgical Case
Anesthesia: Spinal

## 2016-09-01 MED ORDER — SODIUM CHLORIDE 0.9% FLUSH: 3.0000 mL | INTRAVENOUS | Status: DC | PRN

## 2016-09-01 MED ORDER — PRENATAL MULTIVITAMIN CH
1.0000 | ORAL_TABLET | Freq: Every day | ORAL | Status: DC
  Administered 2016-09-02 – 2016-09-04 (×3): 1 via ORAL
  Filled 2016-09-01 (×3): qty 1

## 2016-09-01 MED ORDER — IBUPROFEN 600 MG PO TABS
600.0000 mg | ORAL_TABLET | Freq: Four times a day (QID) | ORAL | Status: DC
  Administered 2016-09-01 – 2016-09-04 (×9): 600 mg via ORAL
  Filled 2016-09-01 (×11): qty 1

## 2016-09-01 MED ORDER — FENTANYL CITRATE (PF) 100 MCG/2ML IJ SOLN
INTRAMUSCULAR | Status: DC | PRN
  Administered 2016-09-01: 20 ug via INTRATHECAL

## 2016-09-01 MED ORDER — ALBUMIN HUMAN 5 % IV SOLN
INTRAVENOUS | Status: DC | PRN
  Administered 2016-09-01: 13:00:00 via INTRAVENOUS

## 2016-09-01 MED ORDER — LACTATED RINGERS IV SOLN
INTRAVENOUS | Status: DC
  Administered 2016-09-01 (×3): via INTRAVENOUS

## 2016-09-01 MED ORDER — ONDANSETRON HCL 4 MG/2ML IJ SOLN: 4.0000 mg | Freq: Three times a day (TID) | INTRAMUSCULAR | Status: DC | PRN

## 2016-09-01 MED ORDER — SIMETHICONE 80 MG PO CHEW
80.0000 mg | CHEWABLE_TABLET | ORAL | Status: DC
  Administered 2016-09-01 – 2016-09-04 (×3): 80 mg via ORAL
  Filled 2016-09-01 (×3): qty 1

## 2016-09-01 MED ORDER — LACTATED RINGERS IV SOLN
INTRAVENOUS | Status: DC
  Administered 2016-09-01: 23:00:00 via INTRAVENOUS

## 2016-09-01 MED ORDER — KETOROLAC TROMETHAMINE 30 MG/ML IJ SOLN
30.0000 mg | Freq: Four times a day (QID) | INTRAMUSCULAR | Status: AC | PRN
Start: 2016-09-01 — End: 2016-09-02
  Administered 2016-09-01: 30 mg via INTRAMUSCULAR

## 2016-09-01 MED ORDER — PHENYLEPHRINE 8 MG IN D5W 100 ML (0.08MG/ML) PREMIX OPTIME
INJECTION | INTRAVENOUS | Status: AC
  Filled 2016-09-01: qty 100

## 2016-09-01 MED ORDER — ONDANSETRON HCL 4 MG/2ML IJ SOLN
INTRAMUSCULAR | Status: AC
  Filled 2016-09-01: qty 2

## 2016-09-01 MED ORDER — MORPHINE SULFATE-NACL 0.5-0.9 MG/ML-% IV SOSY
PREFILLED_SYRINGE | INTRAVENOUS | Status: AC
  Filled 2016-09-01: qty 1

## 2016-09-01 MED ORDER — SCOPOLAMINE 1 MG/3DAYS TD PT72: 1.0000 | MEDICATED_PATCH | Freq: Once | TRANSDERMAL | Status: DC

## 2016-09-01 MED ORDER — ACETAMINOPHEN 500 MG PO TABS
1000.0000 mg | ORAL_TABLET | Freq: Four times a day (QID) | ORAL | Status: AC
  Administered 2016-09-01 – 2016-09-02 (×4): 1000 mg via ORAL
  Filled 2016-09-01 (×4): qty 2

## 2016-09-01 MED ORDER — NALOXONE HCL 2 MG/2ML IJ SOSY: 1.0000 ug/kg/h | PREFILLED_SYRINGE | INTRAVENOUS | Status: DC | PRN

## 2016-09-01 MED ORDER — SENNOSIDES-DOCUSATE SODIUM 8.6-50 MG PO TABS
2.0000 | ORAL_TABLET | ORAL | Status: DC
  Administered 2016-09-01 – 2016-09-04 (×3): 2 via ORAL
  Filled 2016-09-01 (×3): qty 2

## 2016-09-01 MED ORDER — KETOROLAC TROMETHAMINE 30 MG/ML IJ SOLN
INTRAMUSCULAR | Status: AC
  Filled 2016-09-01: qty 1

## 2016-09-01 MED ORDER — DIPHENHYDRAMINE HCL 50 MG/ML IJ SOLN: 12.5000 mg | INTRAMUSCULAR | Status: DC | PRN

## 2016-09-01 MED ORDER — PHENYLEPHRINE 8 MG IN D5W 100 ML (0.08MG/ML) PREMIX OPTIME
INJECTION | INTRAVENOUS | Status: DC | PRN
  Administered 2016-09-01: 60 ug/min via INTRAVENOUS

## 2016-09-01 MED ORDER — OXYTOCIN 10 UNIT/ML IJ SOLN
INTRAMUSCULAR | Status: AC
  Filled 2016-09-01: qty 4

## 2016-09-01 MED ORDER — MENTHOL 3 MG MT LOZG: 1.0000 | LOZENGE | OROMUCOSAL | Status: DC | PRN

## 2016-09-01 MED ORDER — COCONUT OIL OIL
1.0000 "application " | TOPICAL_OIL | Status: DC | PRN
  Administered 2016-09-04: 1 via TOPICAL
  Filled 2016-09-01: qty 120

## 2016-09-01 MED ORDER — MEPERIDINE HCL 25 MG/ML IJ SOLN: 6.2500 mg | INTRAMUSCULAR | Status: DC | PRN

## 2016-09-01 MED ORDER — BUPIVACAINE IN DEXTROSE 0.75-8.25 % IT SOLN
INTRATHECAL | Status: DC | PRN
  Administered 2016-09-01: 1.4 mL via INTRATHECAL

## 2016-09-01 MED ORDER — OXYTOCIN 10 UNIT/ML IJ SOLN
INTRAMUSCULAR | Status: DC | PRN
  Administered 2016-09-01: 40 [IU] via INTRAVENOUS

## 2016-09-01 MED ORDER — WITCH HAZEL-GLYCERIN EX PADS: 1.0000 "application " | MEDICATED_PAD | CUTANEOUS | Status: DC | PRN

## 2016-09-01 MED ORDER — LACTATED RINGERS IV SOLN
INTRAVENOUS | Status: DC | PRN
  Administered 2016-09-01: 13:00:00 via INTRAVENOUS

## 2016-09-01 MED ORDER — KETOROLAC TROMETHAMINE 30 MG/ML IJ SOLN: 30.0000 mg | Freq: Once | INTRAMUSCULAR | Status: DC

## 2016-09-01 MED ORDER — PHENYLEPHRINE HCL 10 MG/ML IJ SOLN
INTRAMUSCULAR | Status: DC | PRN
  Administered 2016-09-01: 120 ug via INTRAVENOUS
  Administered 2016-09-01: 80 ug via INTRAVENOUS

## 2016-09-01 MED ORDER — OXYCODONE HCL 5 MG PO TABS
5.0000 mg | ORAL_TABLET | ORAL | Status: DC | PRN
  Administered 2016-09-02 – 2016-09-04 (×5): 5 mg via ORAL
  Filled 2016-09-01 (×4): qty 1

## 2016-09-01 MED ORDER — OXYTOCIN 40 UNITS IN LACTATED RINGERS INFUSION - SIMPLE MED: 2.5000 [IU]/h | INTRAVENOUS | Status: AC

## 2016-09-01 MED ORDER — SCOPOLAMINE 1 MG/3DAYS TD PT72
MEDICATED_PATCH | TRANSDERMAL | Status: AC
  Administered 2016-09-01: 1.5 mg via TRANSDERMAL
  Filled 2016-09-01: qty 1

## 2016-09-01 MED ORDER — PROMETHAZINE HCL 25 MG/ML IJ SOLN: 6.2500 mg | INTRAMUSCULAR | Status: DC | PRN

## 2016-09-01 MED ORDER — PHENYLEPHRINE 40 MCG/ML (10ML) SYRINGE FOR IV PUSH (FOR BLOOD PRESSURE SUPPORT)
PREFILLED_SYRINGE | INTRAVENOUS | Status: AC
  Filled 2016-09-01: qty 10

## 2016-09-01 MED ORDER — TETANUS-DIPHTH-ACELL PERTUSSIS 5-2.5-18.5 LF-MCG/0.5 IM SUSP: 0.5000 mL | Freq: Once | INTRAMUSCULAR | Status: DC

## 2016-09-01 MED ORDER — NALBUPHINE HCL 10 MG/ML IJ SOLN: 5.0000 mg | INTRAMUSCULAR | Status: DC | PRN

## 2016-09-01 MED ORDER — IBUPROFEN 600 MG PO TABS
600.0000 mg | ORAL_TABLET | Freq: Four times a day (QID) | ORAL | Status: DC | PRN
Start: 2016-09-01 — End: 2016-09-04

## 2016-09-01 MED ORDER — FENTANYL CITRATE (PF) 100 MCG/2ML IJ SOLN
INTRAMUSCULAR | Status: AC
  Filled 2016-09-01: qty 2

## 2016-09-01 MED ORDER — OXYCODONE HCL 5 MG PO TABS
10.0000 mg | ORAL_TABLET | ORAL | Status: DC | PRN
  Administered 2016-09-02 – 2016-09-03 (×2): 10 mg via ORAL
  Filled 2016-09-01 (×3): qty 2

## 2016-09-01 MED ORDER — ERYTHROMYCIN 5 MG/GM OP OINT
TOPICAL_OINTMENT | OPHTHALMIC | Status: AC
  Filled 2016-09-01: qty 1

## 2016-09-01 MED ORDER — NALBUPHINE HCL 10 MG/ML IJ SOLN: 5.0000 mg | Freq: Once | INTRAMUSCULAR | Status: DC | PRN

## 2016-09-01 MED ORDER — HYDROMORPHONE HCL 1 MG/ML IJ SOLN: 0.2500 mg | INTRAMUSCULAR | Status: DC | PRN

## 2016-09-01 MED ORDER — DIBUCAINE 1 % RE OINT: 1.0000 "application " | TOPICAL_OINTMENT | RECTAL | Status: DC | PRN

## 2016-09-01 MED ORDER — ONDANSETRON HCL 4 MG/2ML IJ SOLN
INTRAMUSCULAR | Status: DC | PRN
  Administered 2016-09-01: 4 mg via INTRAVENOUS

## 2016-09-01 MED ORDER — SIMETHICONE 80 MG PO CHEW: 80.0000 mg | CHEWABLE_TABLET | ORAL | Status: DC | PRN

## 2016-09-01 MED ORDER — CEFAZOLIN SODIUM-DEXTROSE 2-4 GM/100ML-% IV SOLN
2.0000 g | INTRAVENOUS | Status: AC
  Administered 2016-09-01: 2 g via INTRAVENOUS

## 2016-09-01 MED ORDER — SCOPOLAMINE 1 MG/3DAYS TD PT72
1.0000 | MEDICATED_PATCH | Freq: Once | TRANSDERMAL | Status: DC
  Administered 2016-09-01: 1.5 mg via TRANSDERMAL

## 2016-09-01 MED ORDER — KETOROLAC TROMETHAMINE 30 MG/ML IJ SOLN: 30.0000 mg | Freq: Four times a day (QID) | INTRAMUSCULAR | Status: AC | PRN

## 2016-09-01 MED ORDER — SIMETHICONE 80 MG PO CHEW
80.0000 mg | CHEWABLE_TABLET | Freq: Three times a day (TID) | ORAL | Status: DC
  Administered 2016-09-01 – 2016-09-04 (×8): 80 mg via ORAL
  Filled 2016-09-01 (×8): qty 1

## 2016-09-01 MED ORDER — ALBUMIN HUMAN 5 % IV SOLN
INTRAVENOUS | Status: AC
  Filled 2016-09-01: qty 250

## 2016-09-01 MED ORDER — DIPHENHYDRAMINE HCL 25 MG PO CAPS
25.0000 mg | ORAL_CAPSULE | ORAL | Status: DC | PRN
  Filled 2016-09-01: qty 1

## 2016-09-01 MED ORDER — INFLUENZA VAC SPLIT QUAD 0.5 ML IM SUSY
0.5000 mL | PREFILLED_SYRINGE | INTRAMUSCULAR | Status: AC
  Administered 2016-09-03: 0.5 mL via INTRAMUSCULAR
  Filled 2016-09-01: qty 0.5

## 2016-09-01 MED ORDER — DIPHENHYDRAMINE HCL 25 MG PO CAPS: 25.0000 mg | ORAL_CAPSULE | Freq: Four times a day (QID) | ORAL | Status: DC | PRN

## 2016-09-01 MED ORDER — MORPHINE SULFATE-NACL 0.5-0.9 MG/ML-% IV SOSY
PREFILLED_SYRINGE | INTRAVENOUS | Status: DC | PRN
  Administered 2016-09-01: .2 mg via INTRATHECAL

## 2016-09-01 MED ORDER — LACTATED RINGERS IV SOLN
Freq: Once | INTRAVENOUS | Status: AC
  Administered 2016-09-01: 11:00:00 via INTRAVENOUS

## 2016-09-01 MED ORDER — SODIUM CHLORIDE 0.9 % IR SOLN
Status: DC | PRN
  Administered 2016-09-01: 1

## 2016-09-01 MED ORDER — NALOXONE HCL 0.4 MG/ML IJ SOLN: 0.4000 mg | INTRAMUSCULAR | Status: DC | PRN

## 2016-09-01 MED ORDER — ACETAMINOPHEN 325 MG PO TABS
650.0000 mg | ORAL_TABLET | ORAL | Status: DC | PRN
  Administered 2016-09-02 – 2016-09-04 (×4): 650 mg via ORAL
  Filled 2016-09-01 (×5): qty 2

## 2016-09-01 SURGICAL SUPPLY — 35 items
BENZOIN TINCTURE PRP APPL 2/3 (GAUZE/BANDAGES/DRESSINGS) ×2 IMPLANT
CHLORAPREP W/TINT 26ML (MISCELLANEOUS) ×2 IMPLANT
CLAMP CORD UMBIL (MISCELLANEOUS) IMPLANT
CLOTH BEACON ORANGE TIMEOUT ST (SAFETY) ×2 IMPLANT
DRSG OPSITE POSTOP 4X10 (GAUZE/BANDAGES/DRESSINGS) ×2 IMPLANT
ELECT REM PT RETURN 9FT ADLT (ELECTROSURGICAL) ×2
ELECTRODE REM PT RTRN 9FT ADLT (ELECTROSURGICAL) ×1 IMPLANT
EXTRACTOR VACUUM KIWI (MISCELLANEOUS) IMPLANT
GLOVE BIO SURGEON STRL SZ 6 (GLOVE) ×2 IMPLANT
GLOVE BIOGEL PI IND STRL 6.5 (GLOVE) ×1 IMPLANT
GLOVE BIOGEL PI IND STRL 7.0 (GLOVE) ×1 IMPLANT
GLOVE BIOGEL PI INDICATOR 6.5 (GLOVE) ×1
GLOVE BIOGEL PI INDICATOR 7.0 (GLOVE) ×1
GOWN STRL REUS W/TWL LRG LVL3 (GOWN DISPOSABLE) ×4 IMPLANT
KIT ABG SYR 3ML LUER SLIP (SYRINGE) IMPLANT
NEEDLE HYPO 25X5/8 SAFETYGLIDE (NEEDLE) IMPLANT
NS IRRIG 1000ML POUR BTL (IV SOLUTION) ×2 IMPLANT
PACK C SECTION WH (CUSTOM PROCEDURE TRAY) ×2 IMPLANT
PAD OB MATERNITY 4.3X12.25 (PERSONAL CARE ITEMS) ×2 IMPLANT
PENCIL SMOKE EVAC W/HOLSTER (ELECTROSURGICAL) ×2 IMPLANT
RTRCTR C-SECT PINK 25CM LRG (MISCELLANEOUS) ×2 IMPLANT
STRIP CLOSURE SKIN 1/2X4 (GAUZE/BANDAGES/DRESSINGS) ×2 IMPLANT
SUT MNCRL 0 VIOLET CTX 36 (SUTURE) ×2 IMPLANT
SUT MNCRL AB 3-0 PS2 27 (SUTURE) ×2 IMPLANT
SUT MONOCRYL 0 CTX 36 (SUTURE) ×2
SUT PLAIN 0 NONE (SUTURE) IMPLANT
SUT PLAIN 2 0 (SUTURE) ×1
SUT PLAIN ABS 2-0 CT1 27XMFL (SUTURE) ×1 IMPLANT
SUT VIC AB 0 CTX 36 (SUTURE) ×2
SUT VIC AB 0 CTX36XBRD ANBCTRL (SUTURE) ×2 IMPLANT
SUT VIC AB 2-0 CT1 (SUTURE) ×2 IMPLANT
SUT VIC AB 2-0 CT1 27 (SUTURE) ×1
SUT VIC AB 2-0 CT1 TAPERPNT 27 (SUTURE) ×1 IMPLANT
TOWEL OR 17X24 6PK STRL BLUE (TOWEL DISPOSABLE) ×2 IMPLANT
TRAY FOLEY CATH SILVER 14FR (SET/KITS/TRAYS/PACK) ×2 IMPLANT

## 2016-09-01 NOTE — Transfer of Care (Signed)
Immediate Anesthesia Transfer of Care Note  Patient: Sandra Bauer  Procedure(s) Performed: Procedure(s): CESAREAN SECTION (N/A)  Patient Location: PACU  Anesthesia Type:Spinal  Level of Consciousness: awake, alert , oriented and patient cooperative  Airway & Oxygen Therapy: Patient Spontanous Breathing  Post-op Assessment: Report given to RN and Post -op Vital signs reviewed and stable  Post vital signs: Reviewed and stable--SBP in 90s and patient unsymptomatic prior to leaving patient in care of PACU RN.  Last Vitals:  Vitals:   09/01/16 1320 09/01/16 1330  BP: (!) 84/54 95/62  Pulse: 62 65  Resp: 16 19  Temp: 36.8 C     Last Pain:  Vitals:   09/01/16 1330  TempSrc:   PainSc: 0-No pain      Patients Stated Pain Goal: 3 (09/01/16 1134)  Complications: No apparent anesthesia complications

## 2016-09-01 NOTE — Anesthesia Postprocedure Evaluation (Signed)
Anesthesia Post Note  Patient: Sandra Bauer  Procedure(s) Performed: Procedure(s) (LRB): CESAREAN SECTION (N/A)  Patient location during evaluation: Mother Baby Anesthesia Type: Spinal Level of consciousness: awake, awake and alert, oriented and patient cooperative Pain management: pain level controlled Vital Signs Assessment: post-procedure vital signs reviewed and stable Respiratory status: spontaneous breathing, nonlabored ventilation and respiratory function stable Cardiovascular status: stable Postop Assessment: no headache, no backache, no signs of nausea or vomiting and patient able to bend at knees Anesthetic complications: no     Last Vitals:  Vitals:   09/01/16 1415 09/01/16 1448  BP: 102/77 (!) 104/55  Pulse: 70 60  Resp: (!) 23 16  Temp:  36.8 C    Last Pain:  Vitals:   09/01/16 1500  TempSrc:   PainSc: 0-No pain   Pain Goal: Patients Stated Pain Goal: 3 (09/01/16 1134)               [name]

## 2016-09-01 NOTE — Anesthesia Postprocedure Evaluation (Signed)
Anesthesia Post Note  Patient: Sandra Bauer  Procedure(s) Performed: Procedure(s) (LRB): CESAREAN SECTION (N/A)  Patient location during evaluation: PACU Anesthesia Type: Spinal Level of consciousness: awake Pain management: pain level controlled Vital Signs Assessment: post-procedure vital signs reviewed and stable Respiratory status: spontaneous breathing Cardiovascular status: stable Postop Assessment: no headache, spinal receding, no backache, patient able to bend at knees and no signs of nausea or vomiting Anesthetic complications: no     Last Vitals:  Vitals:   09/01/16 1400 09/01/16 1415  BP: (!) 93/59 102/77  Pulse: 65 70  Resp: 13 (!) 23  Temp:      Last Pain:  Vitals:   09/01/16 1415  TempSrc:   PainSc: 0-No pain   Pain Goal: Patients Stated Pain Goal: 3 (09/01/16 1134)               Dareion Kneece JR,JOHN Susann GivensFRANKLIN

## 2016-09-01 NOTE — Anesthesia Preprocedure Evaluation (Signed)
Anesthesia Evaluation  Patient identified by MRN, date of birth, ID band Patient awake    Reviewed: Allergy & Precautions, H&P , NPO status , Patient's Chart, lab work & pertinent test results  Airway Mallampati: I  TM Distance: >3 FB Neck ROM: full    Dental no notable dental hx.    Pulmonary neg pulmonary ROS,    Pulmonary exam normal        Cardiovascular negative cardio ROS Normal cardiovascular exam     Neuro/Psych negative neurological ROS  negative psych ROS   GI/Hepatic negative GI ROS, Neg liver ROS,   Endo/Other  negative endocrine ROSdiabetes  Renal/GU negative Renal ROS     Musculoskeletal   Abdominal (+) + obese,   Peds  Hematology negative hematology ROS (+)   Anesthesia Other Findings   Reproductive/Obstetrics (+) Pregnancy                             Anesthesia Physical Anesthesia Plan  ASA: II  Anesthesia Plan: Spinal   Post-op Pain Management:    Induction:   Airway Management Planned:   Additional Equipment:   Intra-op Plan:   Post-operative Plan:   Informed Consent: I have reviewed the patients History and Physical, chart, labs and discussed the procedure including the risks, benefits and alternatives for the proposed anesthesia with the patient or authorized representative who has indicated his/her understanding and acceptance.     Plan Discussed with: CRNA and Surgeon  Anesthesia Plan Comments:         Anesthesia Quick Evaluation

## 2016-09-01 NOTE — Addendum Note (Signed)
Addendum  created 09/01/16 1558 by Kalum Minner L Dajohn Ellender, CRNA   Sign clinical note    

## 2016-09-01 NOTE — Anesthesia Procedure Notes (Signed)
Spinal  Patient location during procedure: OR Start time: 09/01/2016 12:11 PM End time: 09/01/2016 12:15 PM Staffing Anesthesiologist: Leilani AbleHATCHETT, Calais Svehla Performed: anesthesiologist  Preanesthetic Checklist Completed: patient identified, surgical consent, pre-op evaluation, timeout performed, IV checked, risks and benefits discussed and monitors and equipment checked Spinal Block Patient position: sitting Prep: site prepped and draped and DuraPrep Patient monitoring: heart rate, cardiac monitor, continuous pulse ox and blood pressure Approach: midline Location: L3-4 Injection technique: single-shot Needle Needle type: Sprotte  Needle gauge: 24 G Needle length: 9 cm Needle insertion depth: 6 cm Assessment Sensory level: T4

## 2016-09-01 NOTE — Lactation Note (Signed)
This note was copied from a baby's chart. Lactation Consultation Note  Patient Name: Sandra Bauer NWGNF'AToday's Date: 09/01/2016 Reason for consult: Initial assessment   Initial consult with first time mom of < 1 hour old infant. Infant was latched to breast STS in PACU. Infant with flanged lips, rhythmic suckling and intermittent swallows. Infant came off a few times and relatched easily.  Mom unable to sit up as she is feeling nauseated and light headed.   Mom with small compressible breasts and everted nipples, mom reports + breast growth with pregnancy. Mom with history of GDM. Enc mom to hand express prior to each feeding, she will need to be shown hand expression. Parents reports they took a BF class at [employer].   Enc mom to feed 8-12 x in 24 hours at first feeding cues, mom will need assistance with learning cross cradle and football hold. Enc mom to call for assistance as needed. Discussed feeding cues to watch for.   BF Resources and [facility] given, informed of IP/OP Services, BF Support Groups and LC phone #. Mom has a Medela PIS at home. Follow up tomorrow and prn.      Maternal Data Formula Feeding for Exclusion: No Does the patient have breastfeeding experience prior to this delivery?: No  Feeding Feeding Type: Breast Fed Length of feed: 25 min  LATCH Score/Interventions Latch: Grasps breast easily, tongue down, lips flanged, rhythmical sucking.  Audible Swallowing: A few with stimulation Intervention(s): Skin to skin;Alternate breast massage  Type of Nipple: Everted at rest and after stimulation  Comfort (Breast/Nipple): Soft / non-tender     Hold (Positioning): Assistance needed to correctly position infant at breast and maintain latch. Intervention(s): Breastfeeding basics reviewed;Support Pillows;Position options;Skin to skin  LATCH Score: 8  Lactation Tools Discussed/Used WIC Program: No   Consult Status Consult Status: Follow-up Date:  09/02/16    Silas FloodSharon S Urie Loughner 09/01/2016, 1:55 PM

## 2016-09-01 NOTE — H&P (Signed)
30 y.o. G2P0010 @ 4240w3d presents for cesarean section for breech presentation.  Otherwise has good fetal movement and no bleeding.  Pregnancy c/b: 1. GDMA1:  EFW 8/30 was 5lb 10 oz (21%) 2. 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 Anxiety   . Bicornate uterus    Renal u/s 11/2015 to eval for renal abnormalities: NORMAL  . Depression   . GAD (generalized anxiety disorder)   . Gestational diabetes   . Gestational diabetes mellitus 07/2016   Plan is for C/S due to breech presentation + uterine anomaly.  . SAB (spontaneous abortion) 10/2015  . Seasonal allergic rhinitis     Past Surgical History:  Procedure Laterality Date  . DILATION AND EVACUATION  10/26/15   for missed abortion  . DILATION AND EVACUATION N/A 10/27/2015   Procedure: DILATATION AND EVACUATION ULTRASOUND;  Surgeon: Marlow Baarsyanna Robbi Spells, MD;  Location: [facility];  Service: Gynecology;  Laterality: N/A;  Ultrasound called on 10/26/15 by Donne Hazelhassity Stewart RN  . WISDOM TOOTH EXTRACTION  2 YEARS AGO    OB History  Gravida Para Term Preterm AB Living  2       1    SAB TAB Ectopic Multiple Live Births  1            # Outcome Date GA Lbr Len/2nd Weight Sex Delivery Anes PTL Lv  2 Current           1 SAB               Social History   Social History  . Marital status: Married    Spouse name: N/A  . Number of children: N/A  . Years of education: N/A   Occupational History  . Not on file.   Social History Main Topics  . Smoking status: Never Smoker  . Smokeless tobacco: Never Used  . Alcohol use No  . Drug use: No  . Sexual activity: Yes    Birth control/ protection: None   Other Topics Concern  . Not on file   Social History Narrative   Married 2015.   Orig from [place], lives in [place].   Has three younger sisters in [place].   Attending nursing school, works full time at [employer] in [place].   No T/A/Ds.   Nuvaring [etonogestrel-ethinyl estradiol]    Prenatal  Transfer Tool  Maternal Diabetes: Yes:  Diabetes Type:  Diet controlled Genetic Screening: Normal Maternal Ultrasounds/Referrals: Normal Fetal Ultrasounds or other Referrals:  None Maternal Substance Abuse:  No Significant Maternal Medications:  Meds include: Other: zyrtec Significant Maternal Lab Results: Lab values include: Group B Strep negative  ABO, Rh: --/--/O POS (09/14 1150) Antibody: NEG (09/14 1150) Rubella: Immune RPR: Non Reactive (09/14 1150)  HBsAg: Negative (03/09 0000)  HIV: Non-reactive (03/09 0000)  GBS:   neg    Vitals:   09/01/16 1134  BP: 121/89  Pulse: 72  Resp: 18  Temp: 98.1 F (36.7 C)     General:  NAD Abdomen:  soft, gravid Ex:  no edema FHTs:  present  Bedside ultrasound confirmed breech presentation CBG 82  A/P   30 y.o. G2P0010 3140w3d presents for primary cesarean section for breech presentation.  Discussed risks of cesarean section to include, but not limited to, infection, bleeding, damage to surrounding strutcures (including bowel, bladder, tubes, ovaries, nerves, vessels, baby), need for additional procedures, risk of blood clot, need for transfusion. Consent signed Ancef 2gm on call to OR  [place]

## 2016-09-02 DIAGNOSIS — O321XX0 Maternal care for breech presentation, not applicable or unspecified: Secondary | ICD-10-CM | POA: Diagnosis present

## 2016-09-02 LAB — CBC
HEMATOCRIT: 27.6 % — AB (ref 36.0–46.0)
HEMOGLOBIN: 9.2 g/dL — AB (ref 12.0–15.0)
MCH: 27.2 pg (ref 26.0–34.0)
MCHC: 33.3 g/dL (ref 30.0–36.0)
MCV: 81.7 fL (ref 78.0–100.0)
Platelets: 211 10*3/uL (ref 150–400)
RBC: 3.38 MIL/uL — ABNORMAL LOW (ref 3.87–5.11)
RDW: 15.6 % — AB (ref 11.5–15.5)
WBC: 8.9 10*3/uL (ref 4.0–10.5)

## 2016-09-02 LAB — GLUCOSE, CAPILLARY: GLUCOSE-CAPILLARY: 123 mg/dL — AB (ref 65–99)

## 2016-09-02 LAB — BIRTH TISSUE RECOVERY COLLECTION (PLACENTA DONATION)

## 2016-09-02 NOTE — Op Note (Signed)
Cesarean Section Procedure Note  Pre-operative Diagnosis: 1. Intrauterine pregnancy at 6565w3d  2. Breech presentation  Post-operative Diagnosis: same as above  PROCEDURE:  Procedure(s): CESAREAN SECTION (N/A)  SURGEON:  Surgeon(s) and Role:    * Marlow Baarsyanna Nijah Orlich, MD - Primary       Carrington ClampMichelle Horvath, MD - Assistant  ANESTHESIA:   spinal  EBL: 700 mL         Drains: Foley catheter         Specimens: None                Complications:  None; patient tolerated the procedure well.         Disposition: PACU - hemodynamically stable.  Findings:  Normal tubes and ovaries bilaterally.  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  Viable female infant, 3155g (6lb 15oz) Apgars 7, 9.    Procedure Details   After epidural anesthesia was found to adequate , the patient was placed in the dorsal supine position with a leftward tilt, draped and prepped in the usual sterile manner. A Pfannenstiel incision was made and carried down through the subcutaneous tissue to the fascia. The fascia was incised in the midline and the fascial incision was extended laterally with Mayo scissors. The superior aspect of the fascial incision was grasped with two Kocher clamp, tented up and the rectus muscles dissected off sharply. The rectus was then dissected off with blunt dissection and Mayo scissors inferiorly. The rectus muscles were separated in the midline. The abdominal peritoneum was identified, tented up, entered bluntly, and the incision was extended superiorly and inferiorly with good visualization of the bladder. The Alexis retractor was deployed. The vesicouterine peritoneum was identified, tented up, entered sharply, and the bladder flap was created digitally. Scalpel was then used to make a low transverse incision on the uterus which was extended in the cephalad-caudad direction with blunt dissection. The fluid was clear. The fetal breech was identified, elevated out of the pelvis and brought to the  hysterotomy. The usual breech maneuvers were employed, and the baby delivered in the frank breech position.  The head was easily delivered. The cord was clamped and cut after one minute delayed cord clamping and the infant was passed to the waiting neonatologist. Placenta was then delivered spontaneously, intact and appear normal, the uterus was cleared of all clot and debris.  A bicornuate uterus was previously identified on ultrasound.  Tubes and ovaries were normal.  The two separate horns were not clearly identified on exam.    The hysterotomy was repaired with #0 Monocryl in running locked fashion.  A second imbricating layer was placed with the same suture.  The hysterotomy was reexamined and excellent hemostasis was noted.  The Alexis retractor was removed from the abdomen. The peritoneum was examined and all vessels noted to be hemostatic. The abdominal cavity was cleared of all clot and debris.  The peritoneum was closed with 2-0 vicryl in a running fashion. The fascia and rectus muscles were inspected and were hemostatic. The fascia was closed with 0 Vicryl in a running fashion. The subcuticular layer was irrigated and all bleeders cauterized.  The subcutaneous layer was re approximated with interrupted 3-0 plain gut.  The skin was closed with 3-0 monocryl in a subcuticular fashion. The incision was dressed with benzoine, steri strips and pressure dressing. All sponge lap and needle counts were correct x3. Patient tolerated the procedure well and recovered in stable condition following the procedure.

## 2016-09-02 NOTE — Progress Notes (Signed)
Patient is doing well.  She is tolerating PO, ambulating, voiding.  Pain is controlled.  Lochia is appropriate  Vitals:   09/01/16 1835 09/01/16 2215 09/02/16 0230 09/02/16 0636  BP: (!) 99/57  (!) 97/54 (!) 93/58  Pulse: (!) 56  79 78  Resp:  18 18 18   Temp: 98.7 F (37.1 C) 98.8 F (37.1 C) 98.9 F (37.2 C) 97.7 F (36.5 C)  TempSrc:  Oral Oral Oral  SpO2:  99% 96% 98%  Weight:        NAD Abdomen:  soft, appropriate tenderness, incisions intact and without erythema or drainage ext:    Symmetric, trace edema bilaterally  Lab Results  Component Value Date   WBC 8.9 09/02/2016   HGB 9.2 (L) 09/02/2016   HCT 27.6 (L) 09/02/2016   MCV 81.7 09/02/2016   PLT 211 09/02/2016    --/--/O POS (09/14 1150)/RImmune  A/P    29 y.o. G2P1011 POD 1 s/p primary c/s for breech presentation Routine post op and postpartum care.

## 2016-09-02 NOTE — Progress Notes (Signed)
CSw met with pt to discuss her hx of anxiety/depression.  Pt confirms history.  Per pt, she was on meds prior to pregnancy, but is not sure if she will continue with meds now that she has delivered.  Per pt, she has a [employer] therapist who she began seeing during pregnancy, and  plans on f/u with therapy at d/c. Pt familiar with signs/symptoms of PPD through discussions with her therapist and is agreeable to f/u with therapist/MD for any changes in mood/behavior, post d/c.  No other social work needs identified.  CSW signing off.  Please re-consult, as necessary.  Creta Levin, LCSW Weekend Coverage [phone]

## 2016-09-02 NOTE — Discharge Instructions (Signed)

## 2016-09-02 NOTE — Lactation Note (Signed)
This note was copied from a baby's chart. Lactation Consultation Note  Patient Name: Sandra Bauer ZOXWR'UToday's Date: 09/02/2016 Reason for consult: Follow-up assessment   Follow up with mom of 30 hour old infant. Infant with 11 BF for 10-30 minutes, 3 voids and 6 stools in last 24 hours preceding this assessment. Infant weight 6 lb 11.9 oz with 3 % weight loss since birth. LATCH scores 8 by bedside RN.   Infant currently in a deep sleep in Sandra Bauer arms.   Mom reports she feels BF is going well. Mom reports infant will get sleepy at breast and when taken of will cue to feed. Discussed awakening techniques to keep infant awake while at breast. Sandra Bauer was holding infant and had a pacifier in her hand, discussed awaiting pacifier for first few weeks of life as to not mask feeding cues. Parents report infant has been sucking on her hands a lot, discussed that this is a feeding cue. Discussed cluster feeding with parents.   Mom reports her breasts are feeling fuller, she reports she does not have a lot of milk yet, discussed progression of milk coming to volume. Enc mom to feed infant at first feeding cues. Mom reports nipple tenderness with initial latch, enc mom to hand express colostrum and rub on nipples post BF and allow to air dry.   Enc parents to call out to desk for feeding assistance as needed. They have no questions at this time.      Maternal Data Does the patient have breastfeeding experience prior to this delivery?: No  Feeding Feeding Type: Breast Fed Length of feed: 15 min  LATCH Score/Interventions Latch: Grasps breast easily, tongue down, lips flanged, rhythmical sucking.  Audible Swallowing: A few with stimulation  Type of Nipple: Everted at rest and after stimulation  Comfort (Breast/Nipple): Soft / non-tender     Hold (Positioning): Assistance needed to correctly position infant at breast and maintain latch.  LATCH Score: 8  Lactation Tools Discussed/Used      Consult Status Consult Status: Follow-up Date: 09/03/16 Follow-up type: In-patient    Sandra Bauer 09/02/2016, 7:14 PM

## 2016-09-03 MED ORDER — POLYETHYLENE GLYCOL 3350 17 G PO PACK
17.0000 g | PACK | Freq: Every day | ORAL | Status: DC | PRN
  Filled 2016-09-03: qty 1

## 2016-09-03 NOTE — Progress Notes (Signed)
Patient is doing well.  She is tolerating PO, ambulating, voiding.  Pain is controlled.  Lochia is appropriate  Vitals:   09/02/16 0636 09/02/16 1143 09/02/16 1830 09/03/16 0500  BP: (!) 93/58 121/72 114/69 116/82  Pulse: 78 64 82 98  Resp: 18  18 18   Temp: 97.7 F (36.5 C) 98.3 F (36.8 C) 98.2 F (36.8 C) 98.5 F (36.9 C)  TempSrc: Oral  Oral Oral  SpO2: 98%  100%   Weight:        NAD Abdomen:  soft, appropriate tenderness, incisions intact and without erythema or drainage ext:    Symmetric, 1+edema bilaterally  Lab Results  Component Value Date   WBC 8.9 09/02/2016   HGB 9.2 (L) 09/02/2016   HCT 27.6 (L) 09/02/2016   MCV 81.7 09/02/2016   PLT 211 09/02/2016    --/--/O POS (09/14 1150)/RImmune  A/P    30 y.o. G2P1011 POD #2 s/p primary c/s for breech presentation Routine post op and postpartum care.   Baby is under bili lights--mom desires to stay until tomorrow

## 2016-09-03 NOTE — Progress Notes (Signed)
During assessment RN noted that pt had removed honeycomb dressing and only steri-strips were in place over incision.  Pt stated her doctor told her she could take it off after her shower today.  Incision clean and dry, no drainage noted.

## 2016-09-04 ENCOUNTER — Encounter (HOSPITAL_COMMUNITY): Payer: Self-pay | Admitting: Obstetrics

## 2016-09-04 MED ORDER — IBUPROFEN 600 MG PO TABS: 600.0000 mg | ORAL_TABLET | Freq: Four times a day (QID) | ORAL | 0 refills | Status: DC | PRN

## 2016-09-04 MED ORDER — OXYCODONE-ACETAMINOPHEN 5-325 MG PO TABS: 1.0000 | ORAL_TABLET | ORAL | 0 refills | Status: DC | PRN

## 2016-09-04 MED ORDER — DOCUSATE SODIUM 100 MG PO CAPS: 100.0000 mg | ORAL_CAPSULE | Freq: Two times a day (BID) | ORAL | 0 refills | Status: DC

## 2016-09-04 NOTE — Discharge Summary (Signed)
Obstetric Discharge Summary Reason for Admission: cesarean section Prenatal Procedures: ultrasound Intrapartum Procedures: cesarean: low cervical, transverse Postpartum Procedures: none Complications-Operative and Postpartum: none Hemoglobin  Date Value Ref Range Status  09/02/2016 9.2 (L) 12.0 - 15.0 g/dL Final    Comment:    REPEATED TO VERIFY DELTA CHECK NOTED    HCT  Date Value Ref Range Status  09/02/2016 27.6 (L) 36.0 - 46.0 % Final    Physical Exam:  General: alert, cooperative and appears stated age 58Lochia: appropriate Uterine Fundus: firm Incision: healing well DVT Evaluation: No evidence of DVT seen on physical exam.  Discharge Diagnoses: Term Pregnancy-delivered  Discharge Information: Date: 09/04/2016 Activity: pelvic rest Diet: routine Medications: Ibuprofen, Colace and Percocet Condition: improved Instructions: refer to practice specific booklet Discharge to: home Follow-up Information    [facility] Follow up in 4 week(s).   Specialty:  Obstetrics Contact information: [address] [place] [address] [phone]           Newborn Data: Live born female  Birth Weight: 6 lb 15.3 oz (3155 g) APGAR: 7, 9  Home with mother.  Atley Scarboro H. 09/04/2016, 8:17 AM

## 2016-09-04 NOTE — Lactation Note (Addendum)
This note was copied from a baby's chart. Lactation Consultation Note  Since baby was recently on phototherapy reviewed waking techniques. Mother's breasts are filling. Baby latched in football hold upon entering with the assistance of Maury DusDonna RN. Intermittent sucks and swallows observed. Provided mother w/ hand pump to prepump if needed to stimulate milk flow. Mom encouraged to feed baby 8-12 times/24 hours and with feeding cues on both breasts. Discussed pumping strategies for when mother goes back to work at 8 weeks. Reviewed engorgement care and monitoring voids/stools.  Patient Name: Sandra Bauer'UToday's Date: 09/04/2016 Reason for consult: Follow-up assessment   Maternal Data    Feeding Feeding Type: Breast Fed  LATCH Score/Interventions Latch: Repeated attempts needed to sustain latch, nipple held in mouth throughout feeding, stimulation needed to elicit sucking reflex. (latched upon entering)  Audible Swallowing: Spontaneous and intermittent Intervention(s): Hand expression;Alternate breast massage;Skin to skin  Type of Nipple: Everted at rest and after stimulation  Comfort (Breast/Nipple): Filling, red/small blisters or bruises, mild/mod discomfort  Problem noted: Mild/Moderate discomfort  Hold (Positioning): Assistance needed to correctly position infant at breast and maintain latch.  LATCH Score: 7  Lactation Tools Discussed/Used     Consult Status Consult Status: Complete    Hardie PulleyBerkelhammer, Calaya Gildner Boschen 09/04/2016, 10:16 AM

## 2016-09-30 ENCOUNTER — Encounter: Payer: Self-pay | Admitting: Family Medicine

## 2016-10-05 ENCOUNTER — Ambulatory Visit (INDEPENDENT_AMBULATORY_CARE_PROVIDER_SITE_OTHER): Payer: Managed Care, Other (non HMO) | Admitting: Family Medicine

## 2016-10-05 ENCOUNTER — Encounter: Payer: Self-pay | Admitting: Family Medicine

## 2016-10-05 VITALS — BP 142/92 | HR 86 | Temp 98.2°F | Resp 16 | Wt 165.8 lb

## 2016-10-05 DIAGNOSIS — F53 Postpartum depression: Secondary | ICD-10-CM

## 2016-10-05 DIAGNOSIS — F411 Generalized anxiety disorder: Secondary | ICD-10-CM

## 2016-10-05 DIAGNOSIS — O99345 Other mental disorders complicating the puerperium: Principal | ICD-10-CM

## 2016-10-05 MED ORDER — SERTRALINE HCL 50 MG PO TABS: 50.0000 mg | ORAL_TABLET | Freq: Every day | ORAL | 1 refills | Status: DC

## 2016-10-05 NOTE — Progress Notes (Signed)
OFFICE VISIT  10/05/2016   CC:  Chief Complaint  Patient presents with  . Follow-up    anxiety and depression    HPI:    Patient is a 30 y.o. Hispanic female who presents for f/u anxiety and depression.  She has had a newborn baby since I last saw her.  She is breastfeeding.  She saw a Veterinary surgeon when pregnant.  The psychiatrist I referred her to has cancelled on her 3 times, next avail appt end of November this year.  Counseling helped but hard to make time right now for this. She has been experiencing depression.  Crying spells.  No SI, HI.  She is able to care for her baby w/out any problem.   She does feel overwhelmed, though, but her mom is here with her now to help for a month.  Past Medical History:  Diagnosis Date  . Anxiety   . Bicornate uterus    Renal u/s 11/2015 to eval for renal abnormalities: NORMAL  . Depression   . GAD (generalized anxiety disorder)   . Gestational diabetes   . Gestational diabetes mellitus 07/2016   Plan is for C/S due to breech presentation + uterine anomaly.  As of 09/29/16 postpartum GYN visit, they plan on checking a 2h GTT.  . SAB (spontaneous abortion) 10/2015  . Seasonal allergic rhinitis     Past Surgical History:  Procedure Laterality Date  . CESAREAN SECTION N/A 09/01/2016   Procedure: CESAREAN SECTION;  Surgeon: Marlow Baars, MD;  Location: [facility] SU[facility];  Service: Obstetrics;  Laterality: N/A;  . DILATION AND EVACUATION  10/26/15   for missed abortion  . DILATION AND EVACUATION N/A 10/27/2015   Procedure: DILATATION AND EVACUATION ULTRASOUND;  Surgeon: Marlow Baars, MD;  Location: [facility] ORS;  Service: Gynecology;  Laterality: N/A;  Ultrasound called on 10/26/15 by Donne Hazel RN  . WISDOM TOOTH EXTRACTION  2 YEARS AGO    Outpatient Medications Prior to Visit  Medication Sig Dispense Refill  . Docosahexaenoic Acid (PRENATAL DHA PO) Take 1 capsule by mouth daily.    Marland Kitchen docusate sodium (COLACE) 100 MG capsule Take 1 capsule  (100 mg total) by mouth 2 (two) times daily. 60 capsule 0  . ibuprofen (ADVIL,MOTRIN) 600 MG tablet Take 1 tablet (600 mg total) by mouth every 6 (six) hours as needed. 90 tablet 0  . oxyCODONE-acetaminophen (ROXICET) 5-325 MG tablet Take 1-2 tablets by mouth every 4 (four) hours as needed for severe pain. (Patient not taking: Reported on 10/05/2016) 30 tablet 0   No facility-administered medications prior to visit.     Allergies  Allergen Reactions  . Nuvaring [Etonogestrel-Ethinyl Estradiol]     Vaginal burning, HA, bloating, mood change, nose bleed    ROS As per HPI  PE: Blood pressure (!) 142/92, pulse 86, temperature 98.2 F (36.8 C), temperature source Oral, resp. rate 16, weight 165 lb 12.8 oz (75.2 kg), SpO2 99 %, unknown if currently breastfeeding. Wt Readings from Last 2 Encounters:  10/05/16 165 lb 12.8 oz (75.2 kg)  09/01/16 181 lb (82.1 kg)    Gen: alert, oriented x 4, affect pleasant.  Lucid thinking and conversation noted. HEENT: PERRLA, EOMI.   Neck: no LAD, mass, or thyromegaly. CV: RRR, no m/r/g LUNGS: CTA bilat, nonlabored. NEURO: no tremor or tics noted on observation.  Coordination intact. CN 2-12 grossly intact bilaterally, strength 5/5 in all extremeties.  No ataxia.   LABS:  none  IMPRESSION AND PLAN:  Postpartum depression with anxiety.  She is a good candidate for sertraline 50mg  qd. Therapeutic expectations and side effect profile of medication discussed today.  Patient's questions answered. F/u 1 mo and likely titrate up to 100mg  qd. Signs/symptoms to call or return for were reviewed and pt expressed understanding.  An After Visit Summary was printed and given to the patient.  FOLLOW UP: Return in about 4 weeks (around 11/02/2016) for f/u anx/dep.  Signed:  Santiago BumpersPhil McGowen, MD           10/05/2016

## 2016-10-06 ENCOUNTER — Ambulatory Visit (HOSPITAL_COMMUNITY): Payer: Self-pay | Admitting: Psychiatry

## 2016-10-17 ENCOUNTER — Encounter: Payer: Self-pay | Admitting: Family Medicine

## 2016-10-17 ENCOUNTER — Ambulatory Visit (INDEPENDENT_AMBULATORY_CARE_PROVIDER_SITE_OTHER): Payer: Managed Care, Other (non HMO) | Admitting: Family Medicine

## 2016-10-17 VITALS — BP 128/86 | HR 92 | Temp 98.8°F | Resp 20 | Wt 164.0 lb

## 2016-10-17 DIAGNOSIS — O99345 Other mental disorders complicating the puerperium: Principal | ICD-10-CM

## 2016-10-17 DIAGNOSIS — F411 Generalized anxiety disorder: Secondary | ICD-10-CM | POA: Diagnosis not present

## 2016-10-17 DIAGNOSIS — F53 Postpartum depression: Secondary | ICD-10-CM | POA: Insufficient documentation

## 2016-10-17 MED ORDER — PAROXETINE HCL 20 MG PO TABS: 20.0000 mg | ORAL_TABLET | Freq: Every day | ORAL | 0 refills | Status: DC

## 2016-10-17 NOTE — Progress Notes (Signed)
Sandra Bauer , [birth year], 30 y.o., female MRN: [id number] Patient Care Team    Relationship Specialty Notifications Start End  Jeoffrey MassedPhilip H McGowen, MD PCP - General Family Medicine  01/27/11   Marlow Baarsyanna Clark, MD Consulting Physician Obstetrics  02/26/16   Waynard ReedsKendra Ross, MD Consulting Physician Obstetrics and Gynecology  05/17/16     CC: zoloft reaction Subjective: Pt presents for an acute OV with complaints of thoughts of harming herself, heart racing, diarrhea, crying and shaking after increasing her zoloft dose from 25 mg to 50 mg. She was started on Zoloft 25 mg 1 week ago, and increased her dose last night to the 50 mg tab. She states she was tolerating the 25 mg dose, but did not feel like she had great coverage. She recently gave birth in September to her first child, which was a healthy baby girl. She has had problems with anxiety prior to pregnancy and was prescribed Cymbalta 60 mg. She was referred to a psychiatrist after pregnancy, however her appt had been cancelled by the provider on multiple occasions. She did speak with a Veterinary surgeoncounselor. She currently does not have feelings of hurting herself and her others symptoms have also resolved, with the exception of diarrhea. She has a good support system with her husband and her mother, which is staying with them for a few weeks. She states she just wants to feel better, but she is scared to feel like she did. She is breastfeeding. She has never been diagnosed with bipolar disorder in the past.   Allergies  Allergen Reactions  . Nuvaring [Etonogestrel-Ethinyl Estradiol]     Vaginal burning, HA, bloating, mood change, nose bleed   Social History  Substance Use Topics  . Smoking status: Never Smoker  . Smokeless tobacco: Never Used  . Alcohol use No   Past Medical History:  Diagnosis Date  . Anxiety   . Bicornate uterus    Renal u/s 11/2015 to eval for renal abnormalities: NORMAL  . Depression   . GAD (generalized anxiety disorder)   .  Gestational diabetes   . Gestational diabetes mellitus 07/2016   Plan is for C/S due to breech presentation + uterine anomaly.  As of 09/29/16 postpartum GYN visit, they plan on checking a 2h GTT.  . SAB (spontaneous abortion) 10/2015  . Seasonal allergic rhinitis    Past Surgical History:  Procedure Laterality Date  . CESAREAN SECTION N/A 09/01/2016   Procedure: CESAREAN SECTION;  Surgeon: Marlow Baarsyanna Clark, MD;  [facility];  Service: Obstetrics;  Laterality: N/A;  . DILATION AND EVACUATION  10/26/15   for missed abortion  . DILATION AND EVACUATION N/A 10/27/2015   Procedure: DILATATION AND EVACUATION ULTRASOUND;  Surgeon: Marlow Baarsyanna Clark, MD;  Location: [facility];  Service: Gynecology;  Laterality: N/A;  Ultrasound called on 10/26/15 by Donne Hazelhassity Stewart RN  . WISDOM TOOTH EXTRACTION  2 YEARS AGO   Family History  Problem Relation Age of Onset  . Cancer Maternal Grandfather     "stomach"  . Diabetes Paternal Grandfather   . Stroke Paternal Grandfather      Medication List       Accurate as of 10/17/16  9:00 AM. Always use your most recent med list.          ibuprofen 600 MG tablet Commonly known as:  ADVIL,MOTRIN Take 1 tablet (600 mg total) by mouth every 6 (six) hours as needed.   PRENATAL DHA PO Take 1 capsule by mouth daily.  sertraline 50 MG tablet Commonly known as:  ZOLOFT Take 1 tablet (50 mg total) by mouth daily.       No results found for this or any previous visit (from the past 24 hour(s)). No results found.   ROS: Negative, with the exception of above mentioned in HPI   Objective:  BP 128/86 (BP Location: Right Arm, Patient Position: Sitting, Cuff Size: Normal)   Pulse 92   Temp 98.8 F (37.1 C)   Resp 20   Wt 164 lb (74.4 kg)   SpO2 97%   Breastfeeding? Yes   BMI 30.99 kg/m  Body mass index is 30.99 kg/m. Gen: Afebrile. No acute distress. Nontoxic in appearance, well developed, well nourished. Tearful HENT: AT. [place].   MMM Eyes:Pupils Equal Round Reactive to light, Extraocular movements intact,  Conjunctiva without redness, discharge or icterus. CV: RRR Neuro: Normal gait. PERLA. EOMi. Alert. Oriented x3  Psych: tearful. Sad. Anxious. Normal  dress and demeanor. Normal speech. Normal thought content and judgment. No SI or HI.   Assessment/Plan: Sandra Bauer is a 30 y.o. female present for acute OV for reaction to zoloft.  Generalized anxiety disorder/Post partum depression - Currently not with suicidal ideations or thoughts of harming herself/others. She has a great support system and her husband is with her today. Her mother is also staying with them for a few weeks.  - Discussed in great detail the multiple options she has to help her. Reassured patient she is not alone and many women suffer from PPD. I commended her for seeking treatment and knowing herself well enough to know, when she was having thoughts that were not of her own.  - Pt and husband informed similar reaction can occur with any SSRI/SNRI, but does not mean it will happen to all if it occurs once. She has tolerated cymbalta and has not been diagnosed with bipolar d/o in the past. Hopefully a different agent will not cause side effects.  - Since she is breastfeeding some options are limited, but discussed the other medications that are ok vs no medication treatment vs discontinuing breast feeding if needed. Could consider pump/freeze milk or formula in the future, if desiring Cymbalta restart.  - Pt decided on trying the paxil. Discussed side effect potential and emergent symptoms. Discussed 24 hour resources available for any reason concerning this condition and if SI/HI occurs. [facility] also has some [facility] counseling as well. Contact information was provided to her on these resources. [facility] discussed with her and her husband today.  - Pt has f/u in 2 weeks with her PCP.    > 25 minutes  spent with patient, >50% of time spent face to face counseling patient  electronically signed by:  Felix Pacinienee Kuneff, DO  [facility] [place]

## 2016-10-17 NOTE — Patient Instructions (Signed)
Postpartum Depression and Baby Blues The postpartum period begins right after the birth of a baby. During this time, there is often a great amount of joy and excitement. It is also a time of many changes in the life of the parents. Regardless of how many times a mother gives birth, each child brings new challenges and dynamics to the family. It is not unusual to have feelings of excitement along with confusing shifts in moods, emotions, and thoughts. All mothers are at risk of developing postpartum depression or the "baby blues." These mood changes can occur right after giving birth, or they may occur many months after giving birth. The baby blues or postpartum depression can be mild or severe. Additionally, postpartum depression can go away rather quickly, or it can be a long-term condition.  CAUSES Raised hormone levels and the rapid drop in those levels are thought to be a main cause of postpartum depression and the baby blues. A number of hormones change during and after pregnancy. Estrogen and progesterone usually decrease right after the delivery of your baby. The levels of thyroid hormone and various cortisol steroids also rapidly drop. Other factors that play a role in these mood changes include major life events and genetics.  RISK FACTORS If you have any of the following risks for the baby blues or postpartum depression, know what symptoms to watch out for during the postpartum period. Risk factors that may increase the likelihood of getting the baby blues or postpartum depression include:  Having a personal or family history of depression.   Having depression while being pregnant.   Having premenstrual mood issues or mood issues related to oral contraceptives.  Having a lot of life stress.   Having marital conflict.   Lacking a social support network.   Having a baby with special needs.   Having health problems, such as diabetes.  SIGNS AND SYMPTOMS Symptoms of baby blues  include:  Brief changes in mood, such as going from extreme happiness to sadness.  Decreased concentration.   Difficulty sleeping.   Crying spells, tearfulness.   Irritability.   Anxiety.  Symptoms of postpartum depression typically begin within the first month after giving birth. These symptoms include:  Difficulty sleeping or excessive sleepiness.   Marked weight loss.   Agitation.   Feelings of worthlessness.   Lack of interest in activity or food.  Postpartum psychosis is a very serious condition and can be dangerous. Fortunately, it is rare. Displaying any of the following symptoms is cause for immediate medical attention. Symptoms of postpartum psychosis include:   Hallucinations and delusions.   Bizarre or disorganized behavior.   Confusion or disorientation.  DIAGNOSIS  A diagnosis is made by an evaluation of your symptoms. There are no medical or lab tests that lead to a diagnosis, but there are various questionnaires that a health care provider may use to identify those with the baby blues, postpartum depression, or psychosis. Often, a screening tool called the Edinburgh Postnatal Depression Scale is used to diagnose depression in the postpartum period.  TREATMENT The baby blues usually goes away on its own in 1-2 weeks. Social support is often all that is needed. You will be encouraged to get adequate sleep and rest. Occasionally, you may be given medicines to help you sleep.  Postpartum depression requires treatment because it can last several months or longer if it is not treated. Treatment may include individual or group therapy, medicine, or both to address any social, physiological, and psychological   factors that may play a role in the depression. Regular exercise, a healthy diet, rest, and social support may also be strongly recommended.  Postpartum psychosis is more serious and needs treatment right away. Hospitalization is often needed. HOME CARE  INSTRUCTIONS  Get as much rest as you can. Nap when the baby sleeps.   Exercise regularly. Some women find yoga and walking to be beneficial.   Eat a balanced and nourishing diet.   Do little things that you enjoy. Have a cup of tea, take a bubble bath, read your favorite magazine, or listen to your favorite music.  Avoid alcohol.   Ask for help with household chores, cooking, grocery shopping, or running errands as needed. Do not try to do everything.   Talk to people close to you about how you are feeling. Get support from your partner, family members, friends, or other new moms.  Try to stay positive in how you think. Think about the things you are grateful for.   Do not spend a lot of time alone.   Only take over-the-counter or prescription medicine as directed by your health care provider.  Keep all your postpartum appointments.   Let your health care provider know if you have any concerns.  SEEK MEDICAL CARE IF: You are having a reaction to or problems with your medicine. SEEK IMMEDIATE MEDICAL CARE IF:  You have suicidal feelings.   You think you may harm the baby or someone else. MAKE SURE YOU:  Understand these instructions.  Will watch your condition.  Will get help right away if you are not doing well or get worse.   This information is not intended to replace advice given to you by your health care provider. Make sure you discuss any questions you have with your health care provider.   Document Released: 09/07/2004 Document Revised: 12/09/2013 Document Reviewed: 09/15/2013 Elsevier Interactive Patient Education 2016 [employer]  Start Paxil 20 mg this evening.  [facility]: [phone]  This is a 24 hour line as well.  If emergent needs arise, please be seen in [facility] ED and/or call a 24 hour help line.

## 2016-10-26 NOTE — Progress Notes (Deleted)
Psychiatric Initial Adult Assessment   Patient Identification: Sandra Bauer MRN:  [id number] Date of Evaluation:  10/26/2016 Referral Source: *** Chief Complaint:   Visit Diagnosis: No diagnosis found.  History of Present Illness:   Sandra Bauer is a [age] year old  post partum female with depression, anxiety,  Associated Signs/Symptoms: Depression Symptoms:  {DEPRESSION SYMPTOMS:20000} (Hypo) Manic Symptoms:  {BHH MANIC SYMPTOMS:22872} Anxiety Symptoms:  {BHH ANXIETY SYMPTOMS:22873} Psychotic Symptoms:  {BHH PSYCHOTIC SYMPTOMS:22874} PTSD Symptoms: {BHH PTSD SYMPTOMS:22875}  Past Psychiatric History: ***  Previous Psychotropic Medications: {YES/NO:21197}  Substance Abuse History in the last 12 months:  {yes no:314532}  Consequences of Substance Abuse: {BHH CONSEQUENCES OF SUBSTANCE ABUSE:22880}  Past Medical History:  Past Medical History:  Diagnosis Date  . Anxiety   . Bicornate uterus    Renal u/s 11/2015 to eval for renal abnormalities: NORMAL  . Depression   . GAD (generalized anxiety disorder)   . Gestational diabetes   . Gestational diabetes mellitus 07/2016   Plan is for C/S due to breech presentation + uterine anomaly.  As of 09/29/16 postpartum GYN visit, they plan on checking a 2h GTT.  . SAB (spontaneous abortion) 10/2015  . Seasonal allergic rhinitis     Past Surgical History:  Procedure Laterality Date  . CESAREAN SECTION N/A 09/01/2016   Procedure: CESAREAN SECTION;  Surgeon: Marlow Baarsyanna Clark, MD;  [facility];  Service: Obstetrics;  Laterality: N/A;  . DILATION AND EVACUATION  10/26/15   for missed abortion  . DILATION AND EVACUATION N/A 10/27/2015   Procedure: DILATATION AND EVACUATION ULTRASOUND;  Surgeon: Marlow Baarsyanna Clark, MD;  Location: [facility];  Service: Gynecology;  Laterality: N/A;  Ultrasound called on 10/26/15 by Donne Hazelhassity Stewart RN  . WISDOM TOOTH EXTRACTION  2 YEARS AGO    Family Psychiatric History: ***  Family History:  Family  History  Problem Relation Age of Onset  . Cancer Maternal Grandfather     "stomach"  . Diabetes Paternal Grandfather   . Stroke Paternal Grandfather     Social History:   Social History   Social History  . Marital status: Married    Spouse name: N/A  . Number of children: N/A  . Years of education: N/A   Social History Main Topics  . Smoking status: Never Smoker  . Smokeless tobacco: Never Used  . Alcohol use No  . Drug use: No  . Sexual activity: Yes    Birth control/ protection: None   Other Topics Concern  . Not on file   Social History Narrative   Married 2015.   Orig from [place], lives in [place], [place].   Has three younger sisters in [place].   Attending nursing school, works full time at [employer] in [place].   No T/A/Ds.    Additional Social History: ***  Allergies:   Allergies  Allergen Reactions  . Nuvaring [Etonogestrel-Ethinyl Estradiol]     Vaginal burning, HA, bloating, mood change, nose bleed    Metabolic Disorder Labs: No results found for: HGBA1C, MPG No results found for: PROLACTIN Lab Results  Component Value Date   CHOL 170 02/20/2012   TRIG 82.0 02/20/2012   HDL 37.20 (L) 02/20/2012   CHOLHDL 5 02/20/2012   VLDL 16.4 02/20/2012   LDLCALC 116 (H) 02/20/2012     Current Medications: Current Outpatient Prescriptions  Medication Sig Dispense Refill  . Docosahexaenoic Acid (PRENATAL DHA PO) Take 1 capsule by mouth daily.    Marland Kitchen. ibuprofen (ADVIL,MOTRIN) 600 MG tablet Take 1  tablet (600 mg total) by mouth every 6 (six) hours as needed. 90 tablet 0  . PARoxetine (PAXIL) 20 MG tablet Take 1 tablet (20 mg total) by mouth daily. 30 tablet 0   No current facility-administered medications for this visit.     Neurologic: Headache: {BHH YES OR NO:22294} Seizure: {BHH YES OR NO:22294} Paresthesias:{BHH YES OR WJ:19147}O:22294}  Musculoskeletal: Strength & Muscle Tone: {desc; muscle tone:32375} Gait & Station: {PE GAIT ED WGNF:62130}ATL:22525} Patient leans:  {Patient Leans:21022755}  Psychiatric Specialty Exam: ROS  currently breastfeeding.There is no height or weight on file to calculate BMI.  General Appearance: {Appearance:22683}  Eye Contact:  {BHH EYE CONTACT:22684}  Speech:  {Speech:22685}  Volume:  {Volume (PAA):22686}  Mood:  {BHH MOOD:22306}  Affect:  {Affect (PAA):22687}  Thought Process:  {Thought Process (PAA):22688}  Orientation:  {BHH ORIENTATION (PAA):22689}  Thought Content:  {Thought Content:22690}  Suicidal Thoughts:  {ST/HT (PAA):22692}  Homicidal Thoughts:  {ST/HT (PAA):22692}  Memory:  {BHH MEMORY:22881}  Judgement:  {Judgement (PAA):22694}  Insight:  {Insight (PAA):22695}  Psychomotor Activity:  {Psychomotor (PAA):22696}  Concentration:  {Concentration:21399}  Recall:  {BHH GOOD/FAIR/POOR:22877}  Fund of Knowledge:{BHH GOOD/FAIR/POOR:22877}  Language: {BHH GOOD/FAIR/POOR:22877}  Akathisia:  {BHH YES OR NO:22294}  Handed:  {Handed:22697}  AIMS (if indicated):  ***  Assets:  {Assets (PAA):22698}  ADL's:  {BHH QMV'H:84696}ADL'S:22290}  Cognition: {chl bhh cognition:304700322}  Sleep:  ***    Treatment Plan Summary: {CHL AMB BH MD TX EXBM:8413244010}[phone]}   Neysa Hottereina Ife Vitelli, MD 11/9/20173:01 PM

## 2016-10-30 ENCOUNTER — Ambulatory Visit (HOSPITAL_COMMUNITY): Payer: Self-pay | Admitting: Psychiatry

## 2016-11-03 ENCOUNTER — Ambulatory Visit: Payer: Managed Care, Other (non HMO) | Admitting: Family Medicine

## 2016-11-14 ENCOUNTER — Ambulatory Visit: Payer: Self-pay | Admitting: Family Medicine

## 2017-01-02 ENCOUNTER — Telehealth: Payer: Self-pay | Admitting: Family Medicine

## 2017-01-02 NOTE — Telephone Encounter (Signed)
Patient states she is returning call to Diane to provide fax number to fax FMLA form to.  Please fax forms to [phone], Attn: Payroll - Anselm LisMaria Kerby.  Please notify patient when form has been faxed.

## 2017-01-17 ENCOUNTER — Ambulatory Visit: Payer: Self-pay | Admitting: Family Medicine

## 2017-02-26 ENCOUNTER — Ambulatory Visit (INDEPENDENT_AMBULATORY_CARE_PROVIDER_SITE_OTHER): Payer: Managed Care, Other (non HMO) | Admitting: Family Medicine

## 2017-02-26 ENCOUNTER — Encounter: Payer: Self-pay | Admitting: Family Medicine

## 2017-02-26 ENCOUNTER — Telehealth: Payer: Self-pay | Admitting: *Deleted

## 2017-02-26 VITALS — BP 129/86 | HR 96 | Temp 99.3°F | Resp 16 | Ht 61.0 in | Wt 175.5 lb

## 2017-02-26 DIAGNOSIS — H65113 Acute and subacute allergic otitis media (mucoid) (sanguinous) (serous), bilateral: Secondary | ICD-10-CM

## 2017-02-26 DIAGNOSIS — H60313 Diffuse otitis externa, bilateral: Secondary | ICD-10-CM

## 2017-02-26 MED ORDER — AMOXICILLIN 875 MG PO TABS: 875.0000 mg | ORAL_TABLET | Freq: Two times a day (BID) | ORAL | 0 refills | Status: AC

## 2017-02-26 MED ORDER — NEOMYCIN-POLYMYXIN-HC 3.5-10000-1 OT SOLN: 4.0000 [drp] | Freq: Four times a day (QID) | OTIC | 0 refills | Status: DC

## 2017-02-26 NOTE — Progress Notes (Signed)
OFFICE VISIT  02/26/2017   CC:  Chief Complaint  Patient presents with  . Ear Pain    left ear x 3 days with itching, drainage, fever of 103F saturday   HPI:    Patient is a 31 y.o.  female who presents for ear complaint. Had onset of left ear pain 3 d/a, fever to 103 the day it started but none yesterday or today. Has been taking 600mg  q6h since onset.  No nasal congestion/runny nose, or cough.  Started with slight cough this morning.  Right ear starting to bother her today, milder.  Left one drained fluid since onset.   Past Medical History:  Diagnosis Date  . Anxiety   . Bicornate uterus    Renal u/s 11/2015 to eval for renal abnormalities: NORMAL  . Depression   . GAD (generalized anxiety disorder)   . Gestational diabetes   . Gestational diabetes mellitus 07/2016   Plan is for C/S due to breech presentation + uterine anomaly.  As of 09/29/16 postpartum GYN visit, they plan on checking a 2h GTT.  . SAB (spontaneous abortion) 10/2015  . Seasonal allergic rhinitis     Past Surgical History:  Procedure Laterality Date  . CESAREAN SECTION N/A 09/01/2016   Procedure: CESAREAN SECTION;  Surgeon: Marlow Baarsyanna Clark, MD;  [facility];  Service: Obstetrics;  Laterality: N/A;  . DILATION AND EVACUATION  10/26/15   for missed abortion  . DILATION AND EVACUATION N/A 10/27/2015   Procedure: DILATATION AND EVACUATION ULTRASOUND;  Surgeon: Marlow Baarsyanna Clark, MD;  Location: [facility];  Service: Gynecology;  Laterality: N/A;  Ultrasound called on 10/26/15 by Donne Hazelhassity Stewart RN  . WISDOM TOOTH EXTRACTION  2 YEARS AGO    Outpatient Medications Prior to Visit  Medication Sig Dispense Refill  . Docosahexaenoic Acid (PRENATAL DHA PO) Take 1 capsule by mouth daily.    Marland Kitchen. ibuprofen (ADVIL,MOTRIN) 600 MG tablet Take 1 tablet (600 mg total) by mouth every 6 (six) hours as needed. 90 tablet 0  . PARoxetine (PAXIL) 20 MG tablet Take 1 tablet (20 mg total) by mouth daily. (Patient not taking:  Reported on 02/26/2017) 30 tablet 0   No facility-administered medications prior to visit.     Allergies  Allergen Reactions  . Nuvaring [Etonogestrel-Ethinyl Estradiol]     Vaginal burning, HA, bloating, mood change, nose bleed    ROS As per HPI  PE: Blood pressure 129/86, pulse 96, temperature 99.3 F (37.4 C), temperature source Oral, resp. rate 16, height 5\' 1"  (1.549 m), weight 175 lb 8 oz (79.6 kg), SpO2 99 %, currently breastfeeding. Gen: Alert, well appearing.  Patient is oriented to person, place, time, and situation. Nose: clear. EARS: both EACs with white drainage, L side with sensitivity to manipulation and with insertion of spectrum into EAC.   Both TMs with white fluid behind TMs, but TMs appear intact. She has mild sub auricular LAD that is mildly tender on L. Oropharynx: no swelling or exudate.  No tonsillar asymmetry.  LABS:  none  IMPRESSION AND PLAN:  Bilat AOM and OE. Amoxil 875mg  bid x 10d. Polymyxin otic 4 gtts each ear qid.  An After Visit Summary was printed and given to the patient.  FOLLOW UP: Return if symptoms worsen or fail to improve.  Signed:  Santiago BumpersPhil Olliver Boyadjian, MD           02/26/2017

## 2017-02-26 NOTE — Progress Notes (Signed)
Pre visit review using our clinic review tool, if applicable. No additional management support is needed unless otherwise documented below in the visit note. 

## 2017-02-26 NOTE — Telephone Encounter (Signed)
PLEASE NOTE: All timestamps contained within this report are represented as [place] Standard Time. CONFIDENTIALTY NOTICE: This fax transmission is intended only for the addressee. It contains information that is legally privileged, confidential or otherwise protected from use or disclosure. If you are not the intended recipient, you are strictly prohibited from reviewing, disclosing, copying using or disseminating any of this information or taking any action in reliance on or regarding this information. If you have received this fax in error, please notify us immediately by telephone so that we can arrange for its return to us. Phone: [phone], Toll-Free: [phone], Fax: [phone] Page: 1 of 1 Call Id: [id number] [facility] - Client TELEPHONE ADVICE RECORD [facility] Patient Name: Sandra Bauer Gender: Female DOB: 08-08-1986 Age: [age] Y 2 M 13 D Return Phone Number: [phone] (Primary) City/State/Zip: [place] Client [facility] - Client Client Site [facility] Physician Santiago BumpersMcGowen, Phil - MD Who Is Calling Patient / Member / Family / Caregiver Call Type Triage / Clinical Relationship To Patient Self Return Phone Number [phone] (Primary) Chief Complaint Earache Reason for Call Symptomatic / Request for Health Information Initial Comment Caller states thinks she has an ear infection, been aching since Fri and it is draining Nurse Assessment Nurse: Shelva MajesticWest, RN, [place] Date/Time (Eastern Time): 02/25/2017 2:13:39 PM Confirm and document reason for call. If symptomatic, describe symptoms. ---caller states feels like has an ear infection, draining, has been going on since Friday morning. Feel full. Temperature 104.0 (oral) last night. No temperature today. Does the PT have any chronic conditions? (i.e. diabetes, asthma, etc.) ---No Is the patient pregnant or possibly pregnant? (Ask all  females between the ages of 4712-55) ---No Guidelines Guideline Title Affirmed Question Ear - Swimmer's Bony area of skull behind the ear is pink or swollen Disp. Time Lamount Cohen(Eastern Time) Disposition Final User 02/25/2017 2:20:09 PM Go to ED Now (or PCP triage) Yes Shelva MajesticWest, RN, [place] Referrals GO TO FACILITY UNDECIDED Care Advice Given Per Guideline * IF NO PCP TRIAGE: You need to be seen. [facility] within the next hour. Leave as soon as you can. GO TO ED NOW (OR PCP TRIAGE): CARE ADVICE given per Ear - Swimmer's (Adult) guideline. * Please bring a list of your current medicines when you go to see the doctor.

## 2017-02-26 NOTE — Telephone Encounter (Signed)
Apt made for today (02/26/17) at 9:45am.

## 2017-03-06 ENCOUNTER — Encounter: Payer: Self-pay | Admitting: Family Medicine

## 2017-03-06 ENCOUNTER — Ambulatory Visit (INDEPENDENT_AMBULATORY_CARE_PROVIDER_SITE_OTHER): Payer: Managed Care, Other (non HMO) | Admitting: Family Medicine

## 2017-03-06 VITALS — BP 122/85 | HR 111 | Temp 99.0°F | Resp 16 | Ht 61.0 in | Wt 169.2 lb

## 2017-03-06 DIAGNOSIS — E860 Dehydration: Secondary | ICD-10-CM

## 2017-03-06 DIAGNOSIS — A084 Viral intestinal infection, unspecified: Secondary | ICD-10-CM

## 2017-03-06 MED ORDER — PROMETHAZINE HCL 12.5 MG PO TABS: ORAL_TABLET | ORAL | 1 refills | Status: DC

## 2017-03-06 NOTE — Progress Notes (Signed)
OFFICE VISIT  03/06/2017   CC:  Chief Complaint  Patient presents with  . Nausea    with vomiting and diarrhea, started last night   HPI:    Patient is a 31 y.o. Hispanic female who presents for vomiting and diarrhea. She has a recent URI, has been taking abx for OM. Yesterday she had onset of nausea, has vomited one time, currently still feels nauseated. Also, has had intermittent crampy abd pain--diffuse--and many loose BMs (thirteen per pt report today).  Having a BM would alleviate the abd pain. She took imodium and this has helped decrease frequency of BMs.  No fevers.   Past Medical History:  Diagnosis Date  . Anxiety   . Bicornate uterus    Renal u/s 11/2015 to eval for renal abnormalities: NORMAL  . Depression   . GAD (generalized anxiety disorder)   . Gestational diabetes mellitus 07/2016   Plan is for C/S due to breech presentation + uterine anomaly.  As of 09/29/16 postpartum GYN visit, they plan on checking a 2h GTT.  . SAB (spontaneous abortion) 10/2015  . Seasonal allergic rhinitis     Past Surgical History:  Procedure Laterality Date  . CESAREAN SECTION N/A 09/01/2016   Procedure: CESAREAN SECTION;  Surgeon: Marlow Baars, MD;  [facility]: [facility];  Service: Obstetrics;  Laterality: N/A;  . DILATION AND EVACUATION  10/26/15   for missed abortion  . DILATION AND EVACUATION N/A 10/27/2015   Procedure: DILATATION AND EVACUATION ULTRASOUND;  Surgeon: Marlow Baars, MD;  Location: [facility];  Service: Gynecology;  Laterality: N/A;  Ultrasound called on 10/26/15 by Donne Hazel RN  . WISDOM TOOTH EXTRACTION  2 YEARS AGO    Outpatient Medications Prior to Visit  Medication Sig Dispense Refill  . amoxicillin (AMOXIL) 875 MG tablet Take 1 tablet (875 mg total) by mouth 2 (two) times daily. 20 tablet 0  . Docosahexaenoic Acid (PRENATAL DHA PO) Take 1 capsule by mouth daily.    Marland Kitchen ibuprofen (ADVIL,MOTRIN) 600 MG tablet Take 1 tablet (600 mg total) by mouth every  6 (six) hours as needed. 90 tablet 0  . neomycin-polymyxin-hydrocortisone (CORTISPORIN) otic solution Place 4 drops into both ears 4 (four) times daily. (Patient not taking: Reported on 03/06/2017) 10 mL 0   No facility-administered medications prior to visit.     Allergies  Allergen Reactions  . Nuvaring [Etonogestrel-Ethinyl Estradiol]     Vaginal burning, HA, bloating, mood change, nose bleed    ROS As per HPI  PE: Blood pressure 122/85, pulse (!) 111, temperature 99 F (37.2 C), temperature source Oral, resp. rate 16, height 5\' 1"  (1.549 m), weight 169 lb 4 oz (76.8 kg), last menstrual period 02/27/2017, SpO2 99 %, currently breastfeeding. Wt is down 6 lbs from last EMR entry 02/26/17 Gen: Alert, tired- appearing.  Patient is oriented to person, place, time, and situation. AFFECT: pleasant, lucid thought and speech. ENT: Ears: EACs clear, normal epithelium.  TMs with good light reflex and landmarks bilaterally.  Eyes: no injection, icteris, swelling, or exudate.  EOMI, PERRLA. Nose: no drainage or turbinate edema/swelling.  No injection or focal lesion.  Mouth: lips without lesion/swelling.  Oral mucosa pink and moist.  Dentition intact and without obvious caries or gingival swelling.  Oropharynx without erythema, exudate, or swelling.  Neck - No masses or thyromegaly or limitation in range of motion CV: Regular rhythm, mild tachycardia, no m/r/g.   LUNGS: CTA bilat, nonlabored resps, good aeration in all lung fields. ABd: soft,  mild diffuse TTP w/out guarding or rebound.  No distention.  BS present but hypoactive. No  Mass, bruit, or HSM. EXT: no clubbing, cyanosis, or edema.   LABS:    Chemistry      Component Value Date/Time   NA 136 08/31/2016 1150   K 3.7 08/31/2016 1150   CL 110 08/31/2016 1150   CO2 19 (L) 08/31/2016 1150   BUN 10 08/31/2016 1150   CREATININE 0.48 08/31/2016 1150      Component Value Date/Time   CALCIUM 8.9 08/31/2016 1150   ALKPHOS 68 02/20/2012  0941   AST 22 02/20/2012 0941   ALT 22 02/20/2012 0941   BILITOT 0.2 (L) 02/20/2012 0941      IMPRESSION AND PLAN:  Viral Gastroenteritis with mild dehydration. Discussed dx, symptomatic care. Phenergan 12.5mg , 1-2 q6h prn.  Therapeutic expectations and side effect profile of medication discussed today.  Patient's questions answered.  Continue otc imodium prn to slow down frequency of watery BMs. Rehydration strategy with clear liquids discussed, with further info about gradual advancement of diet. BRAT diet handout given.  An After Visit Summary was printed and given to the patient.  FOLLOW UP: Return if symptoms worsen or fail to improve.  Signed:  Santiago BumpersPhil McGowen, MD           03/06/2017

## 2017-03-06 NOTE — Patient Instructions (Signed)
Make a 50/50 mix of gatorade and water and sip on this frequently. Advance as tolerated. If no vomiting through this evening, start with bland diet ("BRAT") diet. Bland Diet A bland diet consists of foods that do not have a lot of fat or fiber. Foods without fat or fiber are easier for the body to digest. They are also less likely to irritate your mouth, throat, stomach, and other parts of your gastrointestinal tract. A bland diet is sometimes called a BRAT diet. What is my plan? Your health care provider or dietitian may recommend specific changes to your diet to prevent and treat your symptoms, such as:  Eating small meals often.  Cooking food until it is soft enough to chew easily.  Chewing your food well.  Drinking fluids slowly.  Not eating foods that are very spicy, sour, or fatty.  Not eating citrus fruits, such as oranges and grapefruit. What do I need to know about this diet?  Eat a variety of foods from the bland diet food list.  Do not follow a bland diet longer than you have to.  Ask your health care provider whether you should take vitamins. What foods can I eat? Grains   Hot cereals, such as cream of wheat. Bread, crackers, or tortillas made from refined white flour. Rice. Vegetables  Canned or cooked vegetables. Mashed or boiled potatoes. Fruits  Bananas. Applesauce. Other types of cooked or canned fruit with the skin and seeds removed, such as canned peaches or pears. Meats and Other Protein Sources  Scrambled eggs. Creamy peanut butter or other nut butters. Lean, well-cooked meats, such as chicken or fish. Tofu. Soups or broths. Dairy  Low-fat dairy products, such as milk, cottage cheese, or yogurt. Beverages  Water. Herbal tea. Apple juice. Sweets and Desserts  Pudding. Custard. Fruit gelatin. Ice cream. Fats and Oils  Mild salad dressings. Canola or olive oil. The items listed above may not be a complete list of allowed foods or beverages. Contact your  dietitian for more options.  What foods are not recommended? Foods and ingredients that are often not recommended include:  Spicy foods, such as hot sauce or salsa.  Fried foods.  Sour foods, such as pickled or fermented foods.  Raw vegetables or fruits, especially citrus or berries.  Caffeinated drinks.  Alcohol.  Strongly flavored seasonings or condiments. The items listed above may not be a complete list of foods and beverages that are not allowed. Contact your dietitian for more information.  This information is not intended to replace advice given to you by your health care provider. Make sure you discuss any questions you have with your health care provider. Document Released: 03/27/2016 Document Revised: 05/11/2016 Document Reviewed: 12/16/2014 Elsevier Interactive Patient Education  2017 [employer]

## 2017-03-06 NOTE — Progress Notes (Signed)
Pre visit review using our clinic review tool, if applicable. No additional management support is needed unless otherwise documented below in the visit note. 

## 2017-08-09 DIAGNOSIS — Z833 Family history of diabetes mellitus: Secondary | ICD-10-CM | POA: Insufficient documentation

## 2017-09-10 ENCOUNTER — Encounter (HOSPITAL_COMMUNITY): Payer: Self-pay | Admitting: *Deleted

## 2017-09-10 ENCOUNTER — Inpatient Hospital Stay (HOSPITAL_COMMUNITY)
Admission: AD | Admit: 2017-09-10 | Discharge: 2017-09-10 | Disposition: A | Discharge status: Home / Self Care | Payer: Managed Care, Other (non HMO) | Source: Ambulatory Visit | Attending: Obstetrics and Gynecology | Admitting: Obstetrics and Gynecology

## 2017-09-10 DIAGNOSIS — Z3A12 12 weeks gestation of pregnancy: Secondary | ICD-10-CM | POA: Insufficient documentation

## 2017-09-10 DIAGNOSIS — O3401 Maternal care for unspecified congenital malformation of uterus, first trimester: Secondary | ICD-10-CM | POA: Diagnosis not present

## 2017-09-10 DIAGNOSIS — O4691 Antepartum hemorrhage, unspecified, first trimester: Secondary | ICD-10-CM | POA: Insufficient documentation

## 2017-09-10 DIAGNOSIS — Q513 Bicornate uterus: Secondary | ICD-10-CM | POA: Diagnosis not present

## 2017-09-10 DIAGNOSIS — O209 Hemorrhage in early pregnancy, unspecified: Secondary | ICD-10-CM

## 2017-09-10 LAB — URINALYSIS, ROUTINE W REFLEX MICROSCOPIC
Bilirubin Urine: NEGATIVE
GLUCOSE, UA: NEGATIVE mg/dL
KETONES UR: 80 mg/dL — AB
LEUKOCYTES UA: NEGATIVE
Nitrite: NEGATIVE
PROTEIN: NEGATIVE mg/dL
Specific Gravity, Urine: 1.014 (ref 1.005–1.030)
pH: 6 (ref 5.0–8.0)

## 2017-09-10 NOTE — Discharge Instructions (Signed)
Vaginal Bleeding During Pregnancy, First Trimester °A small amount of bleeding (spotting) from the vagina is common in early pregnancy. Sometimes the bleeding is normal and is not a problem, and sometimes it is a sign of something serious. Be sure to tell your doctor about any bleeding from your vagina right away. °Follow these instructions at home: °· Watch your condition for any changes. °· Follow your doctor's instructions about how active you can be. °· If you are on bed rest: °? You may need to stay in bed and only get up to use the bathroom. °? You may be allowed to do some activities. °? If you need help, make plans for someone to help you. °· Write down: °? The number of pads you use each day. °? How often you change pads. °? How soaked (saturated) your pads are. °· Do not use tampons. °· Do not douche. °· Do not have sex or orgasms until your doctor says it is okay. °· If you pass any tissue from your vagina, save the tissue so you can show it to your doctor. °· Only take medicines as told by your doctor. °· Do not take aspirin because it can make you bleed. °· Keep all follow-up visits as told by your doctor. °Contact a doctor if: °· You bleed from your vagina. °· You have cramps. °· You have labor pains. °· You have a fever that does not go away after you take medicine. °Get help right away if: °· You have very bad cramps in your back or belly (abdomen). °· You pass large clots or tissue from your vagina. °· You bleed more. °· You feel light-headed or weak. °· You pass out (faint). °· You have chills. °· You are leaking fluid or have a gush of fluid from your vagina. °· You pass out while pooping (having a bowel movement). °This information is not intended to replace advice given to you by your health care provider. Make sure you discuss any questions you have with your health care provider. °Document Released: 04/20/2014 Document Revised: 05/11/2016 Document Reviewed: 08/11/2013 °Elsevier Interactive  Patient Education © 2018 Elsevier Inc. ° °

## 2017-09-10 NOTE — MAU Provider Note (Signed)
Chief Complaint: Vaginal Bleeding   First Provider Initiated Contact with Patient 09/10/17 1124        SUBJECTIVE HPI: Sandra Bauer is a 31 y.o. G3P1011 at [redacted]w[redacted]d by LMP who presents to maternity admissions reporting vaginal bleeding which started as spotting last night and was heavier this morning.  Also has pelvic cramping.  Has had a first trimester workup in office with cultures and ultrasound.  Has a bicornuate uterus.  . She denies vaginal itching/burning, urinary symptoms, h/a, dizziness, n/v, or fever/chills.    Vaginal Bleeding  The patient's primary symptoms include pelvic pain and vaginal bleeding. The patient's pertinent negatives include no genital itching, genital lesions or genital odor. This is a new problem. The current episode started yesterday. The problem has been unchanged. The pain is mild. The problem affects both sides. She is pregnant. Associated symptoms include abdominal pain and nausea. Pertinent negatives include no chills, constipation, diarrhea, fever, frequency or vomiting. The vaginal discharge was bloody. The vaginal bleeding is lighter than menses. She has not been passing clots. She has not been passing tissue. Nothing (Lifted toddler this morning and bleeding happened after that) aggravates the symptoms. She has tried nothing for the symptoms.    RN Note: Started having some spotting last night when she wiped. Started having more bleeding this morning and having some abd cramping.  Past Medical History:  Diagnosis Date  . Anxiety   . Bicornate uterus    Renal u/s 11/2015 to eval for renal abnormalities: NORMAL  . Depression   . GAD (generalized anxiety disorder)   . Gestational diabetes mellitus 07/2016   Plan is for C/S due to breech presentation + uterine anomaly.  As of 09/29/16 postpartum GYN visit, they plan on checking a 2h GTT.  . SAB (spontaneous abortion) 10/2015  . Seasonal allergic rhinitis    Past Surgical History:  Procedure Laterality  Date  . CESAREAN SECTION N/A 09/01/2016   Procedure: CESAREAN SECTION;  Surgeon: Marlow Baars, MD;  Location: [facility] BIRTHING SUITES;  Service: Obstetrics;  Laterality: N/A;  . DILATION AND EVACUATION  10/26/15   for missed abortion  . DILATION AND EVACUATION N/A 10/27/2015   Procedure: DILATATION AND EVACUATION ULTRASOUND;  Surgeon: Marlow Baars, MD;  Location: [facility] ORS;  Service: Gynecology;  Laterality: N/A;  Ultrasound called on 10/26/15 by Donne Hazel RN  . WISDOM TOOTH EXTRACTION  2 YEARS AGO   Social History   Social History  . Marital status: Married    Spouse name: N/A  . Number of children: N/A  . Years of education: N/A   Occupational History  . Not on file.   Social History Main Topics  . Smoking status: Never Smoker  . Smokeless tobacco: Never Used  . Alcohol use No  . Drug use: No  . Sexual activity: Yes    Birth control/ protection: None   Other Topics Concern  . Not on file   Social History Narrative   Married 2015.   Orig from [place], lives in [place], [place].   Has three younger sisters in [place].   Attending nursing school, works full time at [employer] in [place].   No T/A/Ds.   No current facility-administered medications on file prior to encounter.    Current Outpatient Prescriptions on File Prior to Encounter  Medication Sig Dispense Refill  . Docosahexaenoic Acid (PRENATAL DHA PO) Take 1 capsule by mouth daily.    Marland Kitchen ibuprofen (ADVIL,MOTRIN) 600 MG tablet Take 1 tablet (600 mg total) by  mouth every 6 (six) hours as needed. 90 tablet 0  . promethazine (PHENERGAN) 12.5 MG tablet 1-2 tabs po q6h prn nausea 30 tablet 1   Allergies  Allergen Reactions  . Nuvaring [Etonogestrel-Ethinyl Estradiol]     Vaginal burning, HA, bloating, mood change, nose bleed    I have reviewed patient's Past Medical Hx, Surgical Hx, Family Hx, Social Hx, medications and allergies.   ROS:  Review of Systems  Constitutional: Negative for chills and fever.  Gastrointestinal:  Positive for abdominal pain and nausea. Negative for constipation, diarrhea and vomiting.  Genitourinary: Positive for pelvic pain and vaginal bleeding. Negative for frequency.   Review of Systems  Other systems negative   Physical Exam  Physical Exam Patient Vitals for the past 24 hrs:  BP Temp Temp src Pulse Resp SpO2 Height Weight  09/10/17 1106 123/70 98.2 F (36.8 C) Oral 88 18 100 %  (1.549 m) 164 lb (74.4 kg)   Constitutional: Well-developed, well-nourished female in no acute distress.  Cardiovascular: normal rate Respiratory: normal effort GI: Abd soft, non-tender. Pos BS x 4 MS: Extremities nontender, no edema, normal ROM Neurologic: Alert and oriented x 4.  GU: Neg CVAT.  PELVIC EXAM: Cervix pink, visually closed, without lesion, scant red mucous discharge, vaginal walls and external genitalia normal                           Cervix is long and closed, posterior, firm  FHT 160 by bedside US.  12 week active fetus seen, normal appearing gestational sac with decidual sign  LAB RESULTS Results for orders placed or performed during the hospital encounter of 09/10/17 (from the past 24 hour(s))  Urinalysis, Routine w reflex microscopic     Status: Abnormal   Collection Time: 09/10/17 11:08 AM  Result Value Ref Range   Color, Urine YELLOW YELLOW   APPearance CLEAR CLEAR   Specific Gravity, Urine 1.014 1.005 - 1.030   pH 6.0 5.0 - 8.0   Glucose, UA NEGATIVE NEGATIVE mg/dL   Hgb urine dipstick MODERATE (A) NEGATIVE   Bilirubin Urine NEGATIVE NEGATIVE   Ketones, ur 80 (A) NEGATIVE mg/dL   Protein, ur NEGATIVE NEGATIVE mg/dL   Nitrite NEGATIVE NEGATIVE   Leukocytes, UA NEGATIVE NEGATIVE   RBC / HPF 0-5 0 - 5 RBC/hpf   WBC, UA 0-5 0 - 5 WBC/hpf   Bacteria, UA RARE (A) NONE SEEN   Squamous Epithelial / LPF 0-5 (A) NONE SEEN   Mucus PRESENT        IMAGING No results found.  MAU Management/MDM: Bedside US done since we were unable to hear FHTs on doppler Dr  Claiborne Billings consulted with presentation and exam findings. Discussed pelvic rest. Has appointment Wednesday for first trimester screen  ASSESSMENT Single intrauterine pregnancy at [redacted]w[redacted]d Bleeding in pregnancy, small amount  PLAN Discharge home Pelvic rest Recommend bleeding monitoring and followup in office  Pt stable at time of discharge. Encouraged to return here or to other Urgent Care/ED if she develops worsening of symptoms, increase in pain, fever, or other concerning symptoms.    Wynelle Bourgeois CNM, MSN Certified Nurse-Midwife 09/10/2017  11:24 AM

## 2017-09-10 NOTE — MAU Note (Signed)
Started having some spotting last night when she wiped. Started having more bleeding this morning and having some abd cramping.

## 2017-09-14 LAB — HEMOGLOBIN A1C: HEMOGLOBIN A1C: 5.3

## 2017-12-02 ENCOUNTER — Inpatient Hospital Stay (HOSPITAL_COMMUNITY)
Admission: AD | Admit: 2017-12-02 | Discharge: 2017-12-02 | Disposition: A | Discharge status: Home / Self Care | Payer: Managed Care, Other (non HMO) | Source: Ambulatory Visit | Attending: Obstetrics | Admitting: Obstetrics

## 2017-12-02 ENCOUNTER — Encounter (HOSPITAL_COMMUNITY): Payer: Self-pay

## 2017-12-02 DIAGNOSIS — O3402 Maternal care for unspecified congenital malformation of uterus, second trimester: Secondary | ICD-10-CM | POA: Insufficient documentation

## 2017-12-02 DIAGNOSIS — O26892 Other specified pregnancy related conditions, second trimester: Secondary | ICD-10-CM | POA: Diagnosis not present

## 2017-12-02 DIAGNOSIS — Q513 Bicornate uterus: Secondary | ICD-10-CM | POA: Insufficient documentation

## 2017-12-02 DIAGNOSIS — R1030 Lower abdominal pain, unspecified: Secondary | ICD-10-CM | POA: Diagnosis present

## 2017-12-02 DIAGNOSIS — O26899 Other specified pregnancy related conditions, unspecified trimester: Secondary | ICD-10-CM

## 2017-12-02 DIAGNOSIS — Z3A23 23 weeks gestation of pregnancy: Secondary | ICD-10-CM | POA: Insufficient documentation

## 2017-12-02 DIAGNOSIS — Z3A24 24 weeks gestation of pregnancy: Secondary | ICD-10-CM | POA: Diagnosis not present

## 2017-12-02 DIAGNOSIS — R102 Pelvic and perineal pain: Secondary | ICD-10-CM | POA: Diagnosis not present

## 2017-12-02 LAB — WET PREP, GENITAL
Clue Cells Wet Prep HPF POC: NONE SEEN
Sperm: NONE SEEN
Trich, Wet Prep: NONE SEEN
YEAST WET PREP: NONE SEEN

## 2017-12-02 LAB — URINALYSIS, ROUTINE W REFLEX MICROSCOPIC
BILIRUBIN URINE: NEGATIVE
GLUCOSE, UA: NEGATIVE mg/dL
HGB URINE DIPSTICK: NEGATIVE
Ketones, ur: 20 mg/dL — AB
Leukocytes, UA: NEGATIVE
Nitrite: NEGATIVE
PH: 6 (ref 5.0–8.0)
Protein, ur: NEGATIVE mg/dL
SPECIFIC GRAVITY, URINE: 1.008 (ref 1.005–1.030)

## 2017-12-02 NOTE — MAU Provider Note (Signed)
Chief Complaint:  Pelvic Pain and Abdominal Pain   First Provider Initiated Contact with Patient 12/02/17 1720     HPI: Sandra Bauer is a 31 y.o. G3P1011 at 31w6dwho presents to maternity admissions reporting lower abdominal cramping and tightening since yesterday.  Feels a lot of pelvic pressure.. She reports good fetal movement, denies LOF, vaginal bleeding, vaginal itching/burning, urinary symptoms, h/a, dizziness, n/v, diarrhea, constipation or fever/chills.  She denies headache, visual changes or RUQ abdominal pain.  Pelvic Pain  The patient's primary symptoms include pelvic pain. The patient's pertinent negatives include no genital itching, genital lesions, genital odor, vaginal bleeding or vaginal discharge. This is a new problem. The current episode started yesterday. The problem occurs constantly. The problem has been unchanged. The pain is mild. The problem affects both sides. She is pregnant. Associated symptoms include abdominal pain. Pertinent negatives include no constipation, diarrhea, dysuria, fever, headaches, nausea or vomiting. Nothing aggravates the symptoms. She has tried nothing for the symptoms.  Abdominal Pain  This is a new problem. The current episode started yesterday. The onset quality is gradual. The problem occurs constantly. The problem has been unchanged. The pain is located in the LLQ, RLQ and suprapubic region. The quality of the pain is cramping. The abdominal pain does not radiate. Pertinent negatives include no constipation, diarrhea, dysuria, fever, headaches, nausea or vomiting. Nothing aggravates the pain. The pain is relieved by nothing. She has tried nothing for the symptoms.    RN Note: Pt reports abd cramping and tightening since yesterday, feels a lot of pressure and it feels like the baby is moving a lot.     Past Medical History: Past Medical History:  Diagnosis Date  . Anxiety   . Bicornate uterus    Renal u/s 11/2015 to eval for renal  abnormalities: NORMAL  . Depression   . GAD (generalized anxiety disorder)   . Gestational diabetes mellitus 07/2016   Plan is for C/S due to breech presentation + uterine anomaly.  As of 09/29/16 postpartum GYN visit, they plan on checking a 2h GTT.  . SAB (spontaneous abortion) 10/2015  . Seasonal allergic rhinitis     Past obstetric history: OB History  Gravida Para Term Preterm AB Living  3 1 1   1 1   SAB TAB Ectopic Multiple Live Births  1     0 1    # Outcome Date GA Lbr Len/2nd Weight Sex Delivery Anes PTL Lv  3 Current           2 Term 09/01/16 [redacted]w[redacted]d  6 lb 15.3 oz (3.155 kg) F CS-LTranv Spinal  LIV  1 SAB               Past Surgical History: Past Surgical History:  Procedure Laterality Date  . CESAREAN SECTION N/A 09/01/2016   Procedure: CESAREAN SECTION;  Surgeon: Marlow Baars, MD;  Location: [facility] BIRTHING SUITES;  Service: Obstetrics;  Laterality: N/A;  . DILATION AND EVACUATION  10/26/15   for missed abortion  . DILATION AND EVACUATION N/A 10/27/2015   Procedure: DILATATION AND EVACUATION ULTRASOUND;  Surgeon: Marlow Baars, MD;  Location: [facility] ORS;  Service: Gynecology;  Laterality: N/A;  Ultrasound called on 10/26/15 by Donne Hazel RN  . WISDOM TOOTH EXTRACTION  2 YEARS AGO    Family History: Family History  Problem Relation Age of Onset  . Cancer Maternal Grandfather        "stomach"  . Diabetes Paternal Grandfather   . Stroke Paternal Grandfather  Social History: Social History   Tobacco Use  . Smoking status: Never Smoker  . Smokeless tobacco: Never Used  Substance Use Topics  . Alcohol use: No  . Drug use: No    Allergies:  Allergies  Allergen Reactions  . Nuvaring [Etonogestrel-Ethinyl Estradiol]     Vaginal burning, HA, bloating, mood change, nose bleed    Meds:  Medications Prior to Admission  Medication Sig Dispense Refill Last Dose  . calcium carbonate (TUMS - DOSED IN MG ELEMENTAL CALCIUM) 500 MG chewable tablet Chew 1 tablet by  mouth daily as needed for indigestion or heartburn.   Past Week at Unknown time  . Prenatal Vit-Fe Fumarate-FA (PRENATAL MULTIVITAMIN) TABS tablet Take 1 tablet by mouth at bedtime.   09/09/2017 at Unknown time    I have reviewed patient's Past Medical Hx, Surgical Hx, Family Hx, Social Hx, medications and allergies.   ROS:  Review of Systems  Constitutional: Negative for fever.  Gastrointestinal: Positive for abdominal pain. Negative for constipation, diarrhea, nausea and vomiting.  Genitourinary: Positive for pelvic pain. Negative for dysuria and vaginal discharge.  Neurological: Negative for headaches.   Other systems negative  Physical Exam   Patient Vitals for the past 24 hrs:  BP Temp Temp src Pulse Resp SpO2 Height Weight  12/02/17 1704 120/77 98.7 F (37.1 C) Oral 75 15 100 % 5\' 1"  (1.549 m) 170 lb (77.1 kg)   Constitutional: Well-developed, well-nourished female in no acute distress.  Cardiovascular: normal rate and rhythm Respiratory: normal effort, clear to auscultation bilaterally GI: Abd soft, non-tender, gravid appropriate for gestational age.   No rebound or guarding. MS: Extremities nontender, no edema, normal ROM Neurologic: Alert and oriented x 4.  GU: Neg CVAT.  PELVIC EXAM:    Cervix closed and long   FHT:  Baseline 140 , moderate variability, accelerations present, no decelerations Contractions:  Rare   Labs:    Results for orders placed or performed during the hospital encounter of 12/02/17 (from the past 24 hour(s))  Urinalysis, Routine w reflex microscopic     Status: Abnormal   Collection Time: 12/02/17  5:05 PM  Result Value Ref Range   Color, Urine STRAW (A) YELLOW   APPearance CLEAR CLEAR   Specific Gravity, Urine 1.008 1.005 - 1.030   pH 6.0 5.0 - 8.0   Glucose, UA NEGATIVE NEGATIVE mg/dL   Hgb urine dipstick NEGATIVE NEGATIVE   Bilirubin Urine NEGATIVE NEGATIVE   Ketones, ur 20 (A) NEGATIVE mg/dL   Protein, ur NEGATIVE NEGATIVE mg/dL    Nitrite NEGATIVE NEGATIVE   Leukocytes, UA NEGATIVE NEGATIVE  Wet prep, genital     Status: Abnormal   Collection Time: 12/02/17  5:23 PM  Result Value Ref Range   Yeast Wet Prep HPF POC NONE SEEN NONE SEEN   Trich, Wet Prep NONE SEEN NONE SEEN   Clue Cells Wet Prep HPF POC NONE SEEN NONE SEEN   WBC, Wet Prep HPF POC FEW (A) NONE SEEN   Sperm NONE SEEN      Imaging:  No results found.  MAU Course/MDM: I have ordered labs and reviewed results. Dilute urine, no evidence of UTI.  Wet prep is negative NST reviewed and is very reactive for a 23 weeker!   No contractions Consult Dr Chestine Sporelark with presentation, exam findings and test results.  Treatments in MAU included none.    Assessment: Single IUP at 8327w6d Pelvic cramping, uterine stretching vs round ligament pain  Plan: Discharge home Preterm Labor  precautions and fetal kick counts Follow up in Office for prenatal visits and recheck of status  Encouraged to return here or to other Urgent Care/ED if she develops worsening of symptoms, increase in pain, fever, or other concerning symptoms.  Pt stable at time of discharge.  Wynelle BourgeoisMarie Artur Winningham CNM, MSN Certified Nurse-Midwife 12/02/2017 5:20 PM

## 2017-12-02 NOTE — MAU Note (Signed)
Pt reports abd cramping and tightening since yesterday, feels a lot of pressure and it feels like the baby is moving a lot.

## 2017-12-02 NOTE — Progress Notes (Addendum)
G3P1 @ 23.[redacted] wksga. Presents to triage for cramping since yesterday. +FM. Denies LOF or bleeding.   EFM applied.   1724: Provider at bs assessing. Wet prep done. SVE closed thick high  1900: Provider at bs re-assessing and discussing results.   1907: D/c instructions given with pt understanding. Pt left unit via ambulatory with SO

## 2017-12-02 NOTE — Discharge Instructions (Signed)
Second Trimester of Pregnancy The second trimester is from week 13 through week 28, month 4 through 6. This is often the time in pregnancy that you feel your best. Often times, morning sickness has lessened or quit. You may have more energy, and you may get hungry more often. Your unborn baby (fetus) is growing rapidly. At the end of the sixth month, he or she is about 9 inches long and weighs about 1 pounds. You will likely feel the baby move (quickening) between 18 and 20 weeks of pregnancy. Follow these instructions at home:  Avoid all smoking, herbs, and alcohol. Avoid drugs not approved by your doctor.  Do not use any tobacco products, including cigarettes, chewing tobacco, and electronic cigarettes. If you need help quitting, ask your doctor. You may get counseling or other support to help you quit.  Only take medicine as told by your doctor. Some medicines are safe and some are not during pregnancy.  Exercise only as told by your doctor. Stop exercising if you start having cramps.  Eat regular, healthy meals.  Wear a good support bra if your breasts are tender.  Do not use hot tubs, steam rooms, or saunas.  Wear your seat belt when driving.  Avoid raw meat, uncooked cheese, and liter boxes and soil used by cats.  Take your prenatal vitamins.  Take 1500-2000 milligrams of calcium daily starting at the 20th week of pregnancy until you deliver your baby.  Try taking medicine that helps you poop (stool softener) as needed, and if your doctor approves. Eat more fiber by eating fresh fruit, vegetables, and whole grains. Drink enough fluids to keep your pee (urine) clear or pale yellow.  Take warm water baths (sitz baths) to soothe pain or discomfort caused by hemorrhoids. Use hemorrhoid cream if your doctor approves.  If you have puffy, bulging veins (varicose veins), wear support hose. Raise (elevate) your feet for 15 minutes, 3-4 times a day. Limit salt in your diet.  Avoid heavy  lifting, wear low heals, and sit up straight.  Rest with your legs raised if you have leg cramps or low back pain.  Visit your dentist if you have not gone during your pregnancy. Use a soft toothbrush to brush your teeth. Be gentle when you floss.  You can have sex (intercourse) unless your doctor tells you not to.  Go to your doctor visits. Get help if:  You feel dizzy.  You have mild cramps or pressure in your lower belly (abdomen).  You have a nagging pain in your belly area.  You continue to feel sick to your stomach (nauseous), throw up (vomit), or have watery poop (diarrhea).  You have bad smelling fluid coming from your vagina.  You have pain with peeing (urination). Get help right away if:  You have a fever.  You are leaking fluid from your vagina.  You have spotting or bleeding from your vagina.  You have severe belly cramping or pain.  You lose or gain weight rapidly.  You have trouble catching your breath and have chest pain.  You notice sudden or extreme puffiness (swelling) of your face, hands, ankles, feet, or legs.  You have not felt the baby move in over an hour.  You have severe headaches that do not go away with medicine.  You have vision changes. This information is not intended to replace advice given to you by your health care provider. Make sure you discuss any questions you have with your health care   provider. Document Released: 02/28/2010 Document Revised: 05/11/2016 Document Reviewed: 02/04/2013 Elsevier Interactive Patient Education  2017 Elsevier Inc.  

## 2017-12-27 ENCOUNTER — Encounter: Payer: Self-pay | Admitting: Family Medicine

## 2018-01-30 DIAGNOSIS — Z98891 History of uterine scar from previous surgery: Secondary | ICD-10-CM

## 2018-02-28 ENCOUNTER — Inpatient Hospital Stay (HOSPITAL_COMMUNITY): Payer: Managed Care, Other (non HMO) | Admitting: Anesthesiology

## 2018-02-28 ENCOUNTER — Encounter (HOSPITAL_COMMUNITY): Payer: Self-pay | Admitting: *Deleted

## 2018-02-28 ENCOUNTER — Inpatient Hospital Stay (HOSPITAL_COMMUNITY)
Admission: AD | Admit: 2018-02-28 | Discharge: 2018-03-03 | DRG: 788 | Disposition: A | Discharge status: Home / Self Care | Payer: Managed Care, Other (non HMO) | Source: Ambulatory Visit | Attending: Obstetrics and Gynecology | Admitting: Obstetrics and Gynecology

## 2018-02-28 ENCOUNTER — Encounter (HOSPITAL_COMMUNITY)
Admission: AD | Disposition: A | Discharge status: Home / Self Care | Payer: Self-pay | Source: Ambulatory Visit | Attending: Obstetrics and Gynecology

## 2018-02-28 DIAGNOSIS — Q513 Bicornate uterus: Secondary | ICD-10-CM

## 2018-02-28 DIAGNOSIS — O34211 Maternal care for low transverse scar from previous cesarean delivery: Principal | ICD-10-CM | POA: Diagnosis present

## 2018-02-28 DIAGNOSIS — Z9889 Other specified postprocedural states: Secondary | ICD-10-CM

## 2018-02-28 DIAGNOSIS — O3403 Maternal care for unspecified congenital malformation of uterus, third trimester: Secondary | ICD-10-CM | POA: Diagnosis present

## 2018-02-28 DIAGNOSIS — Z3A36 36 weeks gestation of pregnancy: Secondary | ICD-10-CM | POA: Diagnosis not present

## 2018-02-28 LAB — CBC
HCT: 37.1 % (ref 36.0–46.0)
HEMOGLOBIN: 12.5 g/dL (ref 12.0–15.0)
MCH: 28.1 pg (ref 26.0–34.0)
MCHC: 33.7 g/dL (ref 30.0–36.0)
MCV: 83.4 fL (ref 78.0–100.0)
Platelets: 249 10*3/uL (ref 150–400)
RBC: 4.45 MIL/uL (ref 3.87–5.11)
RDW: 14.1 % (ref 11.5–15.5)
WBC: 8.8 10*3/uL (ref 4.0–10.5)

## 2018-02-28 LAB — POCT FERN TEST: POCT Fern Test: POSITIVE

## 2018-02-28 LAB — TYPE AND SCREEN
ABO/RH(D): O POS
Antibody Screen: NEGATIVE

## 2018-02-28 SURGERY — Surgical Case
Anesthesia: Spinal

## 2018-02-28 MED ORDER — WITCH HAZEL-GLYCERIN EX PADS: 1.0000 "application " | MEDICATED_PAD | CUTANEOUS | Status: DC | PRN

## 2018-02-28 MED ORDER — OXYTOCIN 40 UNITS IN LACTATED RINGERS INFUSION - SIMPLE MED: 2.5000 [IU]/h | INTRAVENOUS | Status: AC

## 2018-02-28 MED ORDER — FLEET ENEMA 7-19 GM/118ML RE ENEM: 1.0000 | ENEMA | Freq: Every day | RECTAL | Status: DC | PRN

## 2018-02-28 MED ORDER — PHENYLEPHRINE HCL 10 MG/ML IJ SOLN
INTRAMUSCULAR | Status: DC | PRN
  Administered 2018-02-28 (×3): 80 ug via INTRAVENOUS

## 2018-02-28 MED ORDER — DIPHENHYDRAMINE HCL 50 MG/ML IJ SOLN: 12.5000 mg | INTRAMUSCULAR | Status: DC | PRN

## 2018-02-28 MED ORDER — PHENYLEPHRINE 8 MG IN D5W 100 ML (0.08MG/ML) PREMIX OPTIME
INJECTION | INTRAVENOUS | Status: DC | PRN
  Administered 2018-02-28: 60 ug/min via INTRAVENOUS

## 2018-02-28 MED ORDER — ONDANSETRON HCL 4 MG/2ML IJ SOLN: 4.0000 mg | Freq: Three times a day (TID) | INTRAMUSCULAR | Status: DC | PRN

## 2018-02-28 MED ORDER — PHENYLEPHRINE 8 MG IN D5W 100 ML (0.08MG/ML) PREMIX OPTIME
INJECTION | INTRAVENOUS | Status: AC
  Filled 2018-02-28: qty 100

## 2018-02-28 MED ORDER — MORPHINE SULFATE (PF) 0.5 MG/ML IJ SOLN
INTRAMUSCULAR | Status: AC
  Filled 2018-02-28: qty 10

## 2018-02-28 MED ORDER — BISACODYL 10 MG RE SUPP: 10.0000 mg | Freq: Every day | RECTAL | Status: DC | PRN

## 2018-02-28 MED ORDER — MENTHOL 3 MG MT LOZG: 1.0000 | LOZENGE | OROMUCOSAL | Status: DC | PRN

## 2018-02-28 MED ORDER — DIPHENHYDRAMINE HCL 25 MG PO CAPS: 25.0000 mg | ORAL_CAPSULE | Freq: Four times a day (QID) | ORAL | Status: DC | PRN

## 2018-02-28 MED ORDER — COCONUT OIL OIL: 1.0000 "application " | TOPICAL_OIL | Status: DC | PRN

## 2018-02-28 MED ORDER — ONDANSETRON HCL 4 MG/2ML IJ SOLN
INTRAMUSCULAR | Status: AC
  Filled 2018-02-28: qty 2

## 2018-02-28 MED ORDER — MEPERIDINE HCL 25 MG/ML IJ SOLN
6.2500 mg | INTRAMUSCULAR | Status: DC | PRN
Start: 2018-02-28 — End: 2018-02-28

## 2018-02-28 MED ORDER — PRENATAL MULTIVITAMIN CH
1.0000 | ORAL_TABLET | Freq: Every day | ORAL | Status: DC
  Administered 2018-03-02 – 2018-03-03 (×2): 1 via ORAL
  Filled 2018-02-28 (×2): qty 1

## 2018-02-28 MED ORDER — NALBUPHINE HCL 10 MG/ML IJ SOLN: 5.0000 mg | Freq: Once | INTRAMUSCULAR | Status: AC | PRN

## 2018-02-28 MED ORDER — SODIUM CHLORIDE 0.9% FLUSH: 3.0000 mL | INTRAVENOUS | Status: DC | PRN

## 2018-02-28 MED ORDER — ONDANSETRON HCL 4 MG/2ML IJ SOLN
INTRAMUSCULAR | Status: DC | PRN
  Administered 2018-02-28: 4 mg via INTRAVENOUS

## 2018-02-28 MED ORDER — NALOXONE HCL 4 MG/10ML IJ SOLN
1.0000 ug/kg/h | INTRAVENOUS | Status: DC | PRN
  Filled 2018-02-28: qty 5

## 2018-02-28 MED ORDER — OXYCODONE HCL 5 MG PO TABS
10.0000 mg | ORAL_TABLET | ORAL | Status: DC | PRN
  Administered 2018-03-01 – 2018-03-02 (×2): 10 mg via ORAL
  Filled 2018-02-28 (×2): qty 2

## 2018-02-28 MED ORDER — SOD CITRATE-CITRIC ACID 500-334 MG/5ML PO SOLN
30.0000 mL | Freq: Once | ORAL | Status: AC
  Administered 2018-02-28: 30 mL via ORAL
  Filled 2018-02-28: qty 15

## 2018-02-28 MED ORDER — DEXAMETHASONE SODIUM PHOSPHATE 10 MG/ML IJ SOLN
INTRAMUSCULAR | Status: AC
  Filled 2018-02-28: qty 1

## 2018-02-28 MED ORDER — NALBUPHINE HCL 10 MG/ML IJ SOLN: 5.0000 mg | INTRAMUSCULAR | Status: DC | PRN

## 2018-02-28 MED ORDER — METHYLERGONOVINE MALEATE 0.2 MG/ML IJ SOLN
INTRAMUSCULAR | Status: AC
  Filled 2018-02-28: qty 1

## 2018-02-28 MED ORDER — BUPIVACAINE IN DEXTROSE 0.75-8.25 % IT SOLN
INTRATHECAL | Status: DC | PRN
  Administered 2018-02-28: 1.6 mL via INTRATHECAL

## 2018-02-28 MED ORDER — METHYLERGONOVINE MALEATE 0.2 MG/ML IJ SOLN
0.2000 mg | Freq: Once | INTRAMUSCULAR | Status: AC
  Administered 2018-02-28: 0.2 mg via INTRAMUSCULAR

## 2018-02-28 MED ORDER — TETANUS-DIPHTH-ACELL PERTUSSIS 5-2.5-18.5 LF-MCG/0.5 IM SUSP: 0.5000 mL | Freq: Once | INTRAMUSCULAR | Status: DC

## 2018-02-28 MED ORDER — SENNOSIDES-DOCUSATE SODIUM 8.6-50 MG PO TABS
2.0000 | ORAL_TABLET | ORAL | Status: DC
  Administered 2018-03-01 – 2018-03-03 (×2): 2 via ORAL
  Filled 2018-02-28 (×2): qty 2

## 2018-02-28 MED ORDER — OXYCODONE HCL 5 MG PO TABS
5.0000 mg | ORAL_TABLET | ORAL | Status: DC | PRN
  Administered 2018-03-03: 5 mg via ORAL
  Filled 2018-02-28: qty 1

## 2018-02-28 MED ORDER — NALBUPHINE HCL 10 MG/ML IJ SOLN
5.0000 mg | Freq: Once | INTRAMUSCULAR | Status: AC | PRN
  Administered 2018-03-01: 5 mg via SUBCUTANEOUS
  Filled 2018-02-28: qty 1

## 2018-02-28 MED ORDER — DIBUCAINE 1 % RE OINT: 1.0000 "application " | TOPICAL_OINTMENT | RECTAL | Status: DC | PRN

## 2018-02-28 MED ORDER — IBUPROFEN 800 MG PO TABS
800.0000 mg | ORAL_TABLET | Freq: Three times a day (TID) | ORAL | Status: DC
  Administered 2018-03-01 – 2018-03-03 (×8): 800 mg via ORAL
  Filled 2018-02-28 (×8): qty 1

## 2018-02-28 MED ORDER — DEXAMETHASONE SODIUM PHOSPHATE 10 MG/ML IJ SOLN
INTRAMUSCULAR | Status: DC | PRN
  Administered 2018-02-28: 10 mg via INTRAVENOUS

## 2018-02-28 MED ORDER — METHYLERGONOVINE MALEATE 0.2 MG/ML IJ SOLN: 0.2000 mg | INTRAMUSCULAR | Status: DC | PRN

## 2018-02-28 MED ORDER — PROMETHAZINE HCL 25 MG/ML IJ SOLN
6.2500 mg | INTRAMUSCULAR | Status: DC | PRN
Start: 2018-02-28 — End: 2018-02-28

## 2018-02-28 MED ORDER — SODIUM CHLORIDE 0.9 % IR SOLN
Status: DC | PRN
  Administered 2018-02-28: 1

## 2018-02-28 MED ORDER — LACTATED RINGERS IV SOLN
INTRAVENOUS | Status: DC
  Administered 2018-03-01: 05:00:00 via INTRAVENOUS

## 2018-02-28 MED ORDER — SIMETHICONE 80 MG PO CHEW
80.0000 mg | CHEWABLE_TABLET | ORAL | Status: DC
  Administered 2018-03-01 – 2018-03-03 (×3): 80 mg via ORAL
  Filled 2018-02-28 (×4): qty 1

## 2018-02-28 MED ORDER — FENTANYL CITRATE (PF) 100 MCG/2ML IJ SOLN
INTRAMUSCULAR | Status: DC | PRN
  Administered 2018-02-28: 10 ug via INTRATHECAL

## 2018-02-28 MED ORDER — DIPHENHYDRAMINE HCL 50 MG/ML IJ SOLN
INTRAMUSCULAR | Status: DC | PRN
  Administered 2018-02-28: 25 mg via INTRAVENOUS

## 2018-02-28 MED ORDER — SCOPOLAMINE 1 MG/3DAYS TD PT72
MEDICATED_PATCH | TRANSDERMAL | Status: AC
  Filled 2018-02-28: qty 1

## 2018-02-28 MED ORDER — DIPHENHYDRAMINE HCL 25 MG PO CAPS
25.0000 mg | ORAL_CAPSULE | ORAL | Status: DC | PRN
Start: 2018-02-28 — End: 2018-03-03

## 2018-02-28 MED ORDER — OXYTOCIN 10 UNIT/ML IJ SOLN
INTRAVENOUS | Status: DC | PRN
  Administered 2018-02-28: 40 [IU] via INTRAVENOUS

## 2018-02-28 MED ORDER — FENTANYL CITRATE (PF) 100 MCG/2ML IJ SOLN
INTRAMUSCULAR | Status: AC
  Filled 2018-02-28: qty 2

## 2018-02-28 MED ORDER — NALOXONE HCL 0.4 MG/ML IJ SOLN: 0.4000 mg | INTRAMUSCULAR | Status: DC | PRN

## 2018-02-28 MED ORDER — CEFAZOLIN SODIUM-DEXTROSE 2-4 GM/100ML-% IV SOLN
2.0000 g | INTRAVENOUS | Status: AC
  Administered 2018-02-28: 2 g via INTRAVENOUS
  Filled 2018-02-28: qty 100

## 2018-02-28 MED ORDER — ZOLPIDEM TARTRATE 5 MG PO TABS: 5.0000 mg | ORAL_TABLET | Freq: Every evening | ORAL | Status: DC | PRN

## 2018-02-28 MED ORDER — SCOPOLAMINE 1 MG/3DAYS TD PT72
MEDICATED_PATCH | TRANSDERMAL | Status: DC | PRN
  Administered 2018-02-28: 1 via TRANSDERMAL

## 2018-02-28 MED ORDER — SCOPOLAMINE 1 MG/3DAYS TD PT72
1.0000 | MEDICATED_PATCH | Freq: Once | TRANSDERMAL | Status: DC
  Filled 2018-02-28: qty 1

## 2018-02-28 MED ORDER — ACETAMINOPHEN 325 MG PO TABS
650.0000 mg | ORAL_TABLET | ORAL | Status: DC | PRN
  Administered 2018-03-01: 650 mg via ORAL
  Filled 2018-02-28 (×2): qty 2

## 2018-02-28 MED ORDER — MORPHINE SULFATE (PF) 0.5 MG/ML IJ SOLN
INTRAMUSCULAR | Status: DC | PRN
  Administered 2018-02-28: .2 mg via INTRATHECAL
  Administered 2018-02-28: 1 mg via INTRAVENOUS

## 2018-02-28 MED ORDER — MEASLES, MUMPS & RUBELLA VAC ~~LOC~~ INJ
0.5000 mL | INJECTION | Freq: Once | SUBCUTANEOUS | Status: DC
  Filled 2018-02-28: qty 0.5

## 2018-02-28 MED ORDER — SIMETHICONE 80 MG PO CHEW
80.0000 mg | CHEWABLE_TABLET | Freq: Three times a day (TID) | ORAL | Status: DC
  Administered 2018-03-01 – 2018-03-03 (×7): 80 mg via ORAL
  Filled 2018-02-28 (×8): qty 1

## 2018-02-28 MED ORDER — SIMETHICONE 80 MG PO CHEW: 80.0000 mg | CHEWABLE_TABLET | ORAL | Status: DC | PRN

## 2018-02-28 MED ORDER — METHYLERGONOVINE MALEATE 0.2 MG PO TABS: 0.2000 mg | ORAL_TABLET | ORAL | Status: DC | PRN

## 2018-02-28 MED ORDER — FERROUS SULFATE 325 (65 FE) MG PO TABS
325.0000 mg | ORAL_TABLET | Freq: Two times a day (BID) | ORAL | Status: DC
  Administered 2018-03-01 – 2018-03-03 (×5): 325 mg via ORAL
  Filled 2018-02-28 (×5): qty 1

## 2018-02-28 MED ORDER — FAMOTIDINE IN NACL 20-0.9 MG/50ML-% IV SOLN
20.0000 mg | Freq: Once | INTRAVENOUS | Status: AC
  Administered 2018-02-28: 20 mg via INTRAVENOUS
  Filled 2018-02-28: qty 50

## 2018-02-28 MED ORDER — LACTATED RINGERS IV SOLN
INTRAVENOUS | Status: DC
  Administered 2018-02-28 (×3): via INTRAVENOUS

## 2018-02-28 MED ORDER — OXYTOCIN 10 UNIT/ML IJ SOLN
INTRAMUSCULAR | Status: AC
  Filled 2018-02-28: qty 4

## 2018-02-28 MED ORDER — FENTANYL CITRATE (PF) 100 MCG/2ML IJ SOLN: 25.0000 ug | INTRAMUSCULAR | Status: DC | PRN

## 2018-02-28 SURGICAL SUPPLY — 37 items
BENZOIN TINCTURE PRP APPL 2/3 (GAUZE/BANDAGES/DRESSINGS) ×2 IMPLANT
CHLORAPREP W/TINT 26ML (MISCELLANEOUS) ×2 IMPLANT
CLAMP CORD UMBIL (MISCELLANEOUS) IMPLANT
CLOSURE STERI STRIP 1/2 X4 (GAUZE/BANDAGES/DRESSINGS) ×2 IMPLANT
CLOTH BEACON ORANGE TIMEOUT ST (SAFETY) ×2 IMPLANT
DRSG OPSITE POSTOP 4X10 (GAUZE/BANDAGES/DRESSINGS) ×2 IMPLANT
ELECT REM PT RETURN 9FT ADLT (ELECTROSURGICAL) ×2
ELECTRODE REM PT RTRN 9FT ADLT (ELECTROSURGICAL) ×1 IMPLANT
EXTRACTOR VACUUM KIWI (MISCELLANEOUS) IMPLANT
GLOVE BIOGEL PI IND STRL 6.5 (GLOVE) ×1 IMPLANT
GLOVE BIOGEL PI IND STRL 7.0 (GLOVE) ×1 IMPLANT
GLOVE BIOGEL PI INDICATOR 6.5 (GLOVE) ×1
GLOVE BIOGEL PI INDICATOR 7.0 (GLOVE) ×1
GLOVE ECLIPSE 6.0 STRL STRAW (GLOVE) ×2 IMPLANT
GOWN STRL REUS W/TWL LRG LVL3 (GOWN DISPOSABLE) ×4 IMPLANT
KIT ABG SYR 3ML LUER SLIP (SYRINGE) IMPLANT
NEEDLE HYPO 25X5/8 SAFETYGLIDE (NEEDLE) IMPLANT
NS IRRIG 1000ML POUR BTL (IV SOLUTION) ×2 IMPLANT
PACK C SECTION WH (CUSTOM PROCEDURE TRAY) ×2 IMPLANT
PAD OB MATERNITY 4.3X12.25 (PERSONAL CARE ITEMS) ×2 IMPLANT
PENCIL SMOKE EVAC W/HOLSTER (ELECTROSURGICAL) ×2 IMPLANT
RTRCTR C-SECT PINK 25CM LRG (MISCELLANEOUS) ×2 IMPLANT
STRIP CLOSURE SKIN 1/2X4 (GAUZE/BANDAGES/DRESSINGS) ×2 IMPLANT
SUT MNCRL 0 VIOLET CTX 36 (SUTURE) ×2 IMPLANT
SUT MNCRL AB 3-0 PS2 27 (SUTURE) ×2 IMPLANT
SUT MONOCRYL 0 CTX 36 (SUTURE) ×2
SUT PLAIN 0 NONE (SUTURE) IMPLANT
SUT PLAIN 2 0 (SUTURE) ×1
SUT PLAIN 2 0 XLH (SUTURE) ×2 IMPLANT
SUT PLAIN ABS 2-0 CT1 27XMFL (SUTURE) ×1 IMPLANT
SUT VIC AB 0 CTX 36 (SUTURE) ×2
SUT VIC AB 0 CTX36XBRD ANBCTRL (SUTURE) ×2 IMPLANT
SUT VIC AB 2-0 CT1 27 (SUTURE) ×1
SUT VIC AB 2-0 CT1 TAPERPNT 27 (SUTURE) ×1 IMPLANT
TOWEL OR 17X24 6PK STRL BLUE (TOWEL DISPOSABLE) ×2 IMPLANT
TRAY FOLEY BAG SILVER LF 14FR (SET/KITS/TRAYS/PACK) ×2 IMPLANT
YANKAUER SUCT BULB TIP NO VENT (SUCTIONS) ×2 IMPLANT

## 2018-02-28 NOTE — Op Note (Signed)
02/28/2018  9:08 PM  PATIENT:  Sandra Bauer  32 y.o. female  PRE-OPERATIVE DIAGNOSIS:  REPEAT NKDA EDD: 03/25/18  POST-OPERATIVE DIAGNOSIS:  REPEAT NKDA  PROCEDURE:  Procedure(s): REPEAT CESAREAN SECTION (N/A)  SURGEON:  Surgeon(s) and Role:    [name], MD - Primary   ANESTHESIA:   spinal  EBL:  841 mL   SPECIMEN:  No Specimen  DISPOSITION OF SPECIMEN:  PATHOLOGY  COUNTS:  YES  TOURNIQUET:  * No tourniquets in log *  DICTATION: .Note written in EPIC  PLAN OF CARE: Admit to inpatient   PATIENT DISPOSITION:  PACU - hemodynamically stable.   Delay start of Pharmacological VTE agent (>24hrs) due to surgical blood loss or risk of bleeding: not applicable  Complications:  none Medications:  Ancef, Pitocin Findings:  Baby female, Apgars 7,8, weight P.   Normal tubes, ovaries and uterus seen.  Baby had desaturations of O2 and was transferred to NICU for obs.    Technique:  After adequate spinal anesthesia was achieved, the patient was prepped and draped in usual sterile fashion.  A foley catheter was used to drain the bladder.  A pfannanstiel incision was made with the scalpel and carried down to the fascia with the bovie cautery. The fascia was incised in the midline with the scalpel and carried in a transverse curvilinear manner bilaterally.  The fascia was reflected superiorly and inferiorly off the rectus muscles and the muscles split in the midline.  A bowel free portion of the peritoneum was entered bluntly and then extended in a superior and inferior manner with good visualization of the bowel and bladder.  The Alexis instrument was then placed and the vesico-uterine fascia tented up and incised in a transverse curvilinear manner.  A 2 cm transverse incision was made in the upper portion of the lower uterine segment until the amnion was exposed.   The incision was extended transversely in a blunt manner.  Clear fluid was noted and the baby  delivered in the vertex presentation without complication.  The baby was bulb suctioned and the cord was clamped and cut aftet stripping blood from cord into baby.  The baby was then handed to awaiting Neonatology.  The placenta was then delivered manually and the uterus cleared of all debris.  The uterine incision was then closed with a running lock stitch of 0 monocryl.  An imbricating layer of 0 monocryl was closed as well. Excellent hemostasis of the uterine incision was achieved and the abdomen was cleared with irrigation.  The peritoneum was closed with a running stitch of 2-0 vicryl.  This incorporated the rectus muscles as a separate layer.  The fascia was then closed with a running stitch of 0 vicryl.  The subcutaneous layer was closed with interrupted  stitches of 2-0 plain gut.  The skin was closed with 4-0 vicryl on a Keith needle and steri-strips.  The patient tolerated the procedure well and was returned to the recovery room in stable condition.  All counts were correct times three.  Sandra Bauer A

## 2018-02-28 NOTE — Transfer of Care (Signed)
Immediate Anesthesia Transfer of Care Note  Patient: Sandra Bauer  Procedure(s) Performed: REPEAT CESAREAN SECTION (N/A )  Patient Location: PACU  Anesthesia Type:Spinal  Level of Consciousness: awake, alert  and oriented  Airway & Oxygen Therapy: Patient Spontanous Breathing  Post-op Assessment: Report given to RN and Post -op Vital signs reviewed and stable  Post vital signs: Reviewed and stable  Last Vitals: There were no vitals filed for this visit.  Last Pain:  Vitals:   02/28/18 1813  PainSc: 6          Complications: No apparent anesthesia complications

## 2018-02-28 NOTE — Anesthesia Procedure Notes (Signed)
Spinal  Patient location during procedure: OR Start time: 02/28/2018 8:11 PM End time: 02/28/2018 8:19 PM Staffing Anesthesiologist: Heather RobertsSinger, Jozsef Wescoat, MD Performed: anesthesiologist  Preanesthetic Checklist Completed: patient identified, surgical consent, pre-op evaluation, timeout performed, IV checked, risks and benefits discussed and monitors and equipment checked Spinal Block Patient position: sitting Prep: DuraPrep Patient monitoring: cardiac monitor, continuous pulse ox and blood pressure Approach: midline Location: L2-3 Injection technique: single-shot Needle Needle type: Pencan  Needle gauge: 24 G Needle length: 9 cm Additional Notes Functioning IV was confirmed and monitors were applied. Sterile prep and drape, including hand hygiene and sterile gloves were used. The patient was positioned and the spine was prepped. The skin was anesthetized with lidocaine.  Free flow of clear CSF was obtained prior to injecting local anesthetic into the CSF.  The spinal needle aspirated freely following injection.  The needle was carefully withdrawn.  The patient tolerated the procedure well.

## 2018-02-28 NOTE — Anesthesia Postprocedure Evaluation (Signed)
Anesthesia Post Note  Patient: Sandra Bauer  Procedure(s) Performed: REPEAT CESAREAN SECTION (N/A )     Patient location during evaluation: PACU Anesthesia Type: Spinal Level of consciousness: awake and alert Pain management: pain level controlled Vital Signs Assessment: post-procedure vital signs reviewed and stable Respiratory status: spontaneous breathing and respiratory function stable Cardiovascular status: blood pressure returned to baseline and stable Postop Assessment: spinal receding Anesthetic complications: no    Last Vitals:  Vitals:   02/28/18 2215 02/28/18 2230  BP: 109/70   Pulse: 88 64  Resp: 14 16  Temp:    SpO2: 100% 100%    Last Pain:  Vitals:   02/28/18 2230  TempSrc:   PainSc: 0-No pain   Pain Goal:                 Anani Gu DANIEL

## 2018-02-28 NOTE — MAU Note (Signed)
Pt reports a gush of clear fluid at 1600, has felt more leaking out at time. Unsure if she is having contractions but is having a lot of back pain

## 2018-02-28 NOTE — Brief Op Note (Signed)
02/28/2018  9:08 PM  PATIENT:  Rivka BarbaraMaria E Achorn  32 y.o. female  PRE-OPERATIVE DIAGNOSIS:  REPEAT NKDA EDD: 03/25/18  POST-OPERATIVE DIAGNOSIS:  REPEAT NKDA  PROCEDURE:  Procedure(s): REPEAT CESAREAN SECTION (N/A)  SURGEON:  Surgeon(s) and Role:    [name]* Reizel Calzada, MD - Primary   ANESTHESIA:   spinal  EBL:  841 mL   SPECIMEN:  No Specimen  DISPOSITION OF SPECIMEN:  PATHOLOGY  COUNTS:  YES  TOURNIQUET:  * No tourniquets in log *  DICTATION: .Note written in EPIC  PLAN OF CARE: Admit to inpatient   PATIENT DISPOSITION:  PACU - hemodynamically stable.   Delay start of Pharmacological VTE agent (>24hrs) due to surgical blood loss or risk of bleeding: not applicable

## 2018-02-28 NOTE — Anesthesia Preprocedure Evaluation (Signed)
Anesthesia Evaluation  Patient identified by MRN, date of birth, ID band Patient awake    Reviewed: Allergy & Precautions, H&P , NPO status , Patient's Chart, lab work & pertinent test results  History of Anesthesia Complications Negative for: history of anesthetic complications  Airway Mallampati: I  TM Distance: >3 FB Neck ROM: full    Dental no notable dental hx. (+) Dental Advisory Given   Pulmonary neg pulmonary ROS,    Pulmonary exam normal        Cardiovascular negative cardio ROS Normal cardiovascular exam     Neuro/Psych negative neurological ROS  negative psych ROS   GI/Hepatic negative GI ROS, Neg liver ROS,   Endo/Other  negative endocrine ROSdiabetes  Renal/GU negative Renal ROS     Musculoskeletal   Abdominal (+) + obese,   Peds  Hematology negative hematology ROS (+)   Anesthesia Other Findings   Reproductive/Obstetrics (+) Pregnancy                             Anesthesia Physical  Anesthesia Plan  ASA: II  Anesthesia Plan: Spinal   Post-op Pain Management:    Induction:   PONV Risk Score and Plan:   Airway Management Planned: Natural Airway  Additional Equipment:   Intra-op Plan:   Post-operative Plan:   Informed Consent: I have reviewed the patients History and Physical, chart, labs and discussed the procedure including the risks, benefits and alternatives for the proposed anesthesia with the patient or authorized representative who has indicated his/her understanding and acceptance.   Dental advisory given  Plan Discussed with: CRNA, Surgeon and Anesthesiologist  Anesthesia Plan Comments:         Anesthesia Quick Evaluation

## 2018-02-28 NOTE — H&P (Signed)
32 y.o.  G3P1011 3163w3d comes in ruptured at late preterm; pt is for a repeat cesarean section at term.  Patient has good fetal movement and no bleeding.    Past Medical History:  Diagnosis Date  . Anxiety   . Bicornate uterus    Renal u/s 11/2015 to eval for renal abnormalities: NORMAL  . Depression   . GAD (generalized anxiety disorder)   . Gestational diabetes   . Gestational diabetes mellitus 07/2016   Plan is for C/S due to breech presentation + uterine anomaly.  As of 09/29/16 postpartum GYN visit, they plan on checking a 2h GTT.  . SAB (spontaneous abortion) 10/2015  . Seasonal allergic rhinitis     Past Surgical History:  Procedure Laterality Date  . CESAREAN SECTION N/A 09/01/2016   Procedure: CESAREAN SECTION;  Surgeon: Marlow Baarsyanna Clark, MD;  [facility];  Service: Obstetrics;  Laterality: N/A;  . DILATION AND EVACUATION  10/26/15   for missed abortion  . DILATION AND EVACUATION N/A 10/27/2015   Procedure: DILATATION AND EVACUATION ULTRASOUND;  Surgeon: Marlow Baarsyanna Clark, MD;  Location: [facility];  Service: Gynecology;  Laterality: N/A;  Ultrasound called on 10/26/15 by Donne Hazelhassity Stewart RN  . WISDOM TOOTH EXTRACTION  2 YEARS AGO    OB History  Gravida Para Term Preterm AB Living  3 1 1   1 1   SAB TAB Ectopic Multiple Live Births  1     0 1    # Outcome Date GA Lbr Len/2nd Weight Sex Delivery Anes PTL Lv  3 Current           2 Term 09/01/16 6166w3d  6 lb 15.3 oz (3.155 kg) F CS-LTranv Spinal  LIV  1 SAB               Social History   Socioeconomic History  . Marital status: Married    Spouse name: Not on file  . Number of children: Not on file  . Years of education: Not on file  . Highest education level: Not on file  Social Needs  . Financial resource strain: Not on file  . Food insecurity - worry: Not on file  . Food insecurity - inability: Not on file  . Transportation needs - medical: Not on file  . Transportation needs - non-medical: Not on file   Occupational History  . Not on file  Tobacco Use  . Smoking status: Never Smoker  . Smokeless tobacco: Never Used  Substance and Sexual Activity  . Alcohol use: No  . Drug use: No  . Sexual activity: Yes    Birth control/protection: None  Other Topics Concern  . Not on file  Social History Narrative   Married 2015.   Orig from [place], lives in [place], [place].   Has three younger sisters in [place].   Attending nursing school, works full time at [employer] [place]   No T/A/Ds.   Nuvaring [etonogestrel-ethinyl estradiol]   Prenatal Course: uncomplicated.   Prenatal Transfer Tool  Maternal Diabetes: No Genetic Screening: Normal Maternal Ultrasounds/Referrals: Normal Fetal Ultrasounds or other Referrals:  None Maternal Substance Abuse:  No Significant Maternal Medications:  None Significant Maternal Lab Results: None  Vitals:   02/28/18 1855  Weight: 181 lb (82.1 kg)  Height: 5\' 1"  (1.549 m)    Lungs/Cor:  NAD Abdomen:  soft, gravid Ex:  no cords, erythema SVE:  NA FHTs:  NST R  A/P   For repeat cesarean section at late preterm for  SROM.  All risks, benefits and alternatives discussed with patient and she desires to proceed.  Bushra Denman A

## 2018-03-01 ENCOUNTER — Encounter (HOSPITAL_COMMUNITY): Payer: Self-pay | Admitting: Obstetrics and Gynecology

## 2018-03-01 ENCOUNTER — Other Ambulatory Visit: Payer: Self-pay

## 2018-03-01 LAB — CBC
HEMATOCRIT: 34.7 % — AB (ref 36.0–46.0)
HEMOGLOBIN: 11.6 g/dL — AB (ref 12.0–15.0)
MCH: 28 pg (ref 26.0–34.0)
MCHC: 33.4 g/dL (ref 30.0–36.0)
MCV: 83.6 fL (ref 78.0–100.0)
Platelets: 234 10*3/uL (ref 150–400)
RBC: 4.15 MIL/uL (ref 3.87–5.11)
RDW: 14.1 % (ref 11.5–15.5)
WBC: 15.6 10*3/uL — AB (ref 4.0–10.5)

## 2018-03-01 LAB — RPR: RPR Ser Ql: NONREACTIVE

## 2018-03-01 NOTE — Addendum Note (Signed)
Addendum  created 03/01/18 1548 by Shanon PayorGregory, Ximena Todaro M, CRNA   Sign clinical note

## 2018-03-01 NOTE — Lactation Note (Addendum)
This note was copied from a baby's chart. Lactation Consultation Note  Patient Name: Boy Anselm LisMaria Saputo ZOXWR'UToday's Date: 03/01/2018 Reason for consult: Initial assessment;NICU baby;Late-preterm 34-36.6wks;Other (Comment)(history of low milk supply)  Visited with P2 Mom and FOB, baby in NICU 6880w3d, baby 2012 hrs old.  Mom GDM.  Mom has an 6318 month old baby at home, that she tried to breastfeed, but stated she had low milk supply.  Mom also states she had PPD, she switched baby to formula. Mom is feeling much better with this pregnancy.   Mom reports + breast changes.   Breasts noted to be semi-widely spaced.  Ductal tissue palpated.  Will monitor for adequate milk supply.  Mom has a Medela Pump in Style at home from previous child.  Talked about benefits of renting a Medela Symphony hospital grade pump for first 2-4 weeks, for maximum breast stimulation.  Encouraged Mom to pump >8 times per 24 hrs, on initiation setting.  Demonstrated breast massage, and hand expression.  Drops of colostrum expressed.  Mom to use "snappy" container and transport EBM to NICU.   Encouraged STS as much as possible.    NICU Lactation booklet given and encouraged to read.  Mom aware of LC assistance IP and OP, and encouraged to ask for help prn.   Judee ClaraSmith, Nyara Capell E 03/01/2018, 9:07 AM

## 2018-03-01 NOTE — Progress Notes (Signed)
Honeycomb dressing was pulled off when removing the pressure dressing.  No drainage from incision seen.  Incision looks remarkable.  I replaced the honeycomb dressing.

## 2018-03-01 NOTE — Anesthesia Postprocedure Evaluation (Signed)
Anesthesia Post Note  Patient: Sandra Bauer  Procedure(s) Performed: REPEAT CESAREAN SECTION (N/A )     Patient location during evaluation: Mother Baby Anesthesia Type: Spinal Level of consciousness: awake and alert and oriented Pain management: satisfactory to patient Vital Signs Assessment: post-procedure vital signs reviewed and stable Respiratory status: spontaneous breathing and nonlabored ventilation Cardiovascular status: stable Postop Assessment: no headache, no backache, patient able to bend at knees, no signs of nausea or vomiting and adequate PO intake Anesthetic complications: no    Last Vitals:  Vitals:   03/01/18 0803 03/01/18 1232  BP: (!) 106/58 104/60  Pulse: 98 61  Resp: 16 18  Temp: 37 C 36.7 C  SpO2: 100% 100%    Last Pain:  Vitals:   03/01/18 1232  TempSrc: Oral  PainSc:    Pain Goal: Patients Stated Pain Goal: 3 (03/01/18 0806)               Madison HickmanGREGORY,Carrington Mullenax

## 2018-03-01 NOTE — Progress Notes (Signed)
Patient is eating, ambulating, voiding.  Pain control is good.  Appropriate lochia, no complaints.  Baby in NICU doing well.  Vitals:   03/01/18 0215 03/01/18 0501 03/01/18 0803 03/01/18 1232  BP: 109/65 102/62 (!) 106/58 104/60  Pulse: 81 100 98 61  Resp: 16  16 18   Temp: 99.1 F (37.3 C) 98.9 F (37.2 C) 98.6 F (37 C) 98.1 F (36.7 C)  TempSrc: Oral Oral Oral Oral  SpO2: 99% 98% 100% 100%  Weight:      Height:        Fundus firm Inc: c/d/i Ext: no calf tenderness  Lab Results  Component Value Date   WBC 15.6 (H) 03/01/2018   HGB 11.6 (L) 03/01/2018   HCT 34.7 (L) 03/01/2018   MCV 83.6 03/01/2018   PLT 234 03/01/2018    --/--/O POS (03/14 1848)  A/P Post op day# 1 s/p repeat c/s Mom doing well Baby in NICU doing well, per pt baby may be d/c from NICU tonight  Routine care.    Philip AspenALLAHAN, Analissa Bayless

## 2018-03-02 NOTE — Progress Notes (Signed)
Patient is eating, ambulating, voiding.  Pain control is good.  Appropriate lochia, no complaints.  Vitals:   03/01/18 1620 03/01/18 2037 03/01/18 2338 03/02/18 0950  BP: 98/77 (!) 91/53 (!) 98/49 107/63  Pulse: 74 73 70 68  Resp: 16 14 16 20   Temp: 98.1 F (36.7 C) 98.3 F (36.8 C) 97.8 F (36.6 C) 98.5 F (36.9 C)  TempSrc: Oral Oral Oral Oral  SpO2: 100% 100% 100% 99%  Weight:      Height:        Fundus firm Inc: c/d/i Ext: no calf tenderness  Lab Results  Component Value Date   WBC 15.6 (H) 03/01/2018   HGB 11.6 (L) 03/01/2018   HCT 34.7 (L) 03/01/2018   MCV 83.6 03/01/2018   PLT 234 03/01/2018    --/--/O POS (03/14 1848)  A/P Post op day #2 s/p repeat c/s Doing well Baby still in NICU but per pt doing well  Routine care.  Expect d/c 3/17.    Philip AspenALLAHAN, Manish Ruggiero

## 2018-03-03 MED ORDER — OXYCODONE HCL 5 MG PO TABS: 5.0000 mg | ORAL_TABLET | ORAL | 0 refills | Status: DC | PRN

## 2018-03-03 NOTE — Lactation Note (Signed)
This note was copied from a baby's chart. Lactation Consultation Note  Patient Name: Sandra Anselm LisMaria Chapdelaine [id number]'s Date: 03/03/2018 Reason for consult: Follow-up assessment;Late-preterm 34-36.6wks;Infant weight loss;Hyperbilirubinemia  Visited with Mom and FOB of 8731w3d baby at 2565 hrs old. Baby at 10% weight loss.  Baby was in NICU for respiratory transition and hypoglycemia, and transferred back yesterday.    Mom has been breastfeeding baby and offering formula by bottle.  Mom has not been regularly double pumping.    Baby on the breast in cradle hold, Mom holding baby up without support.  Provided pillow support and assisted Mom to hold baby closer to breast.  Mom using scissor hold of breast.  Demonstrated the C hold, and showed her how to do alternate breast compression.  Swallowing identified for Mom.    Talked about importance of limiting time at breast to 30 mins, and then offering 20-30 ml of EBM+/formula by paced bottle.  Mom to regularly pump after each breast feeding to support her milk supply.  Baby under single phototherapy.  Encouraged STS as much as possible.    Feeding Feeding Type: Breast Fed Nipple Type: Slow - flow Length of feed: 10 min  LATCH Score Latch: Grasps breast easily, tongue down, lips flanged, rhythmical sucking.  Audible Swallowing: Spontaneous and intermittent  Type of Nipple: Everted at rest and after stimulation  Comfort (Breast/Nipple): Soft / non-tender  Hold (Positioning): No assistance needed to correctly position infant at breast.  LATCH Score: 10  Interventions Interventions: Breast feeding basics reviewed;Breast compression;Support pillows;Position options;Adjust position;DEBP;Expressed milk  Lactation Tools Discussed/Used Tools: Pump;Bottle Breast pump type: Double-Electric Breast Pump WIC Program: No   Consult Status Consult Status: Follow-up Date: 03/04/18 Follow-up type: In-patient    Sandra Bauer, Sandra Bauer 03/03/2018, 1:58  PM

## 2018-03-03 NOTE — Progress Notes (Signed)
Discharge instructions given to pt. Discussed incision care, when to call the MD, upcoming appointments, and meds. Paper prescriptions given to pt. Pt verbalized understanding and has no questions or concerns at this time.

## 2018-03-03 NOTE — Discharge Summary (Signed)
Obstetric Discharge Summary Reason for Admission: cesarean section Prenatal Procedures: ultrasound Intrapartum Procedures: cesarean: low cervical, transverse Postpartum Procedures: none Complications-Operative and Postpartum: none Hemoglobin  Date Value Ref Range Status  03/01/2018 11.6 (L) 12.0 - 15.0 g/dL Final   HCT  Date Value Ref Range Status  03/01/2018 34.7 (L) 36.0 - 46.0 % Final    Physical Exam:  General: alert and cooperative Lochia: appropriate Uterine Fundus: firm Incision: healing well, no significant drainage DVT Evaluation: No evidence of DVT seen on physical exam.  Discharge Diagnoses: Term Pregnancy-delivered  Discharge Information: Date: 03/03/2018 Activity: pelvic rest Diet: routine Medications: PNV, Ibuprofen and Percocet Condition: stable Instructions: refer to practice specific booklet Discharge to: home Follow-up Information    Sandra ClampHorvath, Michelle, MD Follow up in 3 week(s).   Specialty:  Obstetrics and Gynecology Contact information: [address] Sandra Bauer [phone]           Newborn Data: Live born female  Birth Weight: 7 lb 0.9 oz (3200 g) APGAR: 7, 8  Newborn Delivery   Birth date/time:  02/28/2018 20:34:00 Delivery type:  C-Section, Low Transverse C-section categorization:  Repeat     Home with mother.  Sandra AspenCALLAHAN, Sandra Bauer 03/03/2018, 10:11 AM

## 2018-03-04 ENCOUNTER — Ambulatory Visit: Payer: Self-pay

## 2018-03-04 NOTE — Lactation Note (Addendum)
This note was copied from a baby's chart. Lactation Consultation Note  Patient Name: Sandra Bauer: 03/04/2018   Weight loss 11%.  Mother is breastfeeding, pumping and supplementing with formula. Encouraged mother to increase supplementation to 25-35 ml. Baby 90 hours old rooming now with mother. Mother is pumping q 2.5-3 hours with DEBP with hands on and expression. Praised mother for her efforts. Resized to #27 flanges and provided coconut oil to lubricate flanges. Mother has personal DEBP at home.      Maternal Data    Feeding Feeding Type: Breast Fed Nipple Type: Slow - flow Length of feed: 15 min  LATCH Score                   Interventions    Lactation Tools Discussed/Used Tools: Pump   Consult Status      Hardie PulleyBerkelhammer, Cassian Torelli Boschen 03/04/2018, 3:14 PM

## 2018-03-05 ENCOUNTER — Ambulatory Visit: Payer: Self-pay

## 2018-03-05 NOTE — Lactation Note (Signed)
This note was copied from a baby's chart. Lactation Consultation Note  Patient Name: Sandra Bauer's Date: 03/05/2018  Baby to be discharged today.  Mom currently has baby on breast and active suck/swallows noted.  Mom recently pumped 25 mls from each breast.  She has a pump at home.  Instructed to continue to pump every 2-3 hours and supplement with expressed milk/formula.  Baby gained since yesterday and weight loss at 10 %.  Lactation outpatient services and support reviewed and encouraged prn.   Maternal Data    Feeding Feeding Type: Breast Fed Nipple Type: Slow - flow  LATCH Score                   Interventions    Lactation Tools Discussed/Used     Consult Status      Huston FoleyMOULDEN, [name] 03/05/2018, 11:08 AM

## 2018-03-21 ENCOUNTER — Inpatient Hospital Stay (HOSPITAL_COMMUNITY): Admit: 2018-03-21 | Payer: Managed Care, Other (non HMO) | Admitting: Obstetrics

## 2018-03-29 LAB — HM PAP SMEAR: HM Pap smear: NORMAL

## 2018-04-02 ENCOUNTER — Ambulatory Visit: Payer: Managed Care, Other (non HMO) | Admitting: Family Medicine

## 2018-04-02 ENCOUNTER — Ambulatory Visit (HOSPITAL_BASED_OUTPATIENT_CLINIC_OR_DEPARTMENT_OTHER)
Admission: RE | Admit: 2018-04-02 | Discharge: 2018-04-02 | Disposition: A | Discharge status: Home / Self Care | Payer: Managed Care, Other (non HMO) | Source: Ambulatory Visit | Attending: Family Medicine | Admitting: Family Medicine

## 2018-04-02 ENCOUNTER — Encounter: Payer: Self-pay | Admitting: Family Medicine

## 2018-04-02 ENCOUNTER — Ambulatory Visit: Payer: Self-pay

## 2018-04-02 VITALS — BP 119/81 | HR 99 | Temp 97.9°F | Ht 61.0 in | Wt 175.2 lb

## 2018-04-02 DIAGNOSIS — M7989 Other specified soft tissue disorders: Secondary | ICD-10-CM | POA: Insufficient documentation

## 2018-04-02 DIAGNOSIS — Z98891 History of uterine scar from previous surgery: Secondary | ICD-10-CM

## 2018-04-02 DIAGNOSIS — R079 Chest pain, unspecified: Secondary | ICD-10-CM

## 2018-04-02 DIAGNOSIS — M79605 Pain in left leg: Secondary | ICD-10-CM | POA: Insufficient documentation

## 2018-04-02 NOTE — Progress Notes (Signed)
Sandra Bauer , 01/31/1986, 32 y.o., female MRN: [id number] Patient Care Team    Relationship Specialty Notifications Start End  Sandra Bauer PCP - General Family Medicine  02/28/18   Sandra Bauer Consulting Physician Obstetrics  02/26/16   Sandra Bauer Consulting Physician Obstetrics and Gynecology  05/17/16     Chief Complaint  Patient presents with  . Knee Pain    Pt c/o left Knee pain  X 4 days. She rated pain 5/10,standing makes it worse/lying down makes it better. Use Ibuprofen, Hot and cold therapy for relief.     Subjective: Pt presents for an OV with complaints of left posterior knee pain and swelling of 4 days duration. Pt reports her husband noticed the swelling. She delivered her baby via C-section ~4 weeks ago. She endorses chest pain twice in the last 12 hours. Once occurred when laying flat when she awoke at 3 am this morning. She reports the pain was sharp stabbing pain, that lasted 30 seconds on the left side of her chest. She denies fever, racing heartbeat, palpitations, shortness of breath, diaphoresis or nausea. The chest pain occurred again around 11:30 this afternoon, lasted about 10-15 seconds, occurred when bending over on the left side of her chest, stabbing in nature. The left lower leg pain is worse with standing, even if not moving. She denies any radiation of knee pain, but has calf discomfort as well.  She reports Saturday the pain was very strong, at least 6/10. She denies prior injury to her knee, or current/recent over activity. She denies h/o blood clots or fhx of blood clots.  Pt has tried ibuprofen to ease their symptoms.   Depression screen Sandra Bauer 2/9 10/17/2016 06/27/2016  Decreased Interest 1 0  Down, Depressed, Hopeless 2 0  PHQ - 2 Score 3 0  Altered sleeping 1 -  Tired, decreased energy 2 -  Change in appetite 2 -  Feeling bad or failure about yourself  2 -  Trouble concentrating 0 -  Moving slowly or fidgety/restless 0 -    Suicidal thoughts 1 -  PHQ-9 Score 11 -  Difficult doing work/chores Very difficult -    Allergies  Allergen Reactions  . Nuvaring [Etonogestrel-Ethinyl Estradiol]     Vaginal burning, HA, bloating, mood change, nose bleed   Social History   Tobacco Use  . Smoking status: Never Smoker  . Smokeless tobacco: Never Used  Substance Use Topics  . Alcohol use: No   Past Medical History:  Diagnosis Date  . Anxiety   . Bicornate uterus    Renal u/s 11/2015 to eval for renal abnormalities: NORMAL  . Depression   . GAD (generalized anxiety disorder)   . Gestational diabetes mellitus 07/2016   Plan is for C/S due to breech presentation + uterine anomaly.  As of 09/29/16 postpartum GYN visit, they plan on checking a 2h GTT.  . SAB (spontaneous abortion) 10/2015  . Seasonal allergic rhinitis    Past Surgical History:  Procedure Laterality Date  . CESAREAN SECTION N/A 09/01/2016   Procedure: CESAREAN SECTION;  Surgeon: Sandra Bauer;  Location: [facility];  Service: Obstetrics;  Laterality: N/A;  . CESAREAN SECTION N/A 02/28/2018   Procedure: REPEAT CESAREAN SECTION;  Surgeon: [name], Bauer;  Location: [facility];  Service: Obstetrics;  Laterality: N/A;  . DILATION AND EVACUATION N/A 10/27/2015   Procedure: DILATATION AND EVACUATION ULTRASOUND;  Surgeon: Sandra Bauer;  Location: [facility];  Service:  Gynecology;  Laterality: N/A;  Ultrasound called on 10/26/15 by Donne Hazelhassity Stewart Bauer  . WISDOM TOOTH EXTRACTION  2 YEARS AGO   Family History  Problem Relation Age of Onset  . Cancer Maternal Grandfather        "stomach"  . Diabetes Paternal Grandfather   . Stroke Paternal Grandfather    Allergies as of 04/02/2018      Reactions   Nuvaring [etonogestrel-ethinyl Estradiol]    Vaginal burning, HA, bloating, mood change, nose bleed      Medication List        Accurate as of 04/02/18  3:46 PM. Always use your most recent med list.          prenatal  multivitamin Tabs tablet Take 1 tablet by mouth at bedtime.       All past medical history, surgical history, allergies, family history, immunizations andmedications were updated in the EMR today and reviewed under the history and medication portions of their EMR.     ROS: Negative, with the exception of above mentioned in HPI   Objective:  BP 119/81 (BP Location: Left Arm, Patient Position: Sitting, Cuff Size: Normal)   Pulse 99   Temp 97.9 F (36.6 C) (Oral)   Ht 5\' 1"  (1.549 m)   Wt 175 lb 3.2 oz (79.5 kg)   LMP 06/18/2017   SpO2 99%   BMI 33.10 kg/m  Body mass index is 33.1 kg/m. Gen: Afebrile. No acute distress. Nontoxic in appearance, well developed, well nourished. pleasant female.  HENT: AT. [place]. MMM, no oral lesions.  Eyes:Pupils Equal Round Reactive to light, Extraocular movements intact,  Conjunctiva without redness, discharge or icterus. Neck/lymp/endocrine: Supple,no lymphadenopathy CV: RRR no murmur, clicks, gallops, rubs. No chest TTP.  Chest: CTAB, no wheeze or crackles. Good air movement, normal resp effort.  Abd: Soft. NTND. BS present.  MSK (left LE): no erythema, mild soft tissue swelling posterior knee. TTP posterior knee and distal medial posterior thigh. TTP left calf. Full ROM. Neg Homans. NV intact distally.  Skin: no rashes, purpura or petechiae.  Neuro: Normal gait. PERLA. EOMi. Alert. Oriented x3 . Psych: Normal affect, dress and demeanor. Normal speech. Normal thought content and judgment. EKG: SR. HR 85. PR 134. QT 358. Abnormal precordial QRS contours, right axis dev.  No prior EKG to compare.   No exam data present No results found. No results found for this or any previous visit (from the past 24 hour(s)).  Assessment/Plan: Sandra Bauer is a 32 y.o. female present for OV for  Chest pain, unspecified type/Pain of left lower extremity/S/P cesarean section - VSS. Concern for DVT/PE with surgery 4 weeks ago, left LE pain and swelling, with new  onset stabbing Chest pain x2 today. Pt was advised this could be an emergent situation with that constellation of signs and symptoms. Discussed in great detail the need for emergent care vs what can realistically be achieved in outpatient care. Pt voiced understanding.  - EKG 12-Lead--> Mild right axis dev (age vs strain) - D-Dimer, Quantitative--> if positive will need CTA emergently.  - Troponin I, put in STAT.  - US Venous Img Lower Unilateral Left; Future--> scheduled today at [facility]. If Positive will need to be triaged through ED for treatment.  - discussed in great detail the outpt work up for DVT/PE/CP--> Pt strongly encourgaed to go to ED with any chest pain, sob or worsening leg pain. Until then we will try to manage outpt unless a test is positive,  in which she will need to report to ED. Pt voiced understanding.    Reviewed expectations re: course of current medical issues.  Discussed self-management of symptoms.  Outlined signs and symptoms indicating need for more acute intervention.  Patient verbalized understanding and all questions were answered.  Patient received an After-Visit Summary.    No orders of the defined types were placed in this encounter.   Greater than 40 minutes spent with patient, >50% of time spent face to face counseling and/or coordinating care.    Note is dictated utilizing voice recognition software. Although note has been proof read prior to signing, occasional typographical errors still can be missed. If any questions arise, please do not hesitate to call for verification.   electronically signed by:  Felix Pacini, DO  [facility] [place]

## 2018-04-02 NOTE — Patient Instructions (Addendum)
If ANY chest pain, nausea, sweating, shortness of breath or increase pain call EMS and go to ED.  PLease have [place] of leg done tonight at 5:30. If positive for blood clot you will need to be admitted through the ED for treatment.   If negative, we will call you tomorrow with other results and discuss treatment.   Yes this could be a injury or strain of the knee and coincidentally chest pain. However, the fact you had surgery a few weeks ago and leg pain and chest pain is too strong of a history for a clot to ignore.     Deep Vein Thrombosis Deep vein thrombosis (DVT) is a condition in which a blood clot forms in a deep vein, such as a lower leg, thigh, or arm vein. A clot is blood that has thickened into a gel or solid. This condition is dangerous. It can lead to serious and even life-threatening complications if the clot travels to the lungs and causes a blockage (pulmonary embolism). It can also damage veins in the leg. This can result in leg pain, swelling, discoloration, and sores (post-thrombotic syndrome). What are the causes? This condition may be caused by:  A slowdown of blood flow.  Damage to a vein.  A condition that makes blood clot more easily.  What increases the risk? The following factors may make you more likely to develop this condition:  Being overweight.  Being elderly, especially over age 32.  Sitting or lying down for more than four hours.  Lack of physical activity (sedentary lifestyle).  Being pregnant, giving birth, or having recently given birth.  Taking medicines that contain estrogen.  Smoking.  A history of any of the following: ? Blood clots or blood clotting disease. ? Peripheral vascular disease. ? Inflammatory bowel disease. ? Cancer. ? Heart disease. ? Genetic conditions that affect how blood clots. ? Neurological diseases that affect the legs (leg paresis). ? Injury. ? Major or lengthy surgery. ? A central line placed inside a large  vein.  What are the signs or symptoms? Symptoms of this condition include:  Swelling, pain, or tenderness in an arm or leg.  Warmth, redness, or discoloration in an arm or leg.  If the clot is in your leg, symptoms may be more noticeable or worse when you stand or walk. Some people do not have any symptoms. How is this diagnosed? This condition is diagnosed with:  A medical history.  A physical exam.  Tests, such as: ? Blood tests. These are done to see how your blood clots. ? Imaging tests. These are done to check for clots. Tests may include:  Ultrasound.  CT scan.  MRI.  X-ray.  Venogram. For this test, X-rays are taken after a dye is injected into a vein.  How is this treated? Treatment for this condition depends on the cause, your risk for bleeding or developing more clots, and any medical conditions you have. Treatment may include:  Taking blood thinners (also called anticoagulants). These medicines may be taken by mouth, injected under the skin, or injected through an IV tube (catheter). These medicines prevent clots from forming.  Injecting medicine that dissolves blood clots into the affected vein (catheter-directed thrombolysis).  Having surgery. Surgery may be done to: ? Remove the clot. ? Place a filter in a large vein to catch blood clots before they reach the lungs.  Some treatments may be continued for up to six months. Follow these instructions at home: If you are  taking an oral blood thinner:  Take the medicine exactly as told by your health care provider. Some blood thinners need to be taken at the same time every day. Do not skip a dose.  Ask your health care provider about what foods and drugs interact with the medicine.  Ask about possible side effects. General instructions  Blood thinners can cause easy bruising and difficulty stopping bleeding. Because of this, if you are taking or were given a blood thinner: ? Hold pressure over cuts for  longer than usual. ? Tell your dentist and other health care providers that you are taking blood thinners before having any procedures that can cause bleeding. ? Avoid contact sports.  Take over-the-counter and prescription medicines only as told by your health care provider.  Return to your normal activities as told by your health care provider. Ask your health care provider what activities are safe for you.  Wear compression stockings if recommended by your health care provider.  Keep all follow-up visits as told by your health care provider. This is important. How is this prevented? To lower your risk of developing this condition again:  For 30 or more minutes every day, do an activity that: ? Involves moving your arms and legs. ? Increases your heart rate.  When traveling for longer than four hours: ? Exercise your arms and legs every hour. ? Drink plenty of water. ? Avoid drinking alcohol.  Avoid sitting or lying for a long time without moving your legs.  Stay a healthy weight.  If you are a woman who is older than age 32, avoid unnecessary use of medicines that contain estrogen.  Do not use any products that contain nicotine or tobacco, such as cigarettes and e-cigarettes. This is especially important if you take estrogen medicines. If you need help quitting, ask your health care provider.  Contact a health care provider if:  You miss a dose of your blood thinner.  You have nausea, vomiting, or diarrhea that lasts for more than one day.  Your menstrual period is heavier than usual.  You have unusual bruising. Get help right away if:  You have new or increased pain, swelling, or redness in an arm or leg.  You have numbness or tingling in an arm or leg.  You have shortness of breath.  You have chest pain.  You have a rapid or irregular heartbeat.  You feel light-headed or dizzy.  You cough up blood.  There is blood in your vomit, stool, or urine.  You  have a serious fall or accident, or you hit your head.  You have a severe headache or confusion.  You have a cut that will not stop bleeding. These symptoms may represent a serious problem that is an emergency. Do not wait to see if the symptoms will go away. Get medical help right away. Call your local emergency services (911 in the [place] Do not drive yourself to the hospital. Summary  DVT is a condition in which a blood clot forms in a deep vein, such as a lower leg, thigh, or arm vein.  Symptoms can include swelling, warmth, pain, and redness in your leg or arm.  Treatment may include taking blood thinners, injecting medicine that dissolves blood clots,wearing compression stockings, or surgery.  If you are prescribed blood thinners, take them exactly as told. This information is not intended to replace advice given to you by your health care provider. Make sure you discuss any questions you have with  your health care provider. Document Released: 12/04/2005 Document Revised: 01/06/2017 Document Reviewed: 01/06/2017 Elsevier Interactive Patient Education  2018 [employer]    Pulmonary Embolism A pulmonary embolism (PE) is a sudden blockage or decrease of blood flow in one lung or both lungs. Most blockages come from a blood clot that forms in a lower leg, thigh, or arm vein (deep vein thrombosis, DVT) and travels to the lungs. A clot is blood that has thickened into a gel or solid. PE is a dangerous and life-threatening condition that needs to be treated right away. What are the causes? This condition is usually caused by a blood clot that forms in a vein and moves to the lungs. In rare cases, it may be caused by air, fat, part of a tumor, or other tissue that moves through the veins and into the lungs. What increases the risk? The following factors may make you more likely to develop this condition:  Having DVT or a history of DVT.  Being older than age 32.  Personal or family  history of blood clots or blood clotting disease.  Major or lengthy surgery.  Orthopedic surgery, especially hip or knee replacement.  Traumatic injury, such as breaking a hip or leg.  Spinal cord injury.  Stroke.  Taking medicines that contain estrogen. These include birth control pills and hormone replacement therapy.  Long-term (chronic) lung or heart disease.  Cancer and chemotherapy.  Having a central venous catheter.  Pregnancy and the period after delivery.  What are the signs or symptoms? Symptoms of this condition usually start suddenly and include:  Shortness of breath while active or at rest.  Coughing or coughing up blood or blood-tinged mucus.  Chest pain that is often worse with deep breaths.  Rapid or irregular heartbeat.  Feeling light-headed or dizzy.  Fainting.  Feeling anxious.  Sweating.  Pain and swelling in a leg. This is a symptom of DVT, which can lead to PE.  How is this diagnosed? This condition may be diagnosed based on:  Your medical history.  A physical exam.  Blood tests to check blood oxygen level and how well your blood clots, and a D-dimer blood test, which checks your blood for a substance that is released when a blood clot breaks apart.  CT pulmonary angiogram. This test checks blood flow in and around your lungs.  Ventilation-perfusion scan, also called a lung VQ scan. This test measures air flow and blood flow to the lungs.  Ultrasound of the legs to look for blood clots.  How is this treated? Treatment for this conditions depends on many factors, such as the cause of your PE, your risk for bleeding or developing more clots, and other medical conditions you have. Treatment aims to remove, dissolve, or stop blood clots from forming or growing larger. Treatment may include:  Blood thinning medicines (anticoagulants) to stop clots from forming or growing. These medicines may be given as a pill, as an injection, or through  an IV tube (infusion).  Medicines that dissolve clots (thrombolytics).  A procedure in which a flexible tube is used to remove a blood clot (embolectomy) or deliver medicine to destroy it (catheter-directed thrombolysis).  A procedure in which a filter is inserted into a large vein that carries blood to the heart (inferior vena cava). This filter (vena cava filter) catches blood clots before they reach the lungs.  Surgery to remove the clot (surgical embolectomy). This is rare.  You may need a combination of immediate,  long-term (up to 3 months after diagnosis), and extended (more than 3 months after diagnosis) treatments. Your treatment may continue for several months (maintenance therapy). You and your health care provider will work together to choose the treatment program that is best for you. Follow these instructions at home: If you are taking an anticoagulant medicine:  Take the medicine every day at the same time each day.  Understand what foods and drugs interact with your medicine.  Understand the side effects of this medicine, including excessive bruising or bleeding. Ask your health care provider or pharmacist about other side effects. General instructions  Take over-the-counter and prescription medicines only as told by your health care provider.  Anticoagulant medicines may cause side effects, including easy bruising and difficulty stopping bleeding. If you are prescribed an anticoagulant: ? Hold pressure over cuts for longer than usual. ? Tell your dentist and other health care providers that you are taking anticoagulants before you have any procedure that may cause bleeding. ? Avoid contact sports. ? Be extra careful when handling sharp objects. ? Use a soft toothbrush. Floss with waxed dental floss. ? Shave with an Neurosurgeon.  Wear a medical alert bracelet or carry a medical alert card that says you have had a PE.  Ask your health care provider when you may  return to your normal activities.  Talk with your health care provider about any travel plans. It is important to make sure that you are still able to take your medicine while on trips.  Keep all follow-up visits as told by your health care provider. This is important. How is this prevented? Take these actions to lower your risk of developing another PE:  Exercise regularly. Take frequent walks. For at least 30 minutes every day, engage in: ? Activity that involves moving your arms and legs. ? Activity that encourages good blood flow through your body by increasing your heart rate.  While traveling, drink plenty of water and avoid drinking alcohol. Ask your health care provider if you should wear below-the-knee compression stockings.  Avoid sitting or lying in bed for long periods of time without moving your legs. Exercise your arms and legs every hour during long-distance travel (over 4 hours).  If you are hospitalized or have surgery, ask your health care provider about your risks and what treatments can help prevent blood clots.  Maintain a healthy weight. Ask your health care provider what weight is healthy for you.  If you are a woman who is over age 28, avoid unnecessary use of medicines that contain estrogen, including birth control pills.  Do not use any products that contain nicotine or tobacco, such as cigarettes and e-cigarettes. This is especially important if you take estrogen medicines. If you need help quitting, ask your health care provider.  See your health care provider for regular checkups. This may include blood tests and ultrasound testing on your legs to check for new blood clots.  Contact a health care provider if:  You missed a dose of your blood thinner medicine. Get help right away if:  You have new or increased pain, swelling, warmth, or redness in an arm or leg.  You have numbness or tingling in an arm or leg.  You have shortness of breath while active  or at rest.  You have chest pain.  You have a rapid or irregular heartbeat.  You feel light-headed or dizzy.  You cough up blood.  You have blood in your vomit, stool, or  urine.  You have a fever.  You have abdomen (abdominal) pain.  You have a severe fall or head injury.  You have a severe headache.  You have vision changes.  You cannot move your arms or legs.  You are confused or have memory loss.  You are bleeding for 10 minutes or more, even with strong pressure on the wound. These symptoms may represent a serious problem that is an emergency. Do not wait to see if the symptoms will go away. Get medical help right away. Call your local emergency services (911 in the [place] Do not drive yourself to the hospital. Summary  A pulmonary embolism (PE) is a sudden blockage or decrease of blood flow in one lung or both lungs. PE is a dangerous and life-threatening condition that needs to be treated right away.  Having deep vein thrombosis (DVT) or a history of DVT is the most common risk factor for PE.  Treatments for this condition usually include medicines to thin your blood (anticoagulants) or medicines to break apart blood clots (thrombolytics).  If you are prescribed blood thinners, it is important to take the medicine every single day at the same time each day.  If you have signs of PE or DVT, call your local emergency services (911 in the [place] This information is not intended to replace advice given to you by your health care provider. Make sure you discuss any questions you have with your health care provider. Document Released: 12/01/2000 Document Revised: 01/06/2017 Document Reviewed: 01/06/2017 Elsevier Interactive Patient Education  2018 [employer].

## 2018-04-02 NOTE — Telephone Encounter (Signed)
Pt. Reports pain behind left knee. No redness or swelling. Started Saturday. No injury to leg that she can remember. 4 weeks post-partum. Saw OB-GYN last Friday for follow up. Instructed to go to UC or ED if pain worsens. Verbalizes understanding. Reason for Disposition . Localized pain, redness or hard lump along vein  Answer Assessment - Initial Assessment Questions 1. ONSET: "When did the pain start?"      Saturday 2. LOCATION: "Where is the pain located?"      Behind left knee 3. PAIN: "How bad is the pain?"    (Scale 1-10; or mild, moderate, severe)   -  MILD (1-3): doesn't interfere with normal activities    -  MODERATE (4-7): interferes with normal activities (e.g., work or school) or awakens from sleep, limping    -  SEVERE (8-10): excruciating pain, unable to do any normal activities, unable to walk     6 4. WORK OR EXERCISE: "Has there been any recent work or exercise that involved this part of the body?"      No 5. CAUSE: "What do you think is causing the leg pain?"     Possible blood clot 6. OTHER SYMPTOMS: "Do you have any other symptoms?" (e.g., chest pain, back pain, breathing difficulty, swelling, rash, fever, numbness, weakness)     Chest pain last night - with movement. Lasted 30 seconds 7. PREGNANCY: "Is there any chance you are pregnant?" "When was your last menstrual period?"     No  Protocols used: LEG PAIN-A-AH

## 2018-04-03 LAB — TIQ-NTM

## 2018-04-03 LAB — TROPONIN I

## 2018-04-03 LAB — D-DIMER, QUANTITATIVE (NOT AT ARMC): D DIMER QUANT: 0.34 ug{FEU}/mL (ref <0.50)

## 2018-10-29 ENCOUNTER — Ambulatory Visit: Payer: 59 | Admitting: Family Medicine

## 2018-10-29 VITALS — BP 146/78 | HR 77 | Resp 18

## 2018-10-29 DIAGNOSIS — R079 Chest pain, unspecified: Secondary | ICD-10-CM | POA: Diagnosis not present

## 2018-10-29 DIAGNOSIS — R42 Dizziness and giddiness: Secondary | ICD-10-CM

## 2018-10-29 DIAGNOSIS — M25512 Pain in left shoulder: Secondary | ICD-10-CM | POA: Diagnosis not present

## 2018-10-29 LAB — POCT CBC
GRANULOCYTE PERCENT: 62.9 % (ref 37–80)
HEMATOCRIT: 39.5 % (ref 29–41)
HEMOGLOBIN: 13.4 g/dL (ref 9.5–13.5)
Lymph, poc: 2.5 (ref 0.6–3.4)
MCH: 28.2 pg (ref 27–31.2)
MCHC: 33.9 g/dL (ref 31.8–35.4)
MCV: 83.2 fL (ref 76–111)
MID (cbc): 0.4 (ref 0–0.9)
MPV: 6.9 fL (ref 0–99.8)
POC GRANULOCYTE: 4.8 (ref 2–6.9)
POC LYMPH PERCENT: 32.3 %L (ref 10–50)
POC MID %: 4.8 %M (ref 0–12)
Platelet Count, POC: 364 10*3/uL (ref 142–424)
RBC: 4.74 M/uL (ref 4.04–5.48)
RDW, POC: 13.7 %
WBC: 7.6 10*3/uL (ref 4.6–10.2)

## 2018-10-29 LAB — POCT URINE PREGNANCY: Preg Test, Ur: NEGATIVE

## 2018-10-29 LAB — GLUCOSE, POCT (MANUAL RESULT ENTRY): POC GLUCOSE: 69 mg/dL — AB (ref 70–99)

## 2018-10-29 MED ORDER — ONDANSETRON HCL 4 MG PO TABS: 4.0000 mg | ORAL_TABLET | Freq: Three times a day (TID) | ORAL | 0 refills | Status: DC | PRN

## 2018-10-29 MED ORDER — MECLIZINE HCL 12.5 MG PO TABS: 12.5000 mg | ORAL_TABLET | Freq: Three times a day (TID) | ORAL | 0 refills | Status: DC | PRN

## 2018-10-29 MED ORDER — IBUPROFEN 600 MG PO TABS: 600.0000 mg | ORAL_TABLET | Freq: Three times a day (TID) | ORAL | 0 refills | Status: DC | PRN

## 2018-10-29 NOTE — Progress Notes (Signed)
Chief Complaint  Patient presents with  . Dizziness  . Chest Pain    HPI   Left shoulder pain that started this morning She went to [facility] to check her bp and it was elevated She also felt dizzy like she was going to faint She states that the dizziness is like the room is spinning and it makes her nauseous She has not eaten since this morning She denies pregnancy No heavy menses or anemia Reports that her ears were ringing   She denies personal or family history of heart disease She had a similar episode and went to the ER in April 2019   Past Medical History:  Diagnosis Date  . Anxiety   . Bicornate uterus    Renal u/s 11/2015 to eval for renal abnormalities: NORMAL  . Depression   . GAD (generalized anxiety disorder)   . Gestational diabetes mellitus 07/2016   Plan is for C/S due to breech presentation + uterine anomaly.  As of 09/29/16 postpartum GYN visit, they plan on checking a 2h GTT.  . SAB (spontaneous abortion) 10/2015  . Seasonal allergic rhinitis     Current Outpatient Medications  Medication Sig Dispense Refill  . ibuprofen (ADVIL,MOTRIN) 600 MG tablet Take 1 tablet (600 mg total) by mouth every 8 (eight) hours as needed. 30 tablet 0  . meclizine (ANTIVERT) 12.5 MG tablet Take 1 tablet (12.5 mg total) by mouth 3 (three) times daily as needed for dizziness. 30 tablet 0  . ondansetron (ZOFRAN) 4 MG tablet Take 1 tablet (4 mg total) by mouth every 8 (eight) hours as needed for nausea or vomiting. 20 tablet 0  . Prenatal Vit-Fe Fumarate-FA (PRENATAL MULTIVITAMIN) TABS tablet Take 1 tablet by mouth at bedtime.     No current facility-administered medications for this visit.     Allergies:  Allergies  Allergen Reactions  . Nuvaring [Etonogestrel-Ethinyl Estradiol]     Vaginal burning, HA, bloating, mood change, nose bleed    Past Surgical History:  Procedure Laterality Date  . CESAREAN SECTION N/A 09/01/2016   Procedure: CESAREAN SECTION;  Surgeon:  Marlow Baars, MD;  Location: [facility] BIRTHING SUITES;  Service: Obstetrics;  Laterality: N/A;  . CESAREAN SECTION N/A 02/28/2018   Procedure: REPEAT CESAREAN SECTION;  Surgeon: [name], MD;  Location: [facility] BIRTHING SUITES;  Service: Obstetrics;  Laterality: N/A;  . DILATION AND EVACUATION N/A 10/27/2015   Procedure: DILATATION AND EVACUATION ULTRASOUND;  Surgeon: Marlow Baars, MD;  Location: [facility] ORS;  Service: Gynecology;  Laterality: N/A;  Ultrasound called on 10/26/15 by Donne Hazel RN  . WISDOM TOOTH EXTRACTION  2 YEARS AGO    Social History   Socioeconomic History  . Marital status: Married    Spouse name: Not on file  . Number of children: Not on file  . Years of education: Not on file  . Highest education level: Not on file  Occupational History  . Not on file  Social Needs  . Financial resource strain: Not on file  . Food insecurity:    Worry: Not on file    Inability: Not on file  . Transportation needs:    Medical: Not on file    Non-medical: Not on file  Tobacco Use  . Smoking status: Never Smoker  . Smokeless tobacco: Never Used  Substance and Sexual Activity  . Alcohol use: No  . Drug use: No  . Sexual activity: Yes    Birth control/protection: None  Lifestyle  . Physical activity:  Days per week: Not on file    Minutes per session: Not on file  . Stress: Not on file  Relationships  . Social connections:    Talks on phone: Not on file    Gets together: Not on file    Attends religious service: Not on file    Active member of club or organization: Not on file    Attends meetings of clubs or organizations: Not on file    Relationship status: Not on file  Other Topics Concern  . Not on file  Social History Narrative   Married 2015.   Orig from [place], lives in [place], [place].   Has three younger sisters in [place].   Attending nursing school, works full time at [employer] in [place].   No T/A/Ds.    Family History  Problem Relation Age of Onset  .  Cancer Maternal Grandfather        "stomach"  . Diabetes Paternal Grandfather   . Stroke Paternal Grandfather      ROS Review of Systems See HPI Constitution: No fevers or chills No malaise No diaphoresis Skin: No rash or itching Eyes: no blurry vision, no double vision GU: no dysuria or hematuria Neuro: no dizziness or headaches all others reviewed and negative   Objective: Vitals:   10/30/18 0950  BP: (!) 146/78  Pulse: 77  Resp: 18  SpO2: 98%    Physical Exam Physical Exam  Constitutional: He is oriented to person, place, and time. He appears well-developed and well-nourished.  HENT:  Head: Normocephalic and atraumatic.  Eyes: Conjunctivae and EOM are normal.  Cardiovascular: Normal rate, regular rhythm, normal heart sounds and intact distal pulses.  No murmur heard. Pulmonary/Chest: Effort normal and breath sounds normal. No stridor. No respiratory distress. He has no wheezes.  Neurological: He is alert and oriented to person, place, and time.  Skin: Skin is warm. Capillary refill takes less than 2 seconds.  Psychiatric: He has a normal mood and affect. His behavior is normal. Judgment and thought content normal.   CRANIAL NERVES: CN II: Visual fields are full to confrontation. Fundoscopic exam is normal with sharp discs and no vascular changes. Pupils are round equal and briskly reactive to light. CN III, IV, VI: extraocular movement are normal. No ptosis. CN V: Facial sensation is intact to pinprick in all 3 divisions bilaterally. Corneal responses are intact.  CN VII: Face is symmetric with normal eye closure and smile. CN VIII: Hearing is normal to rubbing fingers CN IX, X: Palate elevates symmetrically. Phonation is normal. CN XI: Head turning and shoulder shrug are intact CN XII: Tongue is midline with normal movements and no atrophy. Reflexes 2+ Coordination normal Finger-nose-finger normal Normal gait  Shoulder with Normal range of motion without  effusion, tenderness or crepitus bilaterally Pt tender over the 2nd and 3rd intercostal space just left lateral of the sternum. Pin point tenderness.   ECG- nsr, no twi, right axis deviation from previous ECG resolved  Assessment and Plan Ardra was seen today for dizziness and chest pain.  Diagnoses and all orders for this visit:  Dizziness and giddiness- concerning for inner ear/vestibular disorder Discussed Vertigo She should follow up with her PCP -     ondansetron (ZOFRAN) 4 MG tablet; Take 1 tablet (4 mg total) by mouth every 8 (eight) hours as needed for nausea or vomiting. -     meclizine (ANTIVERT) 12.5 MG tablet; Take 1 tablet (12.5 mg total) by mouth 3 (three) times daily  as needed for dizziness.  Chest pain, unspecified type- discussed that cannot rule out underlying cardiac but at this point would recommend outpatient work up She should follow up with her PCP -     EKG 12-Lead -     POCT CBC -     POCT glucose (manual entry) -     POCT urine pregnancy  Acute pain of left shoulder-  Advised motrin Follow up with PCP Other orders -     ibuprofen (ADVIL,MOTRIN) 600 MG tablet; Take 1 tablet (600 mg total) by mouth every 8 (eight) hours as needed.     Cleona Doubleday A Brahim Dolman

## 2018-10-29 NOTE — Patient Instructions (Signed)
Dizziness °Dizziness is a common problem. It is a feeling of unsteadiness or light-headedness. You may feel like you are about to faint. Dizziness can lead to injury if you stumble or fall. Anyone can become dizzy, but dizziness is more common in older adults. This condition can be caused by a number of things, including medicines, dehydration, or illness. °Follow these instructions at home: °Eating and drinking °· Drink enough fluid to keep your urine clear or pale yellow. This helps to keep you from becoming dehydrated. Try to drink more clear fluids, such as water. °· Do not drink alcohol. °· Limit your caffeine intake if told to do so by your health care provider. Check ingredients and nutrition facts to see if a food or beverage contains caffeine. °· Limit your salt (sodium) intake if told to do so by your health care provider. Check ingredients and nutrition facts to see if a food or beverage contains sodium. °Activity °· Avoid making quick movements. °? Rise slowly from chairs and steady yourself until you feel okay. °? In the morning, first sit up on the side of the bed. When you feel okay, stand slowly while you hold onto something until you know that your balance is fine. °· If you need to stand in one place for a long time, move your legs often. Tighten and relax the muscles in your legs while you are standing. °· Do not drive or use heavy machinery if you feel dizzy. °· Avoid bending down if you feel dizzy. Place items in your home so that they are easy for you to reach without leaning over. °Lifestyle °· Do not use any products that contain nicotine or tobacco, such as cigarettes and e-cigarettes. If you need help quitting, ask your health care provider. °· Try to reduce your stress level by using methods such as yoga or meditation. Talk with your health care provider if you need help to manage your stress. °General instructions °· Watch your dizziness for any changes. °· Take over-the-counter and  prescription medicines only as told by your health care provider. Talk with your health care provider if you think that your dizziness is caused by a medicine that you are taking. °· Tell a friend or a family member that you are feeling dizzy. If he or she notices any changes in your behavior, have this person call your health care provider. °· Keep all follow-up visits as told by your health care provider. This is important. °Contact a health care provider if: °· Your dizziness does not go away. °· Your dizziness or light-headedness gets worse. °· You feel nauseous. °· You have reduced hearing. °· You have new symptoms. °· You are unsteady on your feet or you feel like the room is spinning. °Get help right away if: °· You vomit or have diarrhea and are unable to eat or drink anything. °· You have problems talking, walking, swallowing, or using your arms, hands, or legs. °· You feel generally weak. °· You are not thinking clearly or you have trouble forming sentences. It may take a friend or family member to notice this. °· You have chest pain, abdominal pain, shortness of breath, or sweating. °· Your vision changes. °· You have any bleeding. °· You have a severe headache. °· You have neck pain or a stiff neck. °· You have a fever. °These symptoms may represent a serious problem that is an emergency. Do not wait to see if the symptoms will go away. Get medical help   right away. Call your local emergency services (911 in the [place] Do not drive yourself to the hospital. °Summary °· Dizziness is a feeling of unsteadiness or light-headedness. This condition can be caused by a number of things, including medicines, dehydration, or illness. °· Anyone can become dizzy, but dizziness is more common in older adults. °· Drink enough fluid to keep your urine clear or pale yellow. Do not drink alcohol. °· Avoid making quick movements if you feel dizzy. Monitor your dizziness for any changes. °This information is not intended to  replace advice given to you by your health care provider. Make sure you discuss any questions you have with your health care provider. °Document Released: 05/30/2001 Document Revised: 01/06/2017 Document Reviewed: 01/06/2017 °Elsevier Interactive Patient Education © 2018 Elsevier Inc. ° °

## 2018-10-30 ENCOUNTER — Encounter: Payer: Self-pay | Admitting: Family Medicine

## 2018-11-01 ENCOUNTER — Ambulatory Visit: Payer: 59 | Admitting: Family Medicine

## 2018-11-01 ENCOUNTER — Encounter: Payer: Self-pay | Admitting: Family Medicine

## 2018-11-01 VITALS — BP 124/82 | HR 83 | Temp 98.6°F | Resp 16 | Ht 61.0 in | Wt 172.1 lb

## 2018-11-01 DIAGNOSIS — Z23 Encounter for immunization: Secondary | ICD-10-CM | POA: Diagnosis not present

## 2018-11-01 DIAGNOSIS — F331 Major depressive disorder, recurrent, moderate: Secondary | ICD-10-CM

## 2018-11-01 DIAGNOSIS — H8110 Benign paroxysmal vertigo, unspecified ear: Secondary | ICD-10-CM

## 2018-11-01 DIAGNOSIS — F411 Generalized anxiety disorder: Secondary | ICD-10-CM | POA: Diagnosis not present

## 2018-11-01 MED ORDER — CLONAZEPAM 0.5 MG PO TABS: 0.5000 mg | ORAL_TABLET | Freq: Two times a day (BID) | ORAL | 0 refills | Status: DC | PRN

## 2018-11-01 MED ORDER — DULOXETINE HCL 30 MG PO CPEP: 30.0000 mg | ORAL_CAPSULE | Freq: Every day | ORAL | 0 refills | Status: DC

## 2018-11-01 NOTE — Progress Notes (Signed)
OFFICE VISIT  11/07/2018   CC:  Chief Complaint  Patient presents with  . Follow-up    UC visit for dizziness/chest pain/left shoulder pain   HPI:    Patient is a 32 y.o.  female who presents for 3 d f/u of a visit to UC on [address] in [place]. Presented there for CP that was atypical, left shoulder pain--all coincidental with onset of vertigo. Bp mildly elevated.  Exam remarkable only for some focal L upper chest TTP. EKG normal.  CBC normal.  Glucose 69.  UPT NEG. Dx vertigo and atypical CP, rx'd zofran, ibuprofen,and meclizine.  Interim hx:  Feels some mild generalized weakness. Still has vertigo with quick movements of the torso and head, and nausea comes with the vertigo. The vertigo lasts seconds at a time. No vomiting or diarrhea.  No URI sx's.  No HA or fever. Anxiety has gone UP again the last few weeks. Has never had vertigo before. No further pain in upper chest and shoulder. She did take the meclizine the night of her visit to UC.   Past Medical History:  Diagnosis Date  . Anxiety   . Bicornate uterus    Renal u/s 11/2015 to eval for renal abnormalities: NORMAL  . Depression   . GAD (generalized anxiety disorder)   . Gestational diabetes mellitus 07/2016   Plan is for C/S due to breech presentation + uterine anomaly.  As of 09/29/16 postpartum GYN visit, they plan on checking a 2h GTT.  . SAB (spontaneous abortion) 10/2015  . Seasonal allergic rhinitis     Past Surgical History:  Procedure Laterality Date  . CESAREAN SECTION N/A 09/01/2016   Procedure: CESAREAN SECTION;  Surgeon: Marlow Baars, MD;  Location: [facility] BIRTHING SUITES;  Service: Obstetrics;  Laterality: N/A;  . CESAREAN SECTION N/A 02/28/2018   Procedure: REPEAT CESAREAN SECTION;  Surgeon: [name], MD;  Location: [facility] BIRTHING SUITES;  Service: Obstetrics;  Laterality: N/A;  . DILATION AND EVACUATION N/A 10/27/2015   Procedure: DILATATION AND EVACUATION ULTRASOUND;  Surgeon: Marlow Baars, MD;   Location: [facility] OR[facility];  Service: Gynecology;  Laterality: N/A;  Ultrasound called on 10/26/15 by Donne Hazel RN  . WISDOM TOOTH EXTRACTION  2 YEARS AGO    Outpatient Medications Prior to Visit  Medication Sig Dispense Refill  . ibuprofen (ADVIL,MOTRIN) 600 MG tablet Take 1 tablet (600 mg total) by mouth every 8 (eight) hours as needed. 30 tablet 0  . meclizine (ANTIVERT) 12.5 MG tablet Take 1 tablet (12.5 mg total) by mouth 3 (three) times daily as needed for dizziness. 30 tablet 0  . ondansetron (ZOFRAN) 4 MG tablet Take 1 tablet (4 mg total) by mouth every 8 (eight) hours as needed for nausea or vomiting. 20 tablet 0  . Prenatal Vit-Fe Fumarate-FA (PRENATAL MULTIVITAMIN) TABS tablet Take 1 tablet by mouth at bedtime.     No facility-administered medications prior to visit.     Allergies  Allergen Reactions  . Nuvaring [Etonogestrel-Ethinyl Estradiol]     Vaginal burning, HA, bloating, mood change, nose bleed    ROS As per HPI  PE: Blood pressure 124/82, pulse 83, temperature 98.6 F (37 C), temperature source Oral, resp. rate 16, height 5\' 1"  (1.549 m), weight 172 lb 2 oz (78.1 kg), last menstrual period 10/19/2018, SpO2 99 %, unknown if currently breastfeeding.  Exam chaperoned by Vanetta Mulders, CMA.  Gen: Alert, well appearing.  Patient is oriented to person, place, time, and situation. AFFECT: pleasant, lucid thought and speech. VWU:JWJX:  no injection, icteris, swelling, or exudate.  EOMI, PERRLA. Mouth: lips without lesion/swelling.  Oral mucosa pink and moist. Oropharynx without erythema, exudate, or swelling.  CV: RRR, no m/r/g.   LUNGS: CTA bilat, nonlabored resps, good aeration in all lung fields Chest wall: mild tenderness to palpation in left infraclavicular region diffusely. Neuro: CN 2-12 intact bilaterally, strength 5/5 in proximal and distal upper extremities and lower extremities bilaterally.  No tremor.  FNF and heel-shin-toe normal bilat.  No ataxia.  Upper extremity  and lower extremity DTRs symmetric.  No pronator drift. Dix-Halpike: head turned to R-->no vertigo or nystagmus with lying back, but with sitting back upright she had vertigo with nausea that had no associated nystagmus--lasted 15 sec or so. Similar results when head turned to L, but milder intensity/shorter duration.     LABS:    Chemistry      Component Value Date/Time   NA 136 08/31/2016 1150   K 3.7 08/31/2016 1150   CL 110 08/31/2016 1150   CO2 19 (L) 08/31/2016 1150   BUN 10 08/31/2016 1150   CREATININE 0.48 08/31/2016 1150      Component Value Date/Time   CALCIUM 8.9 08/31/2016 1150   ALKPHOS 68 02/20/2012 0941   AST 22 02/20/2012 0941   ALT 22 02/20/2012 0941   BILITOT 0.2 (L) 02/20/2012 0941     Lab Results  Component Value Date   WBC 7.6 10/29/2018   HGB 13.4 10/29/2018   HCT 39.5 10/29/2018   MCV 83.2 10/29/2018   PLT 234 03/01/2018   Lab Results  Component Value Date   TSH 0.69 02/20/2012   Lab Results  Component Value Date   DDIMER 0.34 04/02/2018     IMPRESSION AND PLAN:  1) BPPV:  Home epley's maneuver discussed/reviewed, handout given. May continue meclizine for stabilization of light headed feeling in between episodes of vertigo.  2) GAD and MDD, recurrence, w/out psychotic features, w/out SI or HI. She does have a hx of SI in postpartum period while on antidepressant. She asks to get back on antidepressant now.  Duloxetine has been helpful for her in the past so we restarted this today at 30 mg qd dosing.  Also rx'd clonaz 0.5mg , 1-2 bid prn-->this has been helpful for her in the past as well. She is already getting counseling through her church and I encouraged her to continue this.  An After Visit Summary was printed and given to the patient.  FOLLOW UP: Return in about 2 weeks (around 11/15/2018) for f/u mood/anxiety.  Signed:  Santiago BumpersPhil McGowen, MD           11/07/2018

## 2018-11-07 ENCOUNTER — Encounter: Payer: Self-pay | Admitting: Family Medicine

## 2018-11-13 ENCOUNTER — Ambulatory Visit: Payer: 59 | Admitting: Family Medicine

## 2018-11-13 ENCOUNTER — Encounter: Payer: Self-pay | Admitting: Family Medicine

## 2018-11-13 VITALS — BP 126/86 | HR 82 | Resp 16 | Ht 61.0 in | Wt 169.0 lb

## 2018-11-13 DIAGNOSIS — F330 Major depressive disorder, recurrent, mild: Secondary | ICD-10-CM

## 2018-11-13 DIAGNOSIS — F411 Generalized anxiety disorder: Secondary | ICD-10-CM | POA: Diagnosis not present

## 2018-11-13 NOTE — Progress Notes (Signed)
OFFICE VISIT  11/13/2018   CC:  Chief Complaint  Patient presents with  . Follow-up    anxiety    HPI:    Patient is a 32 y.o.  female who presents for 2 wk f/u MDD with GAD. Last visit we restarted her on duloxetine 30 mg qd and clonaz 0.5mg  bid prn, and she was in counseling.  She was also having BPPV at last visit so we discussed home epley maneuvers.  Interim Hx: She is feeling improved, still getting some overwhelmed and panicky.  Has not used the clonazepam yet. No adverse effects from citalopram. She has her two darling kids with her today.  She appears happy, talks about her daily life some, has positive things to say. Counseling is going well.  NO SI or HI.    Past Medical History:  Diagnosis Date  . Bicornate uterus    Renal u/s 11/2015 to eval for renal abnormalities: NORMAL  . GAD (generalized anxiety disorder)   . Gestational diabetes mellitus 07/2016  . Recurrent depression (HCC)   . SAB (spontaneous abortion) 10/2015  . Seasonal allergic rhinitis     Past Surgical History:  Procedure Laterality Date  . CESAREAN SECTION N/A 09/01/2016   Procedure: CESAREAN SECTION;  Surgeon: Marlow Baars, MD;  Location: [facility] BIRTHING SUITES;  Service: Obstetrics;  Laterality: N/A;  . CESAREAN SECTION N/A 02/28/2018   Procedure: REPEAT CESAREAN SECTION;  Surgeon: [name], MD;  Location: [facility] BIRTHING SUITES;  Service: Obstetrics;  Laterality: N/A;  . DILATION AND EVACUATION N/A 10/27/2015   Procedure: DILATATION AND EVACUATION ULTRASOUND;  Surgeon: Marlow Baars, MD;  Location: [facility];  Service: Gynecology;  Laterality: N/A;  Ultrasound called on 10/26/15 by Donne Hazel RN  . WISDOM TOOTH EXTRACTION  2 YEARS AGO    Outpatient Medications Prior to Visit  Medication Sig Dispense Refill  . clonazePAM (KLONOPIN) 0.5 MG tablet Take 1 tablet (0.5 mg total) by mouth 2 (two) times daily as needed for anxiety. 30 tablet 0  . DULoxetine (CYMBALTA) 30 MG capsule Take 1 capsule  (30 mg total) by mouth daily. 30 capsule 0  . ibuprofen (ADVIL,MOTRIN) 600 MG tablet Take 1 tablet (600 mg total) by mouth every 8 (eight) hours as needed. 30 tablet 0  . Prenatal Vit-Fe Fumarate-FA (PRENATAL MULTIVITAMIN) TABS tablet Take 1 tablet by mouth at bedtime.    . meclizine (ANTIVERT) 12.5 MG tablet Take 1 tablet (12.5 mg total) by mouth 3 (three) times daily as needed for dizziness. (Patient not taking: Reported on 11/13/2018) 30 tablet 0  . ondansetron (ZOFRAN) 4 MG tablet Take 1 tablet (4 mg total) by mouth every 8 (eight) hours as needed for nausea or vomiting. (Patient not taking: Reported on 11/13/2018) 20 tablet 0   No facility-administered medications prior to visit.     Allergies  Allergen Reactions  . Nuvaring [Etonogestrel-Ethinyl Estradiol]     Vaginal burning, HA, bloating, mood change, nose bleed    ROS As per HPI  PE: Blood pressure 126/86, pulse 82, resp. rate 16, height 5\' 1"  (1.549 m), weight 169 lb (76.7 kg), last menstrual period 10/19/2018, SpO2 97 %, unknown if currently breastfeeding. Wt Readings from Last 2 Encounters:  11/13/18 169 lb (76.7 kg)  11/01/18 172 lb 2 oz (78.1 kg)    Gen: alert, oriented x 4, affect pleasant.  Lucid thinking and conversation noted. HEENT: PERRLA, EOMI.   Neck: no LAD, mass, or thyromegaly. CV: RRR, no m/r/g LUNGS: CTA bilat, nonlabored. NEURO: no  tremor or tics noted on observation.  Coordination intact. CN 2-12 grossly intact bilaterally, strength 5/5 in all extremeties.  No ataxia.   LABS:    Chemistry      Component Value Date/Time   NA 136 08/31/2016 1150   K 3.7 08/31/2016 1150   CL 110 08/31/2016 1150   CO2 19 (L) 08/31/2016 1150   BUN 10 08/31/2016 1150   CREATININE 0.48 08/31/2016 1150      Component Value Date/Time   CALCIUM 8.9 08/31/2016 1150   ALKPHOS 68 02/20/2012 0941   AST 22 02/20/2012 0941   ALT 22 02/20/2012 0941   BILITOT 0.2 (L) 02/20/2012 0941       IMPRESSION AND PLAN:  MDD,  recurrent,  GAD: all improving.   She has near panic on some days, feels overwhelmed with many worries at those times-->has not tried [place] yet. Plan: continue duloxetine 30mg  qd and I encouraged her to try 1/2 of the 0.5mg  clonaz prn to see if it helps on her big anxiety days (w/out causing any signif sedation or impairment).  She should be able to still work and/or take care of her kids on very low doses of this med.  She said she would likely try this in the future. We'll see her back in 1 mo and if she has plateaued and not in remission then we'll increase her duoxetine to 60mg  qd.  An After Visit Summary was printed and given to the patient.  FOLLOW UP: Return in about 4 weeks (around 12/11/2018) for f/u mood/anx.  Signed:  Santiago BumpersPhil Dakwon Wenberg, MD           11/13/2018

## 2018-12-02 ENCOUNTER — Other Ambulatory Visit: Payer: Self-pay | Admitting: Family Medicine

## 2018-12-02 NOTE — Telephone Encounter (Signed)
Copied from CRM [id number]. Topic: Quick Communication - See Telephone Encounter >> Dec 02, 2018  4:55 PM Trula SladeWalter, Linda F wrote: CRM for notification. See Telephone encounter for: 12/02/18. Patient would like to have her DULoxetine (CYMBALTA) 30 MG capsule medication refilled and sent to her preferred pharmacy [facility] [address]. Patient is completely out of medication.

## 2018-12-03 MED ORDER — DULOXETINE HCL 30 MG PO CPEP: 30.0000 mg | ORAL_CAPSULE | Freq: Every day | ORAL | 0 refills | Status: DC

## 2018-12-09 ENCOUNTER — Ambulatory Visit: Payer: 59 | Admitting: Family Medicine

## 2018-12-09 ENCOUNTER — Encounter: Payer: Self-pay | Admitting: Family Medicine

## 2018-12-09 VITALS — BP 117/73 | HR 76 | Temp 98.4°F | Resp 16 | Ht 61.0 in | Wt 171.2 lb

## 2018-12-09 DIAGNOSIS — F411 Generalized anxiety disorder: Secondary | ICD-10-CM | POA: Diagnosis not present

## 2018-12-09 DIAGNOSIS — E669 Obesity, unspecified: Secondary | ICD-10-CM

## 2018-12-09 DIAGNOSIS — H538 Other visual disturbances: Secondary | ICD-10-CM

## 2018-12-09 DIAGNOSIS — F3341 Major depressive disorder, recurrent, in partial remission: Secondary | ICD-10-CM

## 2018-12-09 DIAGNOSIS — Z8632 Personal history of gestational diabetes: Secondary | ICD-10-CM

## 2018-12-09 LAB — POCT GLYCOSYLATED HEMOGLOBIN (HGB A1C): Hemoglobin A1C: 5.3 % (ref 4.0–5.6)

## 2018-12-09 LAB — GLUCOSE, POCT (MANUAL RESULT ENTRY): POC Glucose: 119 mg/dl — AB (ref 70–99)

## 2018-12-09 MED ORDER — DULOXETINE HCL 60 MG PO CPEP: 60.0000 mg | ORAL_CAPSULE | Freq: Every day | ORAL | 1 refills | Status: DC

## 2018-12-09 NOTE — Progress Notes (Signed)
OFFICE VISIT  12/09/2018   CC:  Chief Complaint  Patient presents with  . Follow-up    mood and anxiety   HPI:    Patient is a 32 y.o.  female who presents for 4 week f/u MDD and anxiety. Last visit she was improved after being on duloxetine for a few weeks, but had not tried [place] yet.  Anxiety: has had to use clonaz on occasion.  Still with pretty steady amount of chronic anxiety, occ gets overwhelmed and has had to use clonaz on several occasions now. No side effects from either dulox or clonaz.  Still some sleep probs, energy not maximal, some irritability, some concentration probs.  Level of depressed mood is pretty minimal.  No SI or HI.   Her vision had been blurry around the time of having her 2nd baby, and she did have gest dm. Her eye doctor noted her vision is MUCh improved now and pt notes she has no blurry vision anymore. She is concerned maybe she has had DM PAST the time of pregnancy, and her gluc's are possibly now going back to normal. (?)  Past Medical History:  Diagnosis Date  . Bicornate uterus    Renal u/s 11/2015 to eval for renal abnormalities: NORMAL  . GAD (generalized anxiety disorder)   . Gestational diabetes mellitus 07/2016  . Recurrent depression (HCC)   . SAB (spontaneous abortion) 10/2015  . Seasonal allergic rhinitis     Past Surgical History:  Procedure Laterality Date  . CESAREAN SECTION N/A 09/01/2016   Procedure: CESAREAN SECTION;  Surgeon: Marlow Baarsyanna Clark, MD;  [facility];  Service: Obstetrics;  Laterality: N/A;  . CESAREAN SECTION N/A 02/28/2018   Procedure: REPEAT CESAREAN SECTION;  Surgeon: Carrington ClampHorvath, Michelle, MD;  [facility];  Service: Obstetrics;  Laterality: N/A;  . DILATION AND EVACUATION N/A 10/27/2015   Procedure: DILATATION AND EVACUATION ULTRASOUND;  Surgeon: Marlow Baarsyanna Clark, MD;  Location: [facility];  Service: Gynecology;  Laterality: N/A;  Ultrasound called on 10/26/15 by Donne Hazelhassity Stewart RN  .  WISDOM TOOTH EXTRACTION  2 YEARS AGO    Outpatient Medications Prior to Visit  Medication Sig Dispense Refill  . clonazePAM (KLONOPIN) 0.5 MG tablet Take 1 tablet (0.5 mg total) by mouth 2 (two) times daily as needed for anxiety. 30 tablet 0  . Prenatal Vit-Fe Fumarate-FA (PRENATAL MULTIVITAMIN) TABS tablet Take 1 tablet by mouth at bedtime.    . DULoxetine (CYMBALTA) 30 MG capsule Take 1 capsule (30 mg total) by mouth daily. 30 capsule 0  . ibuprofen (ADVIL,MOTRIN) 600 MG tablet Take 1 tablet (600 mg total) by mouth every 8 (eight) hours as needed. (Patient not taking: Reported on 12/09/2018) 30 tablet 0  . meclizine (ANTIVERT) 12.5 MG tablet Take 1 tablet (12.5 mg total) by mouth 3 (three) times daily as needed for dizziness. (Patient not taking: Reported on 11/13/2018) 30 tablet 0  . ondansetron (ZOFRAN) 4 MG tablet Take 1 tablet (4 mg total) by mouth every 8 (eight) hours as needed for nausea or vomiting. (Patient not taking: Reported on 11/13/2018) 20 tablet 0   No facility-administered medications prior to visit.     Allergies  Allergen Reactions  . Nuvaring [Etonogestrel-Ethinyl Estradiol]     Vaginal burning, HA, bloating, mood change, nose bleed    ROS As per HPI  PE: Blood pressure 117/73, pulse 76, temperature 98.4 F (36.9 C), temperature source Oral, resp. rate 16, height 5\' 1"  (1.549 m), weight 171 lb 4 oz (  77.7 kg), last menstrual period 11/13/2018, SpO2 98 %, unknown if currently breastfeeding. Gen: Alert, well appearing.  Patient is oriented to person, place, time, and situation. AFFECT: pleasant, lucid thought and speech. No further exam today.  LABS:    Chemistry      Component Value Date/Time   NA 136 08/31/2016 1150   K 3.7 08/31/2016 1150   CL 110 08/31/2016 1150   CO2 19 (L) 08/31/2016 1150   BUN 10 08/31/2016 1150   CREATININE 0.48 08/31/2016 1150      Component Value Date/Time   CALCIUM 8.9 08/31/2016 1150   ALKPHOS 68 02/20/2012 0941   AST 22  02/20/2012 0941   ALT 22 02/20/2012 0941   BILITOT 0.2 (L) 02/20/2012 0941     Lab Results  Component Value Date   HGBA1C 5.3 12/09/2018   POC glucose today (non-fasting)= 119.  POC A1c today= 5.3  IMPRESSION AND PLAN:  1) MDD, recurrent.  Doing well.  GAD->not optimally controlled. Increase duloxetine to 60mg  qd.  Continue clonaz prn--no new rx needed for this today. We'll do CSC next f/u for this med.  2) Blurry vision and hx of gestational DM: nonfasting glucose good today, POC A1c 5.3%. Reassuring.  Watch diet, encouraged exercise.  Spent 25 min with pt today, with >50% of this time spent in counseling and care coordination regarding the above problems.  An After Visit Summary was printed and given to the patient.  FOLLOW UP: Return in about 6 weeks (around 01/20/2019) for f/u mood/anx.  Signed:  Santiago BumpersPhil Lorelei Heikkila, MD           12/09/2018

## 2018-12-16 ENCOUNTER — Other Ambulatory Visit: Payer: Self-pay | Admitting: Family Medicine

## 2019-01-17 ENCOUNTER — Encounter: Payer: Self-pay | Admitting: *Deleted

## 2019-01-17 DIAGNOSIS — Q513 Bicornate uterus: Secondary | ICD-10-CM | POA: Insufficient documentation

## 2019-01-20 ENCOUNTER — Encounter: Payer: Self-pay | Admitting: Family Medicine

## 2019-01-20 ENCOUNTER — Ambulatory Visit: Payer: 59 | Admitting: Family Medicine

## 2019-01-20 VITALS — BP 118/78 | HR 83 | Temp 98.1°F | Resp 16 | Ht 61.0 in | Wt 175.2 lb

## 2019-01-20 DIAGNOSIS — F411 Generalized anxiety disorder: Secondary | ICD-10-CM

## 2019-01-20 DIAGNOSIS — F3342 Major depressive disorder, recurrent, in full remission: Secondary | ICD-10-CM | POA: Diagnosis not present

## 2019-01-20 MED ORDER — CLONAZEPAM 0.5 MG PO TABS: 0.5000 mg | ORAL_TABLET | Freq: Two times a day (BID) | ORAL | 1 refills | Status: DC | PRN

## 2019-01-20 NOTE — Progress Notes (Signed)
OFFICE VISIT  01/20/2019   CC:  Chief Complaint  Patient presents with  . Follow-up    mood and anxiety   HPI:    Patient is a 33 y.o.  female who presents for 6 week f/u MDD and GAD. A/P as of last f/u visit: "MDD, recurrent.  Doing well.  GAD->not optimally controlled. Increase duloxetine to 60mg  qd.  Continue clonaz prn--no new rx needed for this today. We'll do CSC next f/u for this med."  Interim hx: (Clonaz rx filled at pharmacy 11/01/18). "the medicine is helping" but there has been a lot of stressful things going on at work, plus she's worried/stressed about having to change her daughter's diet due to some food allergies.  Has to take clonaz about 3 times per week.  No panic attacks. She is tolerating the 60mg  duloxetine w/out side effects. Mood is not significantly depressed, no hopelessness, no anhedonia, no SI or HI. Some trouble concentrating and she is pretty tired.  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   Past Medical History:  Diagnosis Date  . Bicornate uterus    Renal u/s 11/2015 to eval for renal abnormalities: NORMAL  . GAD (generalized anxiety disorder)   . Gestational diabetes mellitus 07/2016  . Recurrent depression (HCC)   . SAB (spontaneous abortion) 10/2015  . Seasonal allergic rhinitis     Past Surgical History:  Procedure Laterality Date  . CESAREAN SECTION N/A 09/01/2016   Procedure: CESAREAN SECTION;  Surgeon: Marlow Baars, MD;  Location: [facility] BIRTHING SUIT[facility];  Service: Obstetrics;  Laterality: N/A;  . CESAREAN SECTION N/A 02/28/2018   Procedure: REPEAT CESAREAN SECTION;  Surgeon: [name], MD;  Location: [facility] BIRTHING SUITES;  Service: Obstetrics;  Laterality: N/A;  . DILATION AND EVACUATION N/A 10/27/2015   Procedure: DILATATION AND EVACUATION ULTRASOUND;  Surgeon: Marlow Baars, MD;  Location: [facility];  Service: Gynecology;  Laterality: N/A;  Ultrasound called on  10/26/15 by Donne Hazel RN  . WISDOM TOOTH EXTRACTION  2 YEARS AGO    Outpatient Medications Prior to Visit  Medication Sig Dispense Refill  . clonazePAM (KLONOPIN) 0.5 MG tablet Take 1 tablet (0.5 mg total) by mouth 2 (two) times daily as needed for anxiety. 30 tablet 0  . DULoxetine (CYMBALTA) 60 MG capsule Take 1 capsule (60 mg total) by mouth daily. 30 capsule 1  . Prenatal Vit-Fe Fumarate-FA (PRENATAL MULTIVITAMIN) TABS tablet Take 1 tablet by mouth at bedtime.     No facility-administered medications prior to visit.     Allergies  Allergen Reactions  . Nuvaring [Etonogestrel-Ethinyl Estradiol]     Vaginal burning, HA, bloating, mood change, nose bleed    ROS As per HPI  PE: Blood pressure 118/78, pulse 83, temperature 98.1 F (36.7 C), temperature source Oral, resp. rate 16, height 5\' 1"  (1.549 m), weight 175 lb 4 oz (79.5 kg), last menstrual period 12/29/2018, SpO2 98 %, unknown if currently breastfeeding.   Gen: Alert, well appearing.  Patient is oriented to person, place, time, and situation. AFFECT: pleasant, lucid thought and speech. No further exam today.  LABS:    Chemistry      Component Value Date/Time   NA 136 08/31/2016 1150   K 3.7 08/31/2016 1150   CL 110 08/31/2016 1150   CO2 19 (L) 08/31/2016 1150   BUN 10 08/31/2016 1150   CREATININE 0.48 08/31/2016 1150      Component Value Date/Time  CALCIUM 8.9 08/31/2016 1150   ALKPHOS 68 02/20/2012 0941   AST 22 02/20/2012 0941   ALT 22 02/20/2012 0941   BILITOT 0.2 (L) 02/20/2012 0941      IMPRESSION AND PLAN:  1) MDD, recurrent.  In remission on 60mg  duloxetine.  The current medical regimen is effective;  continue present plan and medications.  2) GAD, improved.  We'll keep same regimen of 60mg  qd duloxetine--the mild drowsiness/tired feeling from this med should gradually wear off.  Plus I think a lot of her fatigue is from being mom to 2 small children. Using clonazepam appropriately and it is  helping well. CSC today.  UDS at future f/u visit. New rx for clonaz 0.2815m, 1 bid prn, #60, RF x 1.  An After Visit Summary was printed and given to the patient.  FOLLOW UP: Return in about 3 months (around 04/20/2019) for routine chronic illness f/u.  Signed:  Santiago BumpersPhil Liliana Dang, MD           01/20/2019

## 2019-02-19 ENCOUNTER — Other Ambulatory Visit: Payer: Self-pay | Admitting: Family Medicine

## 2019-04-23 ENCOUNTER — Other Ambulatory Visit: Payer: Self-pay

## 2019-04-23 ENCOUNTER — Ambulatory Visit (INDEPENDENT_AMBULATORY_CARE_PROVIDER_SITE_OTHER): Payer: 59 | Admitting: Family Medicine

## 2019-04-23 ENCOUNTER — Encounter: Payer: Self-pay | Admitting: Family Medicine

## 2019-04-23 DIAGNOSIS — F411 Generalized anxiety disorder: Secondary | ICD-10-CM | POA: Diagnosis not present

## 2019-04-23 DIAGNOSIS — F3342 Major depressive disorder, recurrent, in full remission: Secondary | ICD-10-CM | POA: Diagnosis not present

## 2019-04-23 NOTE — Progress Notes (Signed)
Virtual Visit via Video Note  I connected with pt on 04/23/19 at  9:00 AM EDT by a video enabled telemedicine application and verified that I am speaking with the correct person using two identifiers.  Location patient: home Location provider:work or home office Persons participating in the virtual visit: patient, provider  I discussed the limitations of evaluation and management by telemedicine and the availability of in person appointments. The patient expressed understanding and agreed to proceed.  Telemedicine visit is a necessity given the COVID-19 restrictions in place at the current time.  HPI: 33 y/o female being seen today for 3 mo f/u recurrent MDD and GAD. Was signif improved last f/u visit on cymbalta 60mg  qd and clonaz 0.5mg  bid prn. 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  Controlled substance contract signed at last visit.  Interim hx:   Has not pursued the medical interpreting job any further b/c of interruption of life from covid 19 crisis. Doing fairly well other than acute increases in stress/anxiety at work from [employer] ([employer]).  She has worked on breathing techniques and has not had to use her clonazepam.  Taking care of her 2 kids, doing fine with this. Mood has been stable, only a couple of periods of 1-2 days at a time of feeling depressed. She is able to cope and come out of it fine.  No SI or HI. Sleep is down some but this is b/c of some nightmares and also up at night with daughter scratching a lot from her eczema.   Appetite is not too good->"I just don't think about eating much".     ROS: no CP, no SOB, no wheezing, no cough, no dizziness, no HAs, no rashes, no melena/hematochezia.  No polyuria or polydipsia.  No myalgias or arthralgias. No abd pain or nausea.  Past Medical History:  Diagnosis Date  . Bicornate uterus    Renal u/s 11/2015 to eval for renal abnormalities: NORMAL  . GAD (generalized anxiety  disorder)   . Gestational diabetes mellitus 07/2016  . Recurrent depression (HCC)   . SAB (spontaneous abortion) 10/2015  . Seasonal allergic rhinitis     Past Surgical History:  Procedure Laterality Date  . CESAREAN SECTION N/A 09/01/2016   Procedure: CESAREAN SECTION;  Surgeon: Marlow Baarsyanna Clark, MD;  [facility];  Service: Obstetrics;  Laterality: N/A;  . CESAREAN SECTION N/A 02/28/2018   Procedure: REPEAT CESAREAN SECTION;  Surgeon: Carrington ClampHorvath, Michelle, MD;  [facility];  Service: Obstetrics;  Laterality: N/A;  . DILATION AND EVACUATION N/A 10/27/2015   Procedure: DILATATION AND EVACUATION ULTRASOUND;  Surgeon: Marlow Baarsyanna Clark, MD;  Location: [facility];  Service: Gynecology;  Laterality: N/A;  Ultrasound called on 10/26/15 by Donne Hazelhassity Stewart RN  . WISDOM TOOTH EXTRACTION  2 YEARS AGO    Family History  Problem Relation Age of Onset  . Cancer Maternal Grandfather        "stomach"  . Diabetes Paternal Grandfather   . Stroke Paternal Grandfather      Current Outpatient Medications:  .  clonazePAM (KLONOPIN) 0.5 MG tablet, Take 1 tablet (0.5 mg total) by mouth 2 (two) times daily as needed for anxiety., Disp: 60 tablet, Rfl: 1 .  DULoxetine (CYMBALTA) 60 MG capsule, TAKE ONE CAPSULE BY MOUTH ONE TIME DAILY , Disp: 30 capsule, Rfl: 5 .  Prenatal Vit-Fe Fumarate-FA (PRENATAL MULTIVITAMIN) TABS tablet, Take 1 tablet by mouth at bedtime., Disp: , Rfl:  EXAM:  VITALS per patient if applicable:  GENERAL: alert, oriented, appears well and in no acute distress  HEENT: atraumatic, conjunttiva clear, no obvious abnormalities on inspection of external nose and ears  NECK: normal movements of the head and neck  LUNGS: on inspection no signs of respiratory distress, breathing rate appears normal, no obvious gross SOB, gasping or wheezing  CV: no obvious cyanosis  MS: moves all visible extremities without noticeable abnormality  PSYCH/NEURO: pleasant and  cooperative, no obvious depression or anxiety, speech and thought processing grossly intact  LABS: none today  Lab Results  Component Value Date   HGBA1C 5.3 12/09/2018     Chemistry      Component Value Date/Time   NA 136 08/31/2016 1150   K 3.7 08/31/2016 1150   CL 110 08/31/2016 1150   CO2 19 (L) 08/31/2016 1150   BUN 10 08/31/2016 1150   CREATININE 0.48 08/31/2016 1150      Component Value Date/Time   CALCIUM 8.9 08/31/2016 1150   ALKPHOS 68 02/20/2012 0941   AST 22 02/20/2012 0941   ALT 22 02/20/2012 0941   BILITOT 0.2 (L) 02/20/2012 0941     Lab Results  Component Value Date   WBC 7.6 10/29/2018   HGB 13.4 10/29/2018   HCT 39.5 10/29/2018   MCV 83.2 10/29/2018   PLT 234 03/01/2018    ASSESSMENT AND PLAN:  Discussed the following assessment and plan:  Recurrent MDD, doing fine/stable. GAD-stable.   She is improving on her coping skills. Gave encouragement.  Will continue current med regimen.  She does not need RF of either of her meds at this time. Signs/symptoms to call or return for were reviewed and pt expressed understanding.   I discussed the assessment and treatment plan with the patient. The patient was provided an opportunity to ask questions and all were answered. The patient agreed with the plan and demonstrated an understanding of the instructions.   The patient was advised to call back or seek an in-person evaluation if the symptoms worsen or if the condition fails to improve as anticipated.  F/u: 3 mo  Signed:  Santiago Bumpers, MD           04/23/2019

## 2019-04-28 ENCOUNTER — Telehealth: Payer: Self-pay | Admitting: Family Medicine

## 2019-04-28 NOTE — Telephone Encounter (Signed)
Called patient to verify, she currently is taking klonopin PRN and Cymbalta daily.  Okay to continue these during pregnancy?  Please advise.

## 2019-04-28 NOTE — Telephone Encounter (Signed)
Pt was called and scheduled for Wednesday per pts request.

## 2019-04-28 NOTE — Telephone Encounter (Signed)
Copied from CRM [id number]. Topic: General - Other >> Apr 28, 2019  9:29 AM Doreatha Massed wrote: Reason for CRM: pt just found out she is 5wks preg and need to speak to nurse b/c she is taking anti depressant medications and want to know if she should continue. Please call pt.

## 2019-04-28 NOTE — Telephone Encounter (Signed)
It would be best to stop clonazepam and duloxetine. She should either talk to her OB/GYN about a substitute antidepressant for the duration of her pregnancy OR she can set up virtual visit to talk about it with me.-thx

## 2019-04-30 ENCOUNTER — Encounter: Payer: Self-pay | Admitting: Family Medicine

## 2019-04-30 ENCOUNTER — Ambulatory Visit (INDEPENDENT_AMBULATORY_CARE_PROVIDER_SITE_OTHER): Payer: 59 | Admitting: Family Medicine

## 2019-04-30 ENCOUNTER — Other Ambulatory Visit: Payer: Self-pay

## 2019-04-30 DIAGNOSIS — F3340 Major depressive disorder, recurrent, in remission, unspecified: Secondary | ICD-10-CM

## 2019-04-30 DIAGNOSIS — Z3A01 Less than 8 weeks gestation of pregnancy: Secondary | ICD-10-CM

## 2019-04-30 DIAGNOSIS — F411 Generalized anxiety disorder: Secondary | ICD-10-CM | POA: Diagnosis not present

## 2019-04-30 MED ORDER — DULOXETINE HCL 30 MG PO CPEP: ORAL_CAPSULE | ORAL | 0 refills | Status: DC

## 2019-04-30 NOTE — Progress Notes (Signed)
Virtual Visit via Video Note  I connected with Sandra Bauer on 04/30/19 at  9:00 AM EDT by a video enabled telemedicine application and verified that I am speaking with the correct person using two identifiers.  Location patient: home Location provider:work or home office Persons participating in the virtual visit: patient, provider  I discussed the limitations of evaluation and management by telemedicine and the availability of in person appointments. The patient expressed understanding and agreed to proceed.  Telemedicine visit is a necessity given the COVID-19 restrictions in place at the current time.  HPI: 33 y/o female being seen today to discuss medication concerns in light of a recent POSITIVE home pregnancy test.  LMP 03/27/19.  UPT was positive 3 days ago. She stopped her duloxetine the day after she found out she was pregnant. Within 24h of stopping the duloxetine she started feeling increased anxiety, unable to concentrate, crying easily, tremulous, paresthesias in hands, some n/v and some loose BMs.  No fever, no cough, no SOB.  She has been on duloxetine long term for GAD and recurrent MDD.  She takes clonazepam prn severe anxiety but lately this has been on rare occasion.    ROS: See pertinent positives and negatives per HPI.  Past Medical History:  Diagnosis Date  . Bicornate uterus    Renal u/s 11/2015 to eval for renal abnormalities: NORMAL  . GAD (generalized anxiety disorder)   . Gestational diabetes mellitus 07/2016  . Recurrent depression (HCC)   . SAB (spontaneous abortion) 10/2015  . Seasonal allergic rhinitis     Past Surgical History:  Procedure Laterality Date  . CESAREAN SECTION N/A 09/01/2016   Procedure: CESAREAN SECTION;  Surgeon: Marlow Baars, MD;  Location: [facility] BIRTHING SUIT[facility];  Service: Obstetrics;  Laterality: N/A;  . CESAREAN SECTION N/A 02/28/2018   Procedure: REPEAT CESAREAN SECTION;  Surgeon: [name], MD;  Location: [facility];  Service:  Obstetrics;  Laterality: N/A;  . DILATION AND EVACUATION N/A 10/27/2015   Procedure: DILATATION AND EVACUATION ULTRASOUND;  Surgeon: Marlow Baars, MD;  Location: [facility];  Service: Gynecology;  Laterality: N/A;  Ultrasound called on 10/26/15 by Donne Hazel RN  . WISDOM TOOTH EXTRACTION  2 YEARS AGO    Family History  Problem Relation Age of Onset  . Cancer Maternal Grandfather        "stomach"  . Diabetes Paternal Grandfather   . Stroke Paternal Grandfather     SOCIAL HX: married, 2 young children, works at [employer].  No tob/alc.   Current Outpatient Medications:  .  clonazePAM (KLONOPIN) 0.5 MG tablet, Take 1 tablet (0.5 mg total) by mouth 2 (two) times daily as needed for anxiety., Disp: 60 tablet, Rfl: 1 .  DULoxetine (CYMBALTA) 60 MG capsule, TAKE ONE CAPSULE BY MOUTH ONE TIME DAILY , Disp: 30 capsule, Rfl: 5 .  Prenatal Vit-Fe Fumarate-FA (PRENATAL MULTIVITAMIN) TABS tablet, Take 1 tablet by mouth at bedtime., Disp: , Rfl:   EXAM:  VITALS per patient if applicable:  GENERAL: alert, oriented, appears well and in no acute distress  HEENT: atraumatic, conjunttiva clear, no obvious abnormalities on inspection of external nose and ears  NECK: normal movements of the head and neck  LUNGS: on inspection no signs of respiratory distress, breathing rate appears normal, no obvious gross SOB, gasping or wheezing  CV: no obvious cyanosis  MS: moves all visible extremities without noticeable abnormality  PSYCH/NEURO: pleasant and cooperative, no obvious depression or anxiety, speech and thought processing grossly intact  LABS: none  ASSESSMENT AND PLAN:  Discussed the following assessment and plan:  1) Pregnant, early 1st trimester. Having some illness sx's that could be attributed to hormones but also since abruptly stopping her duloxetine she could be experiencing some withdrawal symptoms.  This would be a fast w/drawal response but she has been sensitive to this in the past  as well. Plan: restart duloxetine at 30mg  dose, take once daily x 5d, then take once every other day for 5 doses, then stop.   I discussed the assessment and treatment plan with the patient. The patient was provided an opportunity to ask questions and all were answered. The patient agreed with the plan and demonstrated an understanding of the instructions.   The patient was advised to call back or seek an in-person evaluation if the symptoms worsen or if the condition fails to improve as anticipated.  F/u: 3 wks, earlier if problems.  Signed:  Santiago BumpersPhil Siya Flurry, MD           04/30/2019

## 2019-05-07 ENCOUNTER — Ambulatory Visit: Payer: Self-pay | Admitting: *Deleted

## 2019-05-07 ENCOUNTER — Ambulatory Visit (INDEPENDENT_AMBULATORY_CARE_PROVIDER_SITE_OTHER): Payer: 59 | Admitting: Family Medicine

## 2019-05-07 ENCOUNTER — Other Ambulatory Visit: Payer: Self-pay

## 2019-05-07 ENCOUNTER — Encounter: Payer: Self-pay | Admitting: Family Medicine

## 2019-05-07 DIAGNOSIS — R112 Nausea with vomiting, unspecified: Secondary | ICD-10-CM | POA: Diagnosis not present

## 2019-05-07 DIAGNOSIS — R42 Dizziness and giddiness: Secondary | ICD-10-CM | POA: Diagnosis not present

## 2019-05-07 DIAGNOSIS — Z3491 Encounter for supervision of normal pregnancy, unspecified, first trimester: Secondary | ICD-10-CM

## 2019-05-07 MED ORDER — DULOXETINE HCL 30 MG PO CPEP: ORAL_CAPSULE | ORAL | 0 refills | Status: DC

## 2019-05-07 MED ORDER — ONDANSETRON HCL 8 MG PO TABS: ORAL_TABLET | ORAL | 1 refills | Status: DC

## 2019-05-07 NOTE — Progress Notes (Signed)
Virtual Visit via Video Note  I connected with pt on 05/07/19 at  3:40 PM EDT by a video enabled telemedicine application and verified that I am speaking with the correct person using two identifiers.  Location patient: home Location provider:work or home office Persons participating in the virtual visit: patient, provider  I discussed the limitations of evaluation and management by telemedicine and the availability of in person appointments. The patient expressed understanding and agreed to proceed.   HPI: 33 y/o female being seen today for nausea and dizziness. She is in early 1st trimester of pregnancy. Onset of dizziness about 3 d/a, doing activities with kids, going up steps, etc. Feels like she is going to pass out.  No palpitations or tremors.  Hands and feet cold. Vomiting about 2 times per day, chronically nauseated.  Drinking plenty of clear liquids.  Having very soft stools last couple of days, multiple per day.  Usually has BM only once a day.  No fevers, no abd or pelvic pain, no vag bleeding.    ROS: no CP, no cough, no SOB, no body aches.  Past Medical History:  Diagnosis Date  . Bicornate uterus    Renal u/s 11/2015 to eval for renal abnormalities: NORMAL  . GAD (generalized anxiety disorder)   . Gestational diabetes mellitus 07/2016  . Recurrent depression (HCC)   . SAB (spontaneous abortion) 10/2015  . Seasonal allergic rhinitis     Past Surgical History:  Procedure Laterality Date  . CESAREAN SECTION N/A 09/01/2016   Procedure: CESAREAN SECTION;  Surgeon: Marlow Baars, MD;  Location: [facility] BIRTHING SUITES;  Service: Obstetrics;  Laterality: N/A;  . CESAREAN SECTION N/A 02/28/2018   Procedure: REPEAT CESAREAN SECTION;  Surgeon: [name], MD;  Location: [facility] BIRTHING SUITES;  Service: Obstetrics;  Laterality: N/A;  . DILATION AND EVACUATION N/A 10/27/2015   Procedure: DILATATION AND EVACUATION ULTRASOUND;  Surgeon: Marlow Baars, MD;  Location: [facility];   Service: Gynecology;  Laterality: N/A;  Ultrasound called on 10/26/15 by Donne Hazel RN  . WISDOM TOOTH EXTRACTION  2 YEARS AGO    Family History  Problem Relation Age of Onset  . Cancer Maternal Grandfather        "stomach"  . Diabetes Paternal Grandfather   . Stroke Paternal Grandfather      Current Outpatient Medications:  .  DULoxetine (CYMBALTA) 30 MG capsule, 1 cap po qd x 5d, then 1 cap po qod x 5d, Disp: 10 capsule, Rfl: 0 .  Prenatal Vit-Fe Fumarate-FA (PRENATAL MULTIVITAMIN) TABS tablet, Take 1 tablet by mouth at bedtime., Disp: , Rfl:  .  clonazePAM (KLONOPIN) 0.5 MG tablet, Take 1 tablet (0.5 mg total) by mouth 2 (two) times daily as needed for anxiety. (Patient not taking: Reported on 04/30/2019), Disp: 60 tablet, Rfl: 1  EXAM:  VITALS per patient if applicable: There were no vitals taken for this visit.   GENERAL: alert, oriented, appears well and in no acute distress  HEENT: atraumatic, conjunttiva clear, no obvious abnormalities on inspection of external nose and ears  NECK: normal movements of the head and neck  LUNGS: on inspection no signs of respiratory distress, breathing rate appears normal, no obvious gross SOB, gasping or wheezing  CV: no obvious cyanosis  MS: moves all visible extremities without noticeable abnormality  PSYCH/NEURO: pleasant and cooperative, no obvious depression or anxiety, speech and thought processing grossly intact  LABS: none today    Chemistry      Component Value Date/Time  NA 136 08/31/2016 1150   K 3.7 08/31/2016 1150   CL 110 08/31/2016 1150   CO2 19 (L) 08/31/2016 1150   BUN 10 08/31/2016 1150   CREATININE 0.48 08/31/2016 1150      Component Value Date/Time   CALCIUM 8.9 08/31/2016 1150   ALKPHOS 68 02/20/2012 0941   AST 22 02/20/2012 0941   ALT 22 02/20/2012 0941   BILITOT 0.2 (L) 02/20/2012 0941     ASSESSMENT AND PLAN:  Discussed the following assessment and plan:  1) Dizziness and nausea: due to  a combination of "normal" 1st trimester pregnancy (n/v + some mild dehydration) "withdrawal" from the ween off her cymbalta. Benefit of getting her feeling better outweighs any risk of cymbalta to her fetus at this time.  Will get her back on 30mg  cymbalta qd x 7d, then take 30mg  qod x 10 dose. Will also start zofran 8 mg tid prn for nausea.  Encouraged her to drink 2 gatoraids per day and 60 oz water per day.   I discussed the assessment and treatment plan with the patient. The patient was provided an opportunity to ask questions and all were answered. The patient agreed with the plan and demonstrated an understanding of the instructions.   The patient was advised to call back or seek an in-person evaluation if the symptoms worsen or if the condition fails to improve as anticipated.  F/u: 2 wks  Signed:  Santiago BumpersPhil Eleaner Dibartolo, MD           05/07/2019

## 2019-05-07 NOTE — Telephone Encounter (Signed)
Pt has been scheduled for this afternoon

## 2019-05-07 NOTE — Telephone Encounter (Signed)
Dizziness without vertigo for about 3 days. Worse while moving around. Positive pregnancy test on 04/27/19. Some nausea and fatigue. No fever, SOB, CP. Discussed fluids and rest. Reviewed urgent symptoms to call back for.  Warm transferred to PCP for virtual appointment.   Reason for Disposition . [1] MODERATE dizziness (e.g., interferes with normal activities) AND [2] has NOT been evaluated by physician for this  (Exception: dizziness caused by heat exposure, sudden standing, or poor fluid intake)  Answer Assessment - Initial Assessment Questions 1. DESCRIPTION: "Describe your dizziness."     Woozy sometimes feel like she may faint. 2. LIGHTHEADED: "Do you feel lightheaded?" (e.g., somewhat faint, woozy, weak upon standing)     Woozy, faint feeling. 3. VERTIGO: "Do you feel like either you or the room is spinning or tilting?" (i.e. vertigo)     No vertigo 4. SEVERITY: "How bad is it?"  "Do you feel like you are going to faint?" "Can you stand and walk?"   - MILD - walking normally   - MODERATE - interferes with normal activities (e.g., work, school)    - SEVERE - unable to stand, requires support to walk, feels like passing out now.      moderate 5. ONSET:  "When did the dizziness begin?"     3 days ago 6. AGGRAVATING FACTORS: "Does anything make it worse?" (e.g., standing, change in head position)     Moving too fast 7. HEART RATE: "Can you tell me your heart rate?" "How many beats in 15 seconds?"  (Note: not all patients can do this)       no 8. CAUSE: "What do you think is causing the dizziness?"    Has been weaning off cymbalta since last week.  9. RECURRENT SYMPTOM: "Have you had dizziness before?" If so, ask: "When was the last time?" "What happened that time?"     no 10. OTHER SYMPTOMS: "Do you have any other symptoms?" (e.g., fever, chest pain, vomiting, diarrhea, bleeding)       Soft stool 11. PREGNANCY: "Is there any chance you are pregnant?" "When was your last menstrual  period?"       Yes, missed period and took pregnancy test 04/27/19 it was positive  Protocols used: DIZZINESS [facility]

## 2019-05-23 ENCOUNTER — Encounter: Payer: Self-pay | Admitting: Family Medicine

## 2019-05-23 ENCOUNTER — Ambulatory Visit: Payer: 59 | Admitting: Family Medicine

## 2019-05-23 ENCOUNTER — Other Ambulatory Visit: Payer: Self-pay

## 2019-05-23 ENCOUNTER — Ambulatory Visit (INDEPENDENT_AMBULATORY_CARE_PROVIDER_SITE_OTHER): Payer: 59 | Admitting: Family Medicine

## 2019-05-23 VITALS — BP 106/68 | Wt 176.0 lb

## 2019-05-23 DIAGNOSIS — F411 Generalized anxiety disorder: Secondary | ICD-10-CM | POA: Diagnosis not present

## 2019-05-23 DIAGNOSIS — Z3491 Encounter for supervision of normal pregnancy, unspecified, first trimester: Secondary | ICD-10-CM

## 2019-05-23 NOTE — Progress Notes (Signed)
Virtual Visit via Video Note  I connected with pt on 05/23/19 at 11:00 AM EDT by a video enabled telemedicine application and verified that I am speaking with the correct person using two identifiers.  Location patient: home Location provider:work or home office Persons participating in the virtual visit: patient, provider  I discussed the limitations of evaluation and management by telemedicine and the availability of in person appointments. The patient expressed understanding and agreed to proceed.  Telemedicine visit is a necessity given the COVID-19 restrictions in place at the current time.  HPI: [age] y/oWF being seen today for 2 wks f/u dizziness and nausea. She was experiencing some mild w/drawal/rebound sx's from weening off of cymbalta PLUS some "normal" n/v of pregnancy.  We stretched out her cymbalta ween a bit and rx'd zofran as well as discussed oral hydration practices.  Interim hx: She is feeling no more dizziness or nausea.  Eating better. Unfortunately her ultrasound at her OB's office showed only a gestational sac.  Either the pregnancy is earlier than her LMP dating OR this will be a spont AB. She has plan to f/u soon with her ob for repeat u/s. No vag bleeding, no pelvic or abd pain. She is relatively peaceful about things at this point, has been praying a lot with her husband and she has successfully stopped her cymbalta.   Her OB reassured her that cymbalta was not the reason for any preg problem and that she could actually take it if she wants during her pregnancy.  Pt is most comfortable at this time with staying off the med.   ROS: See pertinent positives and negatives per HPI.  Past Medical History:  Diagnosis Date  . Bicornate uterus    Renal u/s 11/2015 to eval for renal abnormalities: NORMAL  . GAD (generalized anxiety disorder)   . Gestational diabetes mellitus 07/2016  . Recurrent depression (HCC)   . SAB (spontaneous abortion) 10/2015  . Seasonal  allergic rhinitis     Past Surgical History:  Procedure Laterality Date  . CESAREAN SECTION N/A 09/01/2016   Procedure: CESAREAN SECTION;  Surgeon: Marlow Baarsyanna Clark, MD;  [facility];  Service: Obstetrics;  Laterality: N/A;  . CESAREAN SECTION N/A 02/28/2018   Procedure: REPEAT CESAREAN SECTION;  Surgeon: Carrington ClampHorvath, Michelle, MD;  [facility];  Service: Obstetrics;  Laterality: N/A;  . DILATION AND EVACUATION N/A 10/27/2015   Procedure: DILATATION AND EVACUATION ULTRASOUND;  Surgeon: Marlow Baarsyanna Clark, MD;  Location: [facility];  Service: Gynecology;  Laterality: N/A;  Ultrasound called on 10/26/15 by Donne Hazelhassity Stewart RN  . WISDOM TOOTH EXTRACTION  2 YEARS AGO    Family History  Problem Relation Age of Onset  . Cancer Maternal Grandfather        "stomach"  . Diabetes Paternal Grandfather   . Stroke Paternal Grandfather       Current Outpatient Medications:  .  Prenatal Vit-Fe Fumarate-FA (PRENATAL MULTIVITAMIN) TABS tablet, Take 1 tablet by mouth at bedtime., Disp: , Rfl:  .  clonazePAM (KLONOPIN) 0.5 MG tablet, Take 1 tablet (0.5 mg total) by mouth 2 (two) times daily as needed for anxiety. (Patient not taking: Reported on 04/30/2019), Disp: 60 tablet, Rfl: 1 .  DULoxetine (CYMBALTA) 30 MG capsule, 1 cap po qd x 7d, then 1 cap po qd x 10 doses (Patient not taking: Reported on 05/23/2019), Disp: 17 capsule, Rfl: 0 .  ondansetron (ZOFRAN) 8 MG tablet, 1 tab po tid prn nausea (Patient not taking: Reported on  05/23/2019), Disp: 30 tablet, Rfl: 1  EXAM:  VITALS per patient if applicable: BP 106/68 (BP Location: Left Arm, Patient Position: Sitting, Cuff Size: Normal)   Wt 176 lb (79.8 kg)   BMI 33.25 kg/m    GENERAL: alert, oriented, appears well and in no acute distress  HEENT: atraumatic, conjunttiva clear, no obvious abnormalities on inspection of external nose and ears  NECK: normal movements of the head and neck  LUNGS: on inspection no signs of respiratory  distress, breathing rate appears normal, no obvious gross SOB, gasping or wheezing  CV: no obvious cyanosis  MS: moves all visible extremities without noticeable abnormality  PSYCH/NEURO: pleasant and cooperative, no obvious depression or anxiety, speech and thought processing grossly intact  LABS: none today  ASSESSMENT AND PLAN:  Discussed the following assessment and plan:  1) GAD: doing ok (esp considering the circumstances) since ween off cymbalta.  Has already been completely off her benzo. We'll see how things go as time goes on.  Hopefully her pregnancy will end up being a healthy one. We discussed the way she could restart cymbalta if she chooses to in the future (restart 30mg  qd x 1 wk, then increase to 60mg  qd).   I discussed the assessment and treatment plan with the patient. The patient was provided an opportunity to ask questions and all were answered. The patient agreed with the plan and demonstrated an understanding of the instructions.   The patient was advised to call back or seek an in-person evaluation if the symptoms worsen or if the condition fails to improve as anticipated.  F/u: prn  Signed:  Santiago Bumpers, MD           05/23/2019

## 2019-05-29 ENCOUNTER — Other Ambulatory Visit (HOSPITAL_COMMUNITY)
Admission: RE | Admit: 2019-05-29 | Discharge: 2019-05-29 | Disposition: A | Discharge status: Home / Self Care | Payer: 59 | Source: Ambulatory Visit | Attending: Obstetrics | Admitting: Obstetrics

## 2019-05-29 ENCOUNTER — Other Ambulatory Visit: Payer: Self-pay | Admitting: Obstetrics

## 2019-05-29 DIAGNOSIS — Z1159 Encounter for screening for other viral diseases: Secondary | ICD-10-CM | POA: Insufficient documentation

## 2019-05-30 ENCOUNTER — Encounter (HOSPITAL_BASED_OUTPATIENT_CLINIC_OR_DEPARTMENT_OTHER): Payer: Self-pay | Admitting: *Deleted

## 2019-05-30 LAB — NOVEL CORONAVIRUS, NAA (HOSP ORDER, SEND-OUT TO REF LAB; TAT 18-24 HRS): SARS-CoV-2, NAA: NOT DETECTED

## 2019-05-30 NOTE — Progress Notes (Signed)

## 2019-05-30 NOTE — Progress Notes (Addendum)
Spoke w/ pt via phone for pre-op interview.  Npo after mn w/ exception clear liquids until 0700.  Arrive at 1100.  Needs cbc, t&s.  Pt had covid test done 05-29-2019.

## 2019-06-02 ENCOUNTER — Encounter (HOSPITAL_BASED_OUTPATIENT_CLINIC_OR_DEPARTMENT_OTHER): Payer: Self-pay

## 2019-06-02 ENCOUNTER — Ambulatory Visit (HOSPITAL_BASED_OUTPATIENT_CLINIC_OR_DEPARTMENT_OTHER)
Admission: RE | Admit: 2019-06-02 | Discharge: 2019-06-02 | Disposition: A | Discharge status: Home / Self Care | Payer: 59 | Attending: Obstetrics | Admitting: Obstetrics

## 2019-06-02 ENCOUNTER — Other Ambulatory Visit: Payer: Self-pay

## 2019-06-02 ENCOUNTER — Ambulatory Visit (HOSPITAL_COMMUNITY): Payer: 59

## 2019-06-02 ENCOUNTER — Encounter (HOSPITAL_BASED_OUTPATIENT_CLINIC_OR_DEPARTMENT_OTHER)
Admission: RE | Disposition: A | Discharge status: Home / Self Care | Payer: Self-pay | Source: Home / Self Care | Attending: Obstetrics

## 2019-06-02 ENCOUNTER — Ambulatory Visit (HOSPITAL_BASED_OUTPATIENT_CLINIC_OR_DEPARTMENT_OTHER): Payer: 59 | Admitting: Anesthesiology

## 2019-06-02 DIAGNOSIS — O020 Blighted ovum and nonhydatidiform mole: Secondary | ICD-10-CM

## 2019-06-02 DIAGNOSIS — F411 Generalized anxiety disorder: Secondary | ICD-10-CM | POA: Insufficient documentation

## 2019-06-02 DIAGNOSIS — F339 Major depressive disorder, recurrent, unspecified: Secondary | ICD-10-CM | POA: Diagnosis not present

## 2019-06-02 DIAGNOSIS — Q513 Bicornate uterus: Secondary | ICD-10-CM | POA: Diagnosis not present

## 2019-06-02 DIAGNOSIS — O021 Missed abortion: Secondary | ICD-10-CM | POA: Insufficient documentation

## 2019-06-02 DIAGNOSIS — Z9889 Other specified postprocedural states: Secondary | ICD-10-CM

## 2019-06-02 HISTORY — PX: DILATION AND EVACUATION: SHX1459

## 2019-06-02 HISTORY — DX: Presence of spectacles and contact lenses: Z97.3

## 2019-06-02 LAB — CBC
HCT: 39.5 % (ref 36.0–46.0)
Hemoglobin: 12.8 g/dL (ref 12.0–15.0)
MCH: 28.9 pg (ref 26.0–34.0)
MCHC: 32.4 g/dL (ref 30.0–36.0)
MCV: 89.2 fL (ref 80.0–100.0)
Platelets: 321 10*3/uL (ref 150–400)
RBC: 4.43 MIL/uL (ref 3.87–5.11)
RDW: 12.6 % (ref 11.5–15.5)
WBC: 7.2 10*3/uL (ref 4.0–10.5)
nRBC: 0 % (ref 0.0–0.2)

## 2019-06-02 LAB — PREPARE RBC (CROSSMATCH)

## 2019-06-02 LAB — ABO/RH: ABO/RH(D): O POS

## 2019-06-02 SURGERY — DILATION AND EVACUATION, UTERUS
Anesthesia: General | Site: Uterus

## 2019-06-02 MED ORDER — FENTANYL CITRATE (PF) 100 MCG/2ML IJ SOLN
INTRAMUSCULAR | Status: DC | PRN
  Administered 2019-06-02 (×2): 50 ug via INTRAVENOUS

## 2019-06-02 MED ORDER — ONDANSETRON HCL 4 MG/2ML IJ SOLN
4.0000 mg | Freq: Four times a day (QID) | INTRAMUSCULAR | Status: DC | PRN
  Filled 2019-06-02: qty 2

## 2019-06-02 MED ORDER — LIDOCAINE 2% (20 MG/ML) 5 ML SYRINGE
INTRAMUSCULAR | Status: DC | PRN
  Administered 2019-06-02: 100 mg via INTRAVENOUS

## 2019-06-02 MED ORDER — LIDOCAINE HCL 1 % IJ SOLN
INTRAMUSCULAR | Status: DC | PRN
  Administered 2019-06-02: 10 mL

## 2019-06-02 MED ORDER — ONDANSETRON HCL 4 MG/2ML IJ SOLN
INTRAMUSCULAR | Status: DC | PRN
  Administered 2019-06-02: 4 mg via INTRAVENOUS

## 2019-06-02 MED ORDER — OXYCODONE HCL 5 MG PO TABS
5.0000 mg | ORAL_TABLET | Freq: Once | ORAL | Status: DC | PRN
  Filled 2019-06-02: qty 1

## 2019-06-02 MED ORDER — DEXAMETHASONE SODIUM PHOSPHATE 10 MG/ML IJ SOLN
INTRAMUSCULAR | Status: DC | PRN
  Administered 2019-06-02: 10 mg via INTRAVENOUS

## 2019-06-02 MED ORDER — LACTATED RINGERS IV SOLN
INTRAVENOUS | Status: DC
  Filled 2019-06-02: qty 1000

## 2019-06-02 MED ORDER — MIDAZOLAM HCL 5 MG/5ML IJ SOLN
INTRAMUSCULAR | Status: DC | PRN
  Administered 2019-06-02: 2 mg via INTRAVENOUS

## 2019-06-02 MED ORDER — FENTANYL CITRATE (PF) 100 MCG/2ML IJ SOLN
25.0000 ug | INTRAMUSCULAR | Status: DC | PRN
  Filled 2019-06-02: qty 1

## 2019-06-02 MED ORDER — MIDAZOLAM HCL 2 MG/2ML IJ SOLN
INTRAMUSCULAR | Status: AC
  Filled 2019-06-02: qty 2

## 2019-06-02 MED ORDER — LIDOCAINE 2% (20 MG/ML) 5 ML SYRINGE
INTRAMUSCULAR | Status: AC
  Filled 2019-06-02: qty 5

## 2019-06-02 MED ORDER — PROPOFOL 10 MG/ML IV BOLUS
INTRAVENOUS | Status: DC | PRN
  Administered 2019-06-02: 160 mg via INTRAVENOUS

## 2019-06-02 MED ORDER — DOXYCYCLINE HYCLATE 100 MG IV SOLR
200.0000 mg | Freq: Once | INTRAVENOUS | Status: AC
  Administered 2019-06-02: 200 mg via INTRAVENOUS
  Filled 2019-06-02 (×2): qty 200

## 2019-06-02 MED ORDER — FENTANYL CITRATE (PF) 100 MCG/2ML IJ SOLN
INTRAMUSCULAR | Status: AC
  Filled 2019-06-02: qty 2

## 2019-06-02 MED ORDER — DEXAMETHASONE SODIUM PHOSPHATE 10 MG/ML IJ SOLN
INTRAMUSCULAR | Status: AC
  Filled 2019-06-02: qty 1

## 2019-06-02 MED ORDER — OXYCODONE HCL 5 MG/5ML PO SOLN
5.0000 mg | Freq: Once | ORAL | Status: DC | PRN
  Filled 2019-06-02: qty 5

## 2019-06-02 MED ORDER — IBUPROFEN 600 MG PO TABS: 600.0000 mg | ORAL_TABLET | Freq: Four times a day (QID) | ORAL | 0 refills | Status: DC | PRN

## 2019-06-02 MED ORDER — PROPOFOL 10 MG/ML IV BOLUS
INTRAVENOUS | Status: AC
  Filled 2019-06-02: qty 20

## 2019-06-02 MED ORDER — ONDANSETRON HCL 4 MG/2ML IJ SOLN
INTRAMUSCULAR | Status: AC
  Filled 2019-06-02: qty 2

## 2019-06-02 MED ORDER — LACTATED RINGERS IV SOLN
INTRAVENOUS | Status: DC
  Administered 2019-06-02: 125 mL/h via INTRAVENOUS
  Filled 2019-06-02: qty 1000

## 2019-06-02 SURGICAL SUPPLY — 12 items
CATH ROBINSON RED A/P 16FR (CATHETERS) ×2 IMPLANT
GLOVE BIOGEL PI IND STRL 7.0 (GLOVE) ×1 IMPLANT
GLOVE BIOGEL PI INDICATOR 7.0 (GLOVE) ×1
GLOVE ECLIPSE 6.0 STRL STRAW (GLOVE) ×2 IMPLANT
KIT BERKELEY 1ST TRIMESTER 3/8 (MISCELLANEOUS) ×2 IMPLANT
NS IRRIG 1000ML POUR BTL (IV SOLUTION) ×2 IMPLANT
PACK VAGINAL MINOR WOMEN LF (CUSTOM PROCEDURE TRAY) ×2 IMPLANT
PAD OB MATERNITY 4.3X12.25 (PERSONAL CARE ITEMS) ×2 IMPLANT
PAD PREP 24X48 CUFFED NSTRL (MISCELLANEOUS) ×2 IMPLANT
SET BERKELEY SUCTION TUBING (SUCTIONS) ×2 IMPLANT
TOWEL OR 17X24 6PK STRL BLUE (TOWEL DISPOSABLE) ×2 IMPLANT
VACURETTE 7MM CVD STRL WRAP (CANNULA) ×2 IMPLANT

## 2019-06-02 NOTE — Op Note (Signed)
Pre-Operative Diagnosis:  Missed abortion, possible partial molar pregnancy, bicornuate uterus  Postoperative Diagnosis: Missed abortion, possible partial molar pregnancy, bicornuate uterus  Procedure: Dilation and curettage under ultrasound guidance   Surgeon: Jerelyn Charles, MD  Operative Findings: Bicornuate uterus, anteverted, with pregnancy in the right horn.  Specimen: products of conception  EBL: 25cc    After adequate anesthesia was achieved, the patient placed in the dorsal lithotomy position in [place].  She was prepped and draped in the usual sterile fashion.  The bivalve speculum was placed in the vagina and the anterior lip of the cervix grasped with a single-tooth tenaculum.   10 cc of 1% lidocaine was administered in a paracervical fashion. The cervix was serially dilated with Hank dilators. . A 7 mm suction curette was advanced to the fundus under ultrasound guidance, the vacuum was engaged, and multiple suction passes were performed until the products of conception were evacuated. A Sharp curettage was performed and a gritty texture was noted. A transvaginal ultrasound was performed which showed a small area at the fundus concerning for clot versus residual pregnancy tissue.  The suction cannula was again advanced under ultrasound guidance.  Following an additional pass, the uterus was noted to be clear of pregnancy tissue on ultrasound.  Uniform crie was noted.  This completed the procedure.  All instruments were removed from the vagina.  Pressure was used to achieve hemostasis at the tenaculum site. The patient tolerated the procedure well was brought to the recovery room in stable condition for the procedure. All sponge and needle counts correct x2.   [place], [facility]

## 2019-06-02 NOTE — Anesthesia Procedure Notes (Signed)
Procedure Name: LMA Insertion Date/Time: 06/02/2019 12:58 PM Performed by: Breann Losano D, CRNA Pre-anesthesia Checklist: Patient identified, Emergency Drugs available, Suction available and Patient being monitored Patient Re-evaluated:Patient Re-evaluated prior to induction Oxygen Delivery Method: Circle system utilized Preoxygenation: Pre-oxygenation with 100% oxygen Induction Type: IV induction Ventilation: Mask ventilation without difficulty LMA: LMA inserted LMA Size: 3.0 Tube type: Oral Number of attempts: 1 Placement Confirmation: positive ETCO2 and breath sounds checked- equal and bilateral Tube secured with: Tape Dental Injury: Teeth and Oropharynx as per pre-operative assessment

## 2019-06-02 NOTE — Discharge Instructions (Signed)
Pelvic rest x 2 weeks (no intercourse or tampons).   Call your doctor if you have heavy vaginal bleeding (soaking through a pad an hour or more for >2 hours in a row), temperature >101F, severe nausea, vomiting, severe or worsening abdominal pain, dizziness, shortness of breath, chest pain or any other concerns.  Please take motrin every 6 hours.  Add tylenol 650 to 1000mg  every 6 hours if needed if your pain is not controlled by motrin alone.     Post Anesthesia Home Care Instructions  Activity: Get plenty of rest for the remainder of the day. A responsible adult should stay with you for 24 hours following the procedure.  For the next 24 hours, DO NOT: -Drive a car -Paediatric nurse -Drink alcoholic beverages -Take any medication unless instructed by your physician -Make any legal decisions or sign important papers.  Meals: Start with liquid foods such as gelatin or soup. Progress to regular foods as tolerated. Avoid greasy, spicy, heavy foods. If nausea and/or vomiting occur, drink only clear liquids until the nausea and/or vomiting subsides. Call your physician if vomiting continues.  Special Instructions/Symptoms: Your throat may feel dry or sore from the anesthesia or the breathing tube placed in your throat during surgery. If this causes discomfort, gargle with warm salt water. The discomfort should disappear within 24 hours.  If you had a scopolamine patch placed behind your ear for the management of post- operative nausea and/or vomiting:  1. The medication in the patch is effective for 72 hours, after which it should be removed.  Wrap patch in a tissue and discard in the trash. Wash hands thoroughly with soap and water. 2. You may remove the patch earlier than 72 hours if you experience unpleasant side effects which may include dry mouth, dizziness or visual disturbances. 3. Avoid touching the patch. Wash your hands with soap and water after contact with the patch.    Post  Anesthesia Home Care Instructions  Activity: Get plenty of rest for the remainder of the day. A responsible adult should stay with you for 24 hours following the procedure.  For the next 24 hours, DO NOT: -Drive a car -Paediatric nurse -Drink alcoholic beverages -Take any medication unless instructed by your physician -Make any legal decisions or sign important papers.  Meals: Start with liquid foods such as gelatin or soup. Progress to regular foods as tolerated. Avoid greasy, spicy, heavy foods. If nausea and/or vomiting occur, drink only clear liquids until the nausea and/or vomiting subsides. Call your physician if vomiting continues.  Special Instructions/Symptoms: Your throat may feel dry or sore from the anesthesia or the breathing tube placed in your throat during surgery. If this causes discomfort, gargle with warm salt water. The discomfort should disappear within 24 hours.  If you had a scopolamine patch placed behind your ear for the management of post- operative nausea and/or vomiting:  1. The medication in the patch is effective for 72 hours, after which it should be removed.  Wrap patch in a tissue and discard in the trash. Wash hands thoroughly with soap and water. 2. You may remove the patch earlier than 72 hours if you experience unpleasant side effects which may include dry mouth, dizziness or visual disturbances. 3. Avoid touching the patch. Wash your hands with soap and water after contact with the patch.

## 2019-06-02 NOTE — Transfer of Care (Signed)
Immediate Anesthesia Transfer of Care Note  Patient: Sandra Bauer  Procedure(s) Performed: DILATATION AND EVACUATION (N/A Uterus)  Patient Location: PACU  Anesthesia Type:General  Level of Consciousness: awake, alert  and oriented  Airway & Oxygen Therapy: Patient Spontanous Breathing and Patient connected to nasal cannula oxygen  Post-op Assessment: Report given to RN and Post -op Vital signs reviewed and stable  Post vital signs: Reviewed and stable  Last Vitals:  Vitals Value Taken Time  BP 135/86 06/02/19 1338  Temp    Pulse 114 06/02/19 1341  Resp 12 06/02/19 1341  SpO2 100 % 06/02/19 1341  Vitals shown include unvalidated device data.  Last Pain:  Vitals:   06/02/19 1141  TempSrc: Oral  PainSc: 0-No pain      Patients Stated Pain Goal: 4 (86/38/17 7116)  Complications: No apparent anesthesia complications

## 2019-06-02 NOTE — H&P (Signed)
33 y.o. [id number] [facility] and suspected [facility] pregnancy.  She was seen in the office for initial ultrasound on 05/15/2019.  At that time, an irregular gestational sac was seen.  Follow up scan on 05/29/2019 showed continued irregular sac, no fetal pole, and multiple cystic structures with increased vascular flow adjacent to the sac concerning for a partial mole.  The patient has a known bicornuate uterus.   Past Medical History:  Diagnosis Date  . Bicornate uterus    Renal u/s 11/2015 to eval for renal abnormalities: NORMAL  . GAD (generalized anxiety disorder)   . History of gestational diabetes 2017  . Missed ab   . Molar pregnancy   . Recurrent depression (HCC)   . SAB (spontaneous abortion) 10/2015  . Seasonal allergic rhinitis   . Wears contact lenses     Past Surgical History:  Procedure Laterality Date  . CESAREAN SECTION N/A 09/01/2016   Procedure: CESAREAN SECTION;  Surgeon: Marlow Baarsyanna Ashunti Schofield, MD;  [facility];  Service: Obstetrics;  Laterality: N/A;  . CESAREAN SECTION N/A 02/28/2018   Procedure: REPEAT CESAREAN SECTION;  Surgeon: Carrington ClampHorvath, Michelle, MD;  [facility];  Service: Obstetrics;  Laterality: N/A;  . DILATION AND EVACUATION N/A 10/27/2015   Procedure: DILATATION AND EVACUATION ULTRASOUND;  Surgeon: Marlow Baarsyanna Glady Ouderkirk, MD;  Location: [facility];  Service: Gynecology;  Laterality: N/A;  Ultrasound called on 10/26/15 by Donne Hazelhassity Stewart RN  . WISDOM TOOTH EXTRACTION      OB History  Gravida Para Term Preterm AB Living  3 2 1 1 1 2   SAB TAB Ectopic Multiple Live Births  1     0 2    # Outcome Date GA Lbr Len/2nd Weight Sex Delivery Anes PTL Lv  3 Preterm 02/28/18 847w3d  3200 g M CS-LTranv Spinal  LIV  2 Term 09/01/16 4657w3d  3155 g F CS-LTranv Spinal  LIV  1 SAB             Social History   Socioeconomic History  . Marital status: Married    Spouse name: Not on file  . Number of children: Not on file  . Years of  education: Not on file  . Highest education level: Not on file  Occupational History  . Not on file  Social Needs  . Financial resource strain: Not on file  . Food insecurity    Worry: Not on file    Inability: Not on file  . Transportation needs    Medical: Not on file    Non-medical: Not on file  Tobacco Use  . Smoking status: Never Smoker  . Smokeless tobacco: Never Used  Substance and Sexual Activity  . Alcohol use: No  . Drug use: Never  . Sexual activity: Yes    Birth control/protection: None  Social History Narrative   Married 2015.   Orig from [place], lives in [place], [place].   Has three younger sisters in [place].   Attending nursing school, works full time at [employer] in [place].   No T/A/Ds.   Nuvaring [etonogestrel-ethinyl estradiol]   Other PNC: uncomplicated.  ABO, Rh: --/--/PENDING, O POS Performed at [facility], [address] [place], [place] [address]  [id number](06/15 1126) Antibody: PENDING (06/15 1126)  Vitals:   06/02/19 1141  BP: 115/70  Pulse: 74  Resp: 16  Temp: 98.6 F (37 C)  SpO2: 100%       A/P   32 y.o.  [id number]  presents for dilation and curettage for missed abortion and suspected partial molar pregnancy. Hemoglobin on 05/29/2019 was 13.5.    Elects for surgical management.  Discussed risks to include infection, bleeding, damage to surrounding structures (including but not limited to vagina, cervix, bladder, uterus), uterine perforation, need for additional procedures.  All questions answered and patient elects to proceed.  Rh: positive  [employer]

## 2019-06-02 NOTE — Anesthesia Preprocedure Evaluation (Signed)
Anesthesia Evaluation  Patient identified by MRN, date of birth, ID band Patient awake    Reviewed: Allergy & Precautions, H&P , NPO status , Patient's Chart, lab work & pertinent test results  Airway Mallampati: II   Neck ROM: full    Dental   Pulmonary neg pulmonary ROS,    breath sounds clear to auscultation       Cardiovascular negative cardio ROS   Rhythm:regular Rate:Normal     Neuro/Psych PSYCHIATRIC DISORDERS Anxiety Depression    GI/Hepatic   Endo/Other    Renal/GU      Musculoskeletal   Abdominal   Peds  Hematology   Anesthesia Other Findings   Reproductive/Obstetrics                             Anesthesia Physical Anesthesia Plan  ASA: II  Anesthesia Plan: General   Post-op Pain Management:    Induction: Intravenous  PONV Risk Score and Plan: 3 and Ondansetron, Dexamethasone, Midazolam and Treatment may vary due to age or medical condition  Airway Management Planned: LMA  Additional Equipment:   Intra-op Plan:   Post-operative Plan:   Informed Consent: I have reviewed the patients History and Physical, chart, labs and discussed the procedure including the risks, benefits and alternatives for the proposed anesthesia with the patient or authorized representative who has indicated his/her understanding and acceptance.       Plan Discussed with: CRNA, Anesthesiologist and Surgeon  Anesthesia Plan Comments:         Anesthesia Quick Evaluation

## 2019-06-03 ENCOUNTER — Encounter (HOSPITAL_BASED_OUTPATIENT_CLINIC_OR_DEPARTMENT_OTHER): Payer: Self-pay | Admitting: Obstetrics

## 2019-06-03 NOTE — Anesthesia Postprocedure Evaluation (Signed)
Anesthesia Post Note  Patient: Sandra Bauer  Procedure(s) Performed: DILATATION AND EVACUATION (N/A Uterus)     Patient location during evaluation: PACU Anesthesia Type: General Level of consciousness: awake and alert Pain management: pain level controlled Vital Signs Assessment: post-procedure vital signs reviewed and stable Respiratory status: spontaneous breathing, nonlabored ventilation, respiratory function stable and patient connected to nasal cannula oxygen Cardiovascular status: blood pressure returned to baseline and stable Postop Assessment: no apparent nausea or vomiting Anesthetic complications: no    Last Vitals:  Vitals:   06/02/19 1415 06/02/19 1500  BP: 119/83 106/61  Pulse: 69 71  Resp: 12 12  Temp:  37.4 C  SpO2: 100% 100%    Last Pain:  Vitals:   06/02/19 1500  TempSrc:   PainSc: [place]

## 2019-06-06 LAB — BPAM RBC
Blood Product Expiration Date: 202007012359
Blood Product Expiration Date: 202007052359
Unit Type and Rh: 5100
Unit Type and Rh: 5100

## 2019-06-06 LAB — TYPE AND SCREEN
ABO/RH(D): O POS
Antibody Screen: NEGATIVE
Unit division: 0
Unit division: 0

## 2019-07-10 DIAGNOSIS — O020 Blighted ovum and nonhydatidiform mole: Secondary | ICD-10-CM | POA: Insufficient documentation

## 2019-08-02 ENCOUNTER — Telehealth (INDEPENDENT_AMBULATORY_CARE_PROVIDER_SITE_OTHER): Payer: 59 | Admitting: Family Medicine

## 2019-08-02 ENCOUNTER — Other Ambulatory Visit: Payer: Self-pay

## 2019-08-02 DIAGNOSIS — Z20822 Contact with and (suspected) exposure to covid-19: Secondary | ICD-10-CM

## 2019-08-02 DIAGNOSIS — Z20828 Contact with and (suspected) exposure to other viral communicable diseases: Secondary | ICD-10-CM | POA: Diagnosis not present

## 2019-08-02 NOTE — Progress Notes (Signed)
This visit type was conducted due to national recommendations for restrictions regarding the COVID-19 pandemic in an effort to limit this patient's exposure and mitigate transmission in our community.   Virtual Visit via Video Note  I connected with Sandra Bauer on 08/02/19 at 10:20 AM EDT by a video enabled telemedicine application and verified that I am speaking with the correct person using two identifiers.  Location patient: home Location provider:work or home office Persons participating in the virtual visit: patient, provider  I discussed the limitations of evaluation and management by telemedicine and the availability of in person appointments. The patient expressed understanding and agreed to proceed.   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  They have been wearing mask consistently and using good hand hygiene.  Patient was supposed to go to work later today but she did awake up this morning with some sore throat mild nasal congestion.  She thinks this could be allergy related.  She has not had any fever, dyspnea, cough, diarrhea, headache, body aches, or any loss of taste or smell.  She lives with her husband and 2 children and they are all asymptomatic.  Patient does not have any chronic lung issues   ROS: See pertinent positives and negatives per HPI.  Past Medical History:  Diagnosis Date  . Bicornate uterus    Renal u/s 11/2015 to eval for renal abnormalities: NORMAL  . GAD (generalized anxiety disorder)   . History of gestational diabetes 2017  . Missed ab   . Molar pregnancy   . Recurrent depression (HCC)   . SAB (spontaneous abortion) 10/2015; 05/2019   2020->spont abort possible partial molar pregnancy  . Seasonal allergic rhinitis   . Wears contact lenses     Past Surgical History:  Procedure Laterality Date  . CESAREAN SECTION N/A 09/01/2016   Procedure: CESAREAN SECTION;  Surgeon: Marlow Baarsyanna Clark, MD;  [facility];  Service: Obstetrics;  Laterality: N/A;  . CESAREAN SECTION N/A 02/28/2018   Procedure: REPEAT CESAREAN SECTION;  Surgeon: Carrington ClampHorvath, Michelle, MD;  [facility];  Service: Obstetrics;  Laterality: N/A;  . DILATION AND EVACUATION N/A 10/27/2015   Procedure: DILATATION AND EVACUATION ULTRASOUND;  Surgeon: Marlow Baarsyanna Clark, MD;  Location[facility];  Service: Gynecology;  Laterality: N/A;  Ultrasound called on 10/26/15 by Donne Hazelhassity Stewart RN  . DILATION AND EVACUATION N/A 06/02/2019   Procedure: DILATATION AND EVACUATION;  Surgeon: Marlow Baarslark, [name], MD;  [facility];  Service: Gynecology;  Laterality: N/A;  . WISDOM TOOTH EXTRACTION      Family History  Problem Relation Age of Onset  . Cancer Maternal Grandfather        "stomach"  . Diabetes Paternal Grandfather   . Stroke Paternal Grandfather     SOCIAL HX: Non-smoker   Current Outpatient Medications:  .  clonazePAM (KLONOPIN) 0.5 MG tablet, Take 1 tablet (0.5 mg total) by mouth 2 (two) times daily as needed for anxiety. (Patient not taking: Reported on 04/30/2019), Disp: 60 tablet, Rfl: 1 .  DULoxetine (CYMBALTA) 30 MG capsule, 1 cap po qd x 7d, then 1 cap po qd x 10 doses (Patient not taking: Reported on 05/23/2019), Disp: 17 capsule, Rfl: 0 .  ibuprofen (ADVIL) 600 MG tablet, Take 1 tablet (600 mg total) by mouth every 6 (six) hours as needed., Disp: 30 tablet, Rfl: 0 .  ondansetron (ZOFRAN) 8 MG tablet, 1 tab po tid prn nausea (Patient not  taking: Reported on 05/23/2019), Disp: 30 tablet, Rfl: 1 .  Prenatal Vit-Fe Fumarate-FA (PRENATAL MULTIVITAMIN) TABS tablet, Take 1 tablet by mouth at bedtime., Disp: , Rfl:   EXAM:  VITALS per patient if applicable:  GENERAL: alert, oriented, appears well and in no acute distress  HEENT: atraumatic, conjunttiva clear, no obvious abnormalities on inspection of external nose and ears  NECK: normal movements of the head and neck  LUNGS: on inspection no  signs of respiratory distress, breathing rate appears normal, no obvious gross SOB, gasping or wheezing  CV: no obvious cyanosis  MS: moves all visible extremities without noticeable abnormality  PSYCH/NEURO: pleasant and cooperative, no obvious depression or anxiety, speech and thought processing grossly intact  ASSESSMENT AND PLAN:  Discussed the following assessment and plan:  Close Exposure to Covid-19 Virus - Plan: Novel Coronavirus, NAA ([facility])-patient has only mild symptoms currently including sore throat mild nasal congestion and these may be allergy related.  -She would like to go ahead with coronavirus testing.  Future order placed for Monday -She will stay quarantined in the meantime and plenty of fluids and rest -Follow-up for any increased dyspnea or other concerns    I discussed the assessment and treatment plan with the patient. The patient was provided an opportunity to ask questions and all were answered. The patient agreed with the plan and demonstrated an understanding of the instructions.   The patient was advised to call back or seek an in-person evaluation if the symptoms worsen or if the condition fails to improve as anticipated.    Carolann Littler, MD

## 2019-08-04 ENCOUNTER — Telehealth: Payer: Self-pay

## 2019-08-04 NOTE — Telephone Encounter (Signed)
[place] Night - Client TELEPHONE ADVICE RECORD [facility] Patient Name: Sandra Bauer Gender: Female DOB: [birth year] Age: 33 Y 103 M 20 D Return Phone Number: [phone] (Primary), [phone] (Secondary) Address: City/State/Zip: [place] [address] Client [facility] [place] Night - Client Client Site [place] Night Physician Crissie Sickles - MD Contact Type Call Who Is Calling Patient / Member / Family / Caregiver Call Type Triage / Clinical Relationship To Patient Self Return Phone Number [phone] (Primary) Chief Complaint Sore Throat Reason for Call Symptomatic / Request for [place] states she was exposed to covid-19. Patient reports sore throat, chills, and runny nose. [place] Not Listed sat clinic Translation No Nurse Assessment Nurse: Rosemarie Beath, RN, Leah Date/Time (Eastern Time): 08/02/2019 9:37:14 AM Confirm and document reason for call. If symptomatic, describe symptoms. ---Caller states she was exposed to covid-19 at work. She was told yesterday by her employer that she worked with that person 3 days ago. Patient reports sore throat, chills, and runny nose. T 99.7(forehead). Has the patient had close contact with a person known or suspected to have the novel coronavirus illness OR traveled / lives in area with major community spread (including international travel) in the last 14 days from the onset of symptoms? * If Asymptomatic, screen for exposure and travel within the last 14 days. ---Yes Does the patient have any new or worsening symptoms? ---Yes Will a triage be completed? ---Yes Related visit to physician within the last 2 weeks? ---No Does the PT have any chronic conditions? (i.e. diabetes, asthma, this includes High risk factors for pregnancy, etc.) ---No Is the patient pregnant or possibly pregnant? (Ask all females between the ages of 29-55) ---No Is this  a behavioral health or substance abuse call? ---No Guidelines Guideline Title Affirmed Question Affirmed Notes Nurse Date/Time (Eastern Time) Coronavirus (COVID-19) - Diagnosed or Suspected [1] COVID-19 infection suspected by caller or triager AND [2] mild symptoms (cough, fever, Leming, RN, Leah 08/02/2019 9:39:03 AM PLEASE NOTE: All timestamps contained within this report are represented as [place] Standard Time. CONFIDENTIALTY NOTICE: This fax transmission is intended only for the addressee. It contains information that is legally privileged, confidential or otherwise protected from use or disclosure. If you are not the intended recipient, you are strictly prohibited from reviewing, disclosing, copying using or disseminating any of this information or taking any action in reliance on or regarding this information. If you have received this fax in error, please notify us immediately by telephone so that we can arrange for its return to [place]. Phone: [phone], Toll-Free: [phone], Fax: [phone] Page: 2 of 2 Call Id: [id number] Guidelines Guideline Title Affirmed Question Affirmed Notes Nurse Date/Time Eilene Ghazi Time) or others) AND [5] no complications or SOB Disp. Time Eilene Ghazi Time) Disposition Final User 08/02/2019 9:41:46 AM Call PCP when Office is Open Yes [place], RN, Denny Peon Caller Disagree/Comply Comply Caller Understands Yes PreDisposition Call Doctor Care Advice Given Per Guideline REASSURANCE AND EDUCATION - SUSPECTED COVID-19: * You suspect you have COVID-19 because you have symptoms that match and you were either exposed to someone with it or because it widespread in your community. * Most people who get COVID-19 will have mild illness, can recover at home without medical care, and may not need to be tested. From what you have told me, your symptoms are mild. That is reassuring. * Talk with your healthcare provider about your symptoms. Ask if testing is needed. CALL  BACK IF: *  Fever lasts over 3 days * Chest pain or difficulty breathing occurs * You become worse. CARE ADVICE given per CORONAVIRUS (COVID-19) - DIAGNOSED OR SUSPECTED (Adult) guideline. Comments User: Rozann LeschesLeah, Leming, RN Date/Time Lamount Cohen(Eastern Time): 08/02/2019 9:47:23 AM [phone] warm transferred to the office for testing information Referrals GO TO FACILITY OTHER - SPECIFY

## 2019-09-30 ENCOUNTER — Other Ambulatory Visit: Payer: Self-pay

## 2019-09-30 ENCOUNTER — Encounter: Payer: Self-pay | Admitting: Family Medicine

## 2019-09-30 ENCOUNTER — Ambulatory Visit (INDEPENDENT_AMBULATORY_CARE_PROVIDER_SITE_OTHER): Payer: 59 | Admitting: Family Medicine

## 2019-09-30 VITALS — BP 100/62 | HR 81 | Temp 98.7°F | Resp 16 | Ht 61.0 in | Wt 179.0 lb

## 2019-09-30 DIAGNOSIS — N3000 Acute cystitis without hematuria: Secondary | ICD-10-CM | POA: Diagnosis not present

## 2019-09-30 DIAGNOSIS — R350 Frequency of micturition: Secondary | ICD-10-CM

## 2019-09-30 LAB — POC URINALSYSI DIPSTICK (AUTOMATED)
Bilirubin, UA: NEGATIVE
Blood, UA: 10
Glucose, UA: NEGATIVE
Ketones, UA: NEGATIVE
Leukocytes, UA: NEGATIVE
Nitrite, UA: NEGATIVE
Protein, UA: NEGATIVE
Spec Grav, UA: 1.025 (ref 1.010–1.025)
Urobilinogen, UA: 0.2 E.U./dL
pH, UA: 6 (ref 5.0–8.0)

## 2019-09-30 MED ORDER — CEFDINIR 300 MG PO CAPS: ORAL_CAPSULE | ORAL | 0 refills | Status: DC

## 2019-09-30 MED ORDER — IBUPROFEN 600 MG PO TABS: 600.0000 mg | ORAL_TABLET | Freq: Four times a day (QID) | ORAL | 0 refills | Status: DC | PRN

## 2019-09-30 NOTE — Progress Notes (Signed)
OFFICE VISIT  09/30/2019   CC:  Chief Complaint  Patient presents with  . Back Pain  . Urinary Frequency    dark and cloudy   HPI:    Patient is a 33 y.o.  female who presents for urinary complaints. Intermittent dysuria, onset about 4 d/a.  Started with left lower back pain, constant ache. Has some increased frequency but this is likely due to her increased ingestion of fluids.  No urgency. Urine was darker, cloudy when sx's started but this improved with increased intake of water. Pain in back now more diffuse in all lower back and sometimes extends up to center mid back. No rad down legs or paresthesias. No fever.  +Malaise.  Some nausea yesterday.  Not sexually active.  Hx of molar pregnacy, has been getting bHCG's followed and recent testing showed them very low--c/w NOT being pregnant.  ibup for pain.  Past Medical History:  Diagnosis Date  . Bicornate uterus    Renal u/s 11/2015 to eval for renal abnormalities: NORMAL  . GAD (generalized anxiety disorder)   . History of gestational diabetes 2017  . Missed ab   . Molar pregnancy   . Recurrent depression ([place])   . SAB (spontaneous abortion) 10/2015; 05/2019   2020->spont abort possible partial molar pregnancy  . Seasonal allergic rhinitis   . Wears contact lenses     Past Surgical History:  Procedure Laterality Date  . CESAREAN SECTION N/A 09/01/2016   Procedure: CESAREAN SECTION;  Surgeon: Jerelyn Charles, MD;  Location: [facility];  Service: Obstetrics;  Laterality: N/A;  . CESAREAN SECTION N/A 02/28/2018   Procedure: REPEAT CESAREAN SECTION;  Surgeon: Bobbye Charleston, MD;  Location: [facility] [facility];  Service: Obstetrics;  Laterality: N/A;  . DILATION AND EVACUATION N/A 10/27/2015   Procedure: DILATATION AND EVACUATION ULTRASOUND;  Surgeon: Jerelyn Charles, MD;  Location: [facility];  Service: Gynecology;  Laterality: N/A;  Ultrasound called on 10/26/15 by Vernona Rieger RN  . DILATION AND EVACUATION N/A  06/02/2019   Procedure: DILATATION AND EVACUATION;  Surgeon: Jerelyn Charles, MD;  Location: [facility];  Service: Gynecology;  Laterality: N/A;  . WISDOM TOOTH EXTRACTION      Outpatient Medications Prior to Visit  Medication Sig Dispense Refill  . Ascorbic Acid (VITAMIN C ADULT GUMMIES PO) Take by mouth.    . ELDERBERRY PO Take by mouth.    Marland Kitchen ibuprofen (ADVIL) 600 MG tablet Take 1 tablet (600 mg total) by mouth every 6 (six) hours as needed. 30 tablet 0  . ondansetron (ZOFRAN) 8 MG tablet 1 tab po tid prn nausea (Patient not taking: Reported on 05/23/2019) 30 tablet 1  . Prenatal Vit-Fe Fumarate-FA (PRENATAL MULTIVITAMIN) TABS tablet Take 1 tablet by mouth at bedtime.    . clonazePAM (KLONOPIN) 0.5 MG tablet Take 1 tablet (0.5 mg total) by mouth 2 (two) times daily as needed for anxiety. (Patient not taking: Reported on 04/30/2019) 60 tablet 1  . DULoxetine (CYMBALTA) 30 MG capsule 1 cap po qd x 7d, then 1 cap po qd x 10 doses (Patient not taking: Reported on 05/23/2019) 17 capsule 0   No facility-administered medications prior to visit.     Allergies  Allergen Reactions  . Nuvaring [Etonogestrel-Ethinyl Estradiol]     Vaginal burning, HA, bloating, mood change, nose bleed    ROS As per HPI  PE: Blood pressure 100/62, pulse 81, temperature 98.7 F (37.1 C), temperature source Temporal, resp. rate 16, height 5\' 1"  (1.549 m),  weight 179 lb (81.2 kg), SpO2 99 %, not currently breastfeeding. Gen: Alert, tired appearing but non-toxic/nad.  Patient is oriented to person, place, time, and situation. AFFECT: pleasant, lucid thought and speech. CVA TTP on left, but also TTP in L lumbar region extending around left side some. No right low back or midline back tenderness. EXT: no clubbing or cyanosis.  no edema.   LABS:    Chemistry      Component Value Date/Time   NA 136 08/31/2016 1150   K 3.7 08/31/2016 1150   CL 110 08/31/2016 1150   CO2 19 (L) 08/31/2016 1150   BUN 10  08/31/2016 1150   CREATININE 0.48 08/31/2016 1150      Component Value Date/Time   CALCIUM 8.9 08/31/2016 1150   ALKPHOS 68 02/20/2012 0941   AST 22 02/20/2012 0941   ALT 22 02/20/2012 0941   BILITOT 0.2 (L) 02/20/2012 0941     Lab Results  Component Value Date   WBC 7.2 06/02/2019   HGB 12.8 06/02/2019   HCT 39.5 06/02/2019   MCV 89.2 06/02/2019   PLT 321 06/02/2019   Lab Results  Component Value Date   HGBA1C 5.3 12/09/2018   POC CC UA today: all normal.  IMPRESSION AND PLAN:  UTI suspected: even with her UA normal-->clinical picture c/w urinary infection.  Urine sent for c/s. Very low suspicion of stone. Continue good hydration. Cefdinir 300 mg bid x 7d. Ibup 600mg  qid prn with food--this has been helping. She has zofran at home if she needs it.  An After Visit Summary was printed and given to the patient.  FOLLOW UP: Return if symptoms worsen or fail to improve.  Signed:  , MD           09/30/2019

## 2019-10-02 LAB — URINE CULTURE
MICRO NUMBER:: 984410
Result:: NO GROWTH
SPECIMEN QUALITY:: ADEQUATE

## 2019-10-07 ENCOUNTER — Other Ambulatory Visit: Payer: Self-pay | Admitting: Family Medicine

## 2019-10-07 NOTE — Telephone Encounter (Signed)
RF request for Cymbalta LOV: 05/23/2019 GAD and 09/30/2019 acute Next ov: not scheduled Last written: 02/19/2019  I read your last note and you said it was okay for patient to restart medication. I was unsure if I could call that medication in for patient. Please advise. Thank you

## 2019-10-07 NOTE — Telephone Encounter (Signed)
Yes, pls eRx duloxetine 30 mg, 1 tab po qd x 15d, then increase to 2 caps po qd, #45, no RF. F/u in office 3-4 wks.-thx

## 2019-10-07 NOTE — Telephone Encounter (Signed)
Patient requests to go back on her anxiety medicine. Cymbalta She uses [facility] in [place], if Dr. Anitra Lauth call in meds.  Please contact patient if needed or she needs an appt. [phone]  Thank you

## 2019-10-07 NOTE — Telephone Encounter (Signed)
Sent patient a mychart message letting her know the status of her medication

## 2019-10-08 ENCOUNTER — Other Ambulatory Visit: Payer: Self-pay

## 2019-10-08 MED ORDER — DULOXETINE HCL 30 MG PO CPEP: ORAL_CAPSULE | ORAL | 0 refills | Status: DC

## 2019-10-08 NOTE — Telephone Encounter (Signed)
Rx sent in, pt was notified. F/u appt made for 10/29/19.

## 2019-10-29 ENCOUNTER — Ambulatory Visit: Payer: PRIVATE HEALTH INSURANCE | Admitting: Family Medicine

## 2020-04-26 ENCOUNTER — Inpatient Hospital Stay (HOSPITAL_COMMUNITY)
Admission: AD | Admit: 2020-04-26 | Discharge: 2020-04-26 | Disposition: A | Discharge status: Home / Self Care | Payer: 59 | Attending: Obstetrics and Gynecology | Admitting: Obstetrics and Gynecology

## 2020-04-26 ENCOUNTER — Other Ambulatory Visit: Payer: Self-pay

## 2020-04-26 ENCOUNTER — Inpatient Hospital Stay (HOSPITAL_COMMUNITY): Payer: 59

## 2020-04-26 ENCOUNTER — Encounter (HOSPITAL_COMMUNITY): Payer: Self-pay | Admitting: Obstetrics and Gynecology

## 2020-04-26 DIAGNOSIS — O021 Missed abortion: Secondary | ICD-10-CM | POA: Diagnosis not present

## 2020-04-26 DIAGNOSIS — Z3A11 11 weeks gestation of pregnancy: Secondary | ICD-10-CM | POA: Diagnosis not present

## 2020-04-26 DIAGNOSIS — O209 Hemorrhage in early pregnancy, unspecified: Secondary | ICD-10-CM | POA: Diagnosis present

## 2020-04-26 DIAGNOSIS — O039 Complete or unspecified spontaneous abortion without complication: Secondary | ICD-10-CM | POA: Insufficient documentation

## 2020-04-26 LAB — CBC
HCT: 40.5 % (ref 36.0–46.0)
Hemoglobin: 13.2 g/dL (ref 12.0–15.0)
MCH: 28.4 pg (ref 26.0–34.0)
MCHC: 32.6 g/dL (ref 30.0–36.0)
MCV: 87.3 fL (ref 80.0–100.0)
Platelets: 306 10*3/uL (ref 150–400)
RBC: 4.64 MIL/uL (ref 3.87–5.11)
RDW: 12.4 % (ref 11.5–15.5)
WBC: 8 10*3/uL (ref 4.0–10.5)
nRBC: 0 % (ref 0.0–0.2)

## 2020-04-26 LAB — URINALYSIS, ROUTINE W REFLEX MICROSCOPIC
Bilirubin Urine: NEGATIVE
Glucose, UA: NEGATIVE mg/dL
Ketones, ur: 5 mg/dL — AB
Leukocytes,Ua: NEGATIVE
Nitrite: NEGATIVE
Protein, ur: NEGATIVE mg/dL
Specific Gravity, Urine: 1.006 (ref 1.005–1.030)
pH: 6 (ref 5.0–8.0)

## 2020-04-26 LAB — WET PREP, GENITAL
Clue Cells Wet Prep HPF POC: NONE SEEN
Sperm: NONE SEEN
Trich, Wet Prep: NONE SEEN
Yeast Wet Prep HPF POC: NONE SEEN

## 2020-04-26 LAB — POCT PREGNANCY, URINE: Preg Test, Ur: POSITIVE — AB

## 2020-04-26 LAB — HCG, QUANTITATIVE, PREGNANCY: hCG, Beta Chain, Quant, S: 8145 m[IU]/mL — ABNORMAL HIGH (ref <5)

## 2020-04-26 NOTE — MAU Provider Note (Signed)
Chief Complaint: Abdominal Cramping, Back Pain, and Vaginal Bleeding   None     SUBJECTIVE HPI: Sandra Bauer is a 34 y.o. [id number] at [redacted]w[redacted]d by LMP and confirmed by 8 week [place] who presents to maternity admissions reporting light vaginal bleeding with onset today.  She reports she saw bright red bleeding when wiping x 1 today, then darker bleeding 2 other times. The bleeding has required a pantyliner but not a pad.  There is associated abdominal cramping and back pain.  She has not tried any treatments.  She denies vaginal bleeding, vaginal itching/burning, urinary symptoms, h/a, dizziness, n/v, or fever/chills.     HPI  Past Medical History:  Diagnosis Date  . Bicornate uterus    Renal u/s 11/2015 to eval for renal abnormalities: NORMAL  . GAD (generalized anxiety disorder)   . History of gestational diabetes 2017  . Missed ab   . Molar pregnancy   . Recurrent depression (HCC)   . SAB (spontaneous abortion) 10/2015; 05/2019   2020->spont abort possible partial molar pregnancy  . Seasonal allergic rhinitis   . Wears contact lenses    Past Surgical History:  Procedure Laterality Date  . CESAREAN SECTION N/A 09/01/2016   Procedure: CESAREAN SECTION;  Surgeon: Marlow Baars, MD;  Location: [facility] BIRTHING SUITES;  Service: Obstetrics;  Laterality: N/A;  . CESAREAN SECTION N/A 02/28/2018   Procedure: REPEAT CESAREAN SECTION;  Surgeon: [name], MD;  Location: [facility] BIRTHING SUITES;  Service: Obstetrics;  Laterality: N/A;  . DILATION AND EVACUATION N/A 10/27/2015   Procedure: DILATATION AND EVACUATION ULTRASOUND;  Surgeon: Marlow Baars, MD;  Location: [facility];  Service: Gynecology;  Laterality: N/A;  Ultrasound called on 10/26/15 by Donne Hazel RN  . DILATION AND EVACUATION N/A 06/02/2019   Procedure: DILATATION AND EVACUATION;  Surgeon: Marlow Baars, MD;  Location: [facility];  Service: Gynecology;  Laterality: N/A;  . WISDOM TOOTH EXTRACTION     Social History    Socioeconomic History  . Marital status: Married    Spouse name: Not on file  . Number of children: Not on file  . Years of education: Not on file  . Highest education level: Not on file  Occupational History  . Not on file  Tobacco Use  . Smoking status: Never Smoker  . Smokeless tobacco: Never Used  Substance and Sexual Activity  . Alcohol use: No  . Drug use: Never  . Sexual activity: Yes    Birth control/protection: None  Other Topics Concern  . Not on file  Social History Narrative   Married 2015.   Orig from [place], lives in [place], [place].   Has three younger sisters in [place].   Attending nursing school, works full time at [employer] in [place].   No T/A/Ds.   Social Determinants of Health   Financial Resource Strain:   . Difficulty of Paying Living Expenses:   Food Insecurity:   . Worried About Programme researcher, broadcasting/film/video in the Last Year:   . Barista in the Last Year:   Transportation Needs:   . Freight forwarder (Medical):   Marland Kitchen Lack of Transportation (Non-Medical):   Physical Activity:   . Days of Exercise per Week:   . Minutes of Exercise per Session:   Stress:   . Feeling of Stress :   Social Connections:   . Frequency of Communication with Friends and Family:   . Frequency of Social Gatherings with Friends and Family:   . Attends  Religious Services:   . Active Member of Clubs or Organizations:   . Attends Archivist Meetings:   Marland Kitchen Marital Status:   Intimate Partner Violence:   . Fear of Current or Ex-Partner:   . Emotionally Abused:   Marland Kitchen Physically Abused:   . Sexually Abused:    No current facility-administered medications on file prior to encounter.   Current Outpatient Medications on File Prior to Encounter  Medication Sig Dispense Refill  . Ascorbic Acid (VITAMIN C ADULT GUMMIES PO) Take by mouth.    . ELDERBERRY PO Take by mouth.    . Prenatal Vit-Fe Fumarate-FA (PRENATAL MULTIVITAMIN) TABS tablet Take 1 tablet by mouth at bedtime.     . cefdinir (OMNICEF) 300 MG capsule 1 cap po bid 14 capsule 0  . DULoxetine (CYMBALTA) 30 MG capsule Take 1 capsule by mouth daily for 15 days, then increase to 2 capsules daily. 45 capsule 0  . ondansetron (ZOFRAN) 8 MG tablet 1 tab po tid prn nausea (Patient not taking: Reported on 05/23/2019) 30 tablet 1   Allergies  Allergen Reactions  . Nuvaring [Etonogestrel-Ethinyl Estradiol]     Vaginal burning, HA, bloating, mood change, nose bleed    ROS:  Review of Systems  Constitutional: Negative for chills and fever.  Respiratory: Negative for cough and shortness of breath.   Cardiovascular: Negative for chest pain.  Gastrointestinal: Negative for nausea and vomiting.  Genitourinary: Positive for pelvic pain and vaginal bleeding. Negative for dysuria, frequency and urgency.  Musculoskeletal: Negative.   Neurological: Negative for dizziness and headaches.     I have reviewed patient's Past Medical Hx, Surgical Hx, Family Hx, Social Hx, medications and allergies.   Physical Exam   Patient Vitals for the past 24 hrs:  BP Pulse Resp SpO2  04/26/20 1914 133/86 (!) 102 16 100 %   Constitutional: Well-developed, well-nourished female in no acute distress.  Cardiovascular: normal rate Respiratory: normal effort GI: Abd soft, non-tender. Pos BS x 4 MS: Extremities nontender, no edema, normal ROM Neurologic: Alert and oriented x 4.  GU: Neg CVAT.  PELVIC EXAM: Cervix pink, visually closed, without lesion, scant light red bleeding, no fox swabs needed to visualize cervix, vaginal walls and external genitalia normal Bimanual exam: Cervix 0/long/high, firm, anterior, neg CMT, uterus nontender,slightly enlarged, adnexa without tenderness, enlargement, or mass  Unable to obtain FHT by doppler  LAB RESULTS  --/--/O POS, O POS Performed at [facility], [place] [address] [place], [place] [address]  [phone]) Results for orders placed or performed during the hospital  encounter of 04/26/20 (from the past 48 hour(s))  Urinalysis, Routine w reflex microscopic     Status: Abnormal   Collection Time: 04/26/20  3:07 PM  Result Value Ref Range   Color, Urine STRAW (A) YELLOW   APPearance CLEAR CLEAR   Specific Gravity, Urine 1.006 1.005 - 1.030   pH 6.0 5.0 - 8.0   Glucose, UA NEGATIVE NEGATIVE mg/dL   Hgb urine dipstick LARGE (A) NEGATIVE   Bilirubin Urine NEGATIVE NEGATIVE   Ketones, ur 5 (A) NEGATIVE mg/dL   Protein, ur NEGATIVE NEGATIVE mg/dL   Nitrite NEGATIVE NEGATIVE   Leukocytes,Ua NEGATIVE NEGATIVE   RBC / HPF 11-20 0 - 5 RBC/hpf   WBC, UA 0-5 0 - 5 WBC/hpf   Bacteria, UA RARE (A) NONE SEEN   Squamous Epithelial / LPF 0-5 0 - 5    Comment: Performed at [facility], [address],  [place], [place] [address]  Pregnancy, urine POC     Status: Abnormal   Collection Time: 04/26/20  3:20 PM  Result Value Ref Range   Preg Test, Ur POSITIVE (A) NEGATIVE    Comment:        THE SENSITIVITY OF THIS METHODOLOGY IS >24 mIU/mL   GC/Chlamydia probe amp ([place])not at [facility]     Status: None   Collection Time: 04/26/20  4:04 PM  Result Value Ref Range   Neisseria Gonorrhea Negative    Chlamydia Negative    Comment Normal Reference Ranger Chlamydia - Negative    Comment      Normal Reference Range Neisseria Gonorrhea - Negative  Wet prep, genital     Status: Abnormal   Collection Time: 04/26/20  4:24 PM   Specimen: Cervix  Result Value Ref Range   Yeast Wet Prep HPF POC NONE SEEN NONE SEEN   Trich, Wet Prep NONE SEEN NONE SEEN   Clue Cells Wet Prep HPF POC NONE SEEN NONE SEEN   WBC, Wet Prep HPF POC MANY (A) NONE SEEN   Sperm NONE SEEN     Comment: Performed at [facility], [address], [place], [place] [address]  CBC     Status: None   Collection Time: 04/26/20  4:46 PM  Result Value Ref Range   WBC 8.0 4.0 - 10.5 K/uL   RBC 4.64 3.87 - 5.11 MIL/uL   Hemoglobin 13.2 12.0 - 15.0 g/dL   HCT 26.9 48.5 - 46.2 %   MCV 87.3  80.0 - 100.0 fL   MCH 28.4 26.0 - 34.0 pg   MCHC 32.6 30.0 - 36.0 g/dL   RDW 70.3 50.0 - 93.8 %   Platelets 306 150 - 400 K/uL   nRBC 0.0 0.0 - 0.2 %    Comment: Performed at [facility], [address], [place], [place] [address]  hCG, quantitative, pregnancy     Status: Abnormal   Collection Time: 04/26/20  4:46 PM  Result Value Ref Range   hCG, Beta Chain, Quant, S 8,145 (H) <5 mIU/mL    Comment:          GEST. AGE      CONC.  (mIU/mL)   <=1 WEEK        5 - 50     2 WEEKS       50 - 500     3 WEEKS       100 - 10,000     4 WEEKS     1,000 - 30,000     5 WEEKS     3,500 - 115,000   6-8 WEEKS     12,000 - 270,000    12 WEEKS     15,000 - 220,000        FEMALE AND NON-PREGNANT FEMALE:     LESS THAN 5 mIU/mL Performed at [facility], [address] [place], [place] [address]    IMAGING US OB LESS THAN 14 WEEKS WITH OB TRANSVAGINAL  Result Date: 04/26/2020 CLINICAL DATA:  Bleeding and pain EXAM: OBSTETRIC <14 WK [place] AND TRANSVAGINAL OB US TECHNIQUE: Both transabdominal and transvaginal ultrasound examinations were performed for complete evaluation of the gestation as well as the maternal uterus, adnexal regions, and pelvic cul-de-sac. Transvaginal technique was performed to assess early pregnancy. COMPARISON:  None. FINDINGS: Intrauterine gestational sac: Single Yolk sac:  Visualized. Embryo:  Visualized. Cardiac Activity: Not Visualized. CRL: 21.3 mm  8 w   5 d                  [place] EDC: 12/01/2020 Subchorionic hemorrhage: Small subchorionic hemorrhage at the inferior sac. Maternal uterus/adnexae: Ovaries are within normal limits. Left ovary measures 1.5 x 2.8 x 2.9 cm and contains a corpus luteum. The right ovary measures 1.1 x 1.9 x 2.1 cm. No significant free fluid. IMPRESSION: Single IUP with fetal crown-rump length of 21.3 mm but no cardiac activity. Findings meet definitive criteria for failed pregnancy. This follows SRU consensus guidelines: Diagnostic Criteria for  Nonviable Pregnancy Early in the First Trimester. Macy Mis J Med [phone]. Small subchorionic hemorrhage Electronically Signed   By: Jasmine Pang M.D.   On: 04/26/2020 18:11    MAU Management/MDM: Orders Placed This Encounter  Procedures  . Wet prep, genital  . US OB LESS THAN 14 WEEKS WITH OB TRANSVAGINAL  . Urinalysis, Routine w reflex microscopic  . CBC  . hCG, quantitative, pregnancy  . Pregnancy, urine POC  . Discharge patient    No orders of the defined types were placed in this encounter.   [place] today confirms missed ab at [redacted]w[redacted]d.  Discussed results with pt and her husband who were visibly upset and mourned appropriately.  Discussed options including expectant management, Cytotec, and D&C. Pt would like to see her Dr, Dr Chestine Spore before making a decision.  Message sent to [facility] office to schedule pt with Dr Chestine Spore.  Pt discharged with strict return/bleeding precautions.    ASSESSMENT 1. Missed ab   2. Vaginal bleeding in pregnancy, first trimester     PLAN Discharge home Allergies as of 04/26/2020      Reactions   Nuvaring [etonogestrel-ethinyl Estradiol]    Vaginal burning, HA, bloating, mood change, nose bleed      Medication List    STOP taking these medications   ibuprofen 600 MG tablet Commonly known as: ADVIL     TAKE these medications   cefdinir 300 MG capsule Commonly known as: OMNICEF 1 cap po bid   DULoxetine 30 MG capsule Commonly known as: Cymbalta Take 1 capsule by mouth daily for 15 days, then increase to 2 capsules daily.   ELDERBERRY PO Take by mouth.   ondansetron 8 MG tablet Commonly known as: ZOFRAN 1 tab po tid prn nausea   prenatal multivitamin Tabs tablet Take 1 tablet by mouth at bedtime.   VITAMIN C ADULT GUMMIES PO Take by mouth.      Follow-up Information    Marlow Baars, MD Follow up.   Specialty: Obstetrics Why: The office will call you to make an appointment.  Contact information: [address] [place] [address] [phone]        [facility] Follow up.   Specialty: Obstetrics and Gynecology Why: Return as needed for emergencies. Contact information: [address] [id number] mc [place] [address] [phone]          Sharen Counter Certified Nurse-Midwife 04/27/2020  5:37 PM

## 2020-04-26 NOTE — MAU Note (Signed)
PT reports pregnancy confirmed at [facility] by [place].  States due date 11/13/2020. Reports vaginal bleeding which started this morning. Unable to tell how much. Has ongoing cramping and lower back pain. Had intercourse on Saturday.

## 2020-04-27 LAB — GC/CHLAMYDIA PROBE AMP (~~LOC~~) NOT AT ARMC
Chlamydia: NEGATIVE
Comment: NEGATIVE
Comment: NORMAL
Neisseria Gonorrhea: NEGATIVE

## 2020-04-29 ENCOUNTER — Encounter (HOSPITAL_BASED_OUTPATIENT_CLINIC_OR_DEPARTMENT_OTHER): Payer: Self-pay | Admitting: Obstetrics

## 2020-04-29 ENCOUNTER — Other Ambulatory Visit (HOSPITAL_COMMUNITY)
Admission: RE | Admit: 2020-04-29 | Discharge: 2020-04-29 | Disposition: A | Discharge status: Home / Self Care | Payer: 59 | Source: Ambulatory Visit | Attending: Obstetrics | Admitting: Obstetrics

## 2020-04-29 ENCOUNTER — Other Ambulatory Visit: Payer: Self-pay

## 2020-04-29 DIAGNOSIS — O021 Missed abortion: Secondary | ICD-10-CM

## 2020-04-29 DIAGNOSIS — Z20822 Contact with and (suspected) exposure to covid-19: Secondary | ICD-10-CM | POA: Diagnosis not present

## 2020-04-29 DIAGNOSIS — Z01812 Encounter for preprocedural laboratory examination: Secondary | ICD-10-CM | POA: Insufficient documentation

## 2020-04-29 HISTORY — DX: Missed abortion: O02.1

## 2020-04-29 LAB — SARS CORONAVIRUS 2 (TAT 6-24 HRS): SARS Coronavirus 2: NEGATIVE

## 2020-04-29 NOTE — Anesthesia Preprocedure Evaluation (Addendum)
Anesthesia Evaluation  Patient identified by MRN, date of birth, ID band Patient awake    Reviewed: Allergy & Precautions, NPO status , Patient's Chart, lab work & pertinent test results  History of Anesthesia Complications Negative for: history of anesthetic complications  Airway Mallampati: II  TM Distance: >3 FB Neck ROM: Full    Dental no notable dental hx.    Pulmonary neg pulmonary ROS,    Pulmonary exam normal        Cardiovascular negative cardio ROS Normal cardiovascular exam     Neuro/Psych Anxiety Depression negative neurological ROS     GI/Hepatic negative GI ROS, Neg liver ROS,   Endo/Other  BMI 32  Renal/GU negative Renal ROS  negative genitourinary   Musculoskeletal negative musculoskeletal ROS (+)   Abdominal   Peds  Hematology negative hematology ROS (+)   Anesthesia Other Findings Day of surgery medications reviewed with patient.  Reproductive/Obstetrics Missed Ab                            Anesthesia Physical Anesthesia Plan  ASA: II  Anesthesia Plan: General   Post-op Pain Management:    Induction: Intravenous  PONV Risk Score and Plan: 3 and Treatment may vary due to age or medical condition, Ondansetron, Dexamethasone, Midazolam and Scopolamine patch - Pre-op  Airway Management Planned: LMA  Additional Equipment: None  Intra-op Plan:   Post-operative Plan: Extubation in OR  Informed Consent: I have reviewed the patients History and Physical, chart, labs and discussed the procedure including the risks, benefits and alternatives for the proposed anesthesia with the patient or authorized representative who has indicated his/her understanding and acceptance.     Dental advisory given  Plan Discussed with: CRNA  Anesthesia Plan Comments:        Anesthesia Quick Evaluation

## 2020-04-29 NOTE — Progress Notes (Signed)
Spoke w/ via phone for pre-op interview--- PT Lab needs dos----  T&S             Lab results------ no COVID test ------ 04-30-2023 @ 1330 ([facility] covid test site notified that this is a add-on for tomorrow) Arrive at ------- 0530 NPO after ------ MN Medications to take morning of surgery ----- NONE Diabetic medication ----- n/a Patient Special Instructions ----- n/a Pre-Op special Istructions ----- n/a Patient verbalized understanding of instructions that were given at this phone interview. Patient denies shortness of breath, chest pain, fever, cough a this phone interview.

## 2020-04-30 ENCOUNTER — Ambulatory Visit (HOSPITAL_BASED_OUTPATIENT_CLINIC_OR_DEPARTMENT_OTHER): Payer: 59 | Admitting: Anesthesiology

## 2020-04-30 ENCOUNTER — Encounter (HOSPITAL_BASED_OUTPATIENT_CLINIC_OR_DEPARTMENT_OTHER): Payer: Self-pay | Admitting: Obstetrics

## 2020-04-30 ENCOUNTER — Other Ambulatory Visit: Payer: Self-pay

## 2020-04-30 ENCOUNTER — Ambulatory Visit (HOSPITAL_COMMUNITY)
Admission: RE | Admit: 2020-04-30 | Discharge: 2020-04-30 | Disposition: A | Discharge status: Home / Self Care | Payer: 59 | Source: Ambulatory Visit | Attending: Obstetrics | Admitting: Obstetrics

## 2020-04-30 ENCOUNTER — Encounter (HOSPITAL_BASED_OUTPATIENT_CLINIC_OR_DEPARTMENT_OTHER)
Admission: RE | Disposition: A | Discharge status: Home / Self Care | Payer: Self-pay | Source: Home / Self Care | Attending: Obstetrics

## 2020-04-30 ENCOUNTER — Ambulatory Visit (HOSPITAL_BASED_OUTPATIENT_CLINIC_OR_DEPARTMENT_OTHER)
Admission: RE | Admit: 2020-04-30 | Discharge: 2020-04-30 | Disposition: A | Discharge status: Home / Self Care | Payer: 59 | Attending: Obstetrics | Admitting: Obstetrics

## 2020-04-30 DIAGNOSIS — Q513 Bicornate uterus: Secondary | ICD-10-CM | POA: Insufficient documentation

## 2020-04-30 DIAGNOSIS — Z8759 Personal history of other complications of pregnancy, childbirth and the puerperium: Secondary | ICD-10-CM | POA: Insufficient documentation

## 2020-04-30 DIAGNOSIS — Z8632 Personal history of gestational diabetes: Secondary | ICD-10-CM | POA: Insufficient documentation

## 2020-04-30 DIAGNOSIS — Z98891 History of uterine scar from previous surgery: Secondary | ICD-10-CM | POA: Insufficient documentation

## 2020-04-30 DIAGNOSIS — O021 Missed abortion: Secondary | ICD-10-CM | POA: Insufficient documentation

## 2020-04-30 DIAGNOSIS — Z3A08 8 weeks gestation of pregnancy: Secondary | ICD-10-CM | POA: Insufficient documentation

## 2020-04-30 DIAGNOSIS — F329 Major depressive disorder, single episode, unspecified: Secondary | ICD-10-CM | POA: Diagnosis not present

## 2020-04-30 DIAGNOSIS — O99341 Other mental disorders complicating pregnancy, first trimester: Secondary | ICD-10-CM | POA: Insufficient documentation

## 2020-04-30 DIAGNOSIS — O3401 Maternal care for unspecified congenital malformation of uterus, first trimester: Secondary | ICD-10-CM | POA: Insufficient documentation

## 2020-04-30 DIAGNOSIS — F411 Generalized anxiety disorder: Secondary | ICD-10-CM | POA: Insufficient documentation

## 2020-04-30 DIAGNOSIS — N96 Recurrent pregnancy loss: Secondary | ICD-10-CM | POA: Insufficient documentation

## 2020-04-30 HISTORY — PX: OPERATIVE ULTRASOUND: SHX5996

## 2020-04-30 HISTORY — PX: DILATION AND EVACUATION: SHX1459

## 2020-04-30 HISTORY — DX: Recurrent pregnancy loss: N96

## 2020-04-30 LAB — TYPE AND SCREEN
ABO/RH(D): O POS
Antibody Screen: NEGATIVE

## 2020-04-30 SURGERY — DILATION AND EVACUATION, UTERUS
Anesthesia: General | Site: Vagina

## 2020-04-30 MED ORDER — PROPOFOL 10 MG/ML IV BOLUS
INTRAVENOUS | Status: DC | PRN
  Administered 2020-04-30: 160 mg via INTRAVENOUS

## 2020-04-30 MED ORDER — ONDANSETRON HCL 4 MG/2ML IJ SOLN
INTRAMUSCULAR | Status: DC | PRN
  Administered 2020-04-30: 4 mg via INTRAVENOUS

## 2020-04-30 MED ORDER — LIDOCAINE HCL 1 % IJ SOLN
INTRAMUSCULAR | Status: DC | PRN
  Administered 2020-04-30: 10 mL

## 2020-04-30 MED ORDER — MIDAZOLAM HCL 2 MG/2ML IJ SOLN
INTRAMUSCULAR | Status: AC
  Filled 2020-04-30: qty 2

## 2020-04-30 MED ORDER — PROMETHAZINE HCL 25 MG/ML IJ SOLN: 6.2500 mg | INTRAMUSCULAR | Status: DC | PRN

## 2020-04-30 MED ORDER — ACETAMINOPHEN 500 MG PO TABS
ORAL_TABLET | ORAL | Status: AC
  Filled 2020-04-30: qty 2

## 2020-04-30 MED ORDER — LIDOCAINE 2% (20 MG/ML) 5 ML SYRINGE
INTRAMUSCULAR | Status: AC
  Filled 2020-04-30: qty 5

## 2020-04-30 MED ORDER — LACTATED RINGERS IV SOLN: INTRAVENOUS | Status: DC

## 2020-04-30 MED ORDER — FENTANYL CITRATE (PF) 100 MCG/2ML IJ SOLN
INTRAMUSCULAR | Status: DC | PRN
  Administered 2020-04-30 (×2): 50 ug via INTRAVENOUS

## 2020-04-30 MED ORDER — FENTANYL CITRATE (PF) 100 MCG/2ML IJ SOLN
INTRAMUSCULAR | Status: AC
  Filled 2020-04-30: qty 2

## 2020-04-30 MED ORDER — DEXAMETHASONE SODIUM PHOSPHATE 10 MG/ML IJ SOLN
INTRAMUSCULAR | Status: AC
  Filled 2020-04-30: qty 1

## 2020-04-30 MED ORDER — DOXYCYCLINE HYCLATE 100 MG IV SOLR
200.0000 mg | Freq: Once | INTRAVENOUS | Status: AC
  Administered 2020-04-30: 200 mg via INTRAVENOUS
  Filled 2020-04-30: qty 200

## 2020-04-30 MED ORDER — ACETAMINOPHEN 500 MG PO TABS
1000.0000 mg | ORAL_TABLET | Freq: Once | ORAL | Status: AC
  Administered 2020-04-30: 1000 mg via ORAL

## 2020-04-30 MED ORDER — SCOPOLAMINE 1 MG/3DAYS TD PT72
MEDICATED_PATCH | TRANSDERMAL | Status: AC
  Filled 2020-04-30: qty 1

## 2020-04-30 MED ORDER — SCOPOLAMINE 1 MG/3DAYS TD PT72
1.0000 | MEDICATED_PATCH | Freq: Once | TRANSDERMAL | Status: DC
  Administered 2020-04-30: 1.5 mg via TRANSDERMAL

## 2020-04-30 MED ORDER — MIDAZOLAM HCL 2 MG/2ML IJ SOLN
INTRAMUSCULAR | Status: DC | PRN
  Administered 2020-04-30: 2 mg via INTRAVENOUS

## 2020-04-30 MED ORDER — KETOROLAC TROMETHAMINE 30 MG/ML IJ SOLN
INTRAMUSCULAR | Status: DC | PRN
Start: 2020-04-30 — End: 2020-04-30
  Administered 2020-04-30: 30 mg via INTRAVENOUS

## 2020-04-30 MED ORDER — FENTANYL CITRATE (PF) 100 MCG/2ML IJ SOLN
25.0000 ug | INTRAMUSCULAR | Status: DC | PRN
  Administered 2020-04-30 (×2): 25 ug via INTRAVENOUS

## 2020-04-30 MED ORDER — ONDANSETRON HCL 4 MG/2ML IJ SOLN
INTRAMUSCULAR | Status: AC
  Filled 2020-04-30: qty 2

## 2020-04-30 MED ORDER — PROPOFOL 10 MG/ML IV BOLUS
INTRAVENOUS | Status: AC
  Filled 2020-04-30: qty 20

## 2020-04-30 MED ORDER — LIDOCAINE 2% (20 MG/ML) 5 ML SYRINGE
INTRAMUSCULAR | Status: DC | PRN
  Administered 2020-04-30: 80 mg via INTRAVENOUS

## 2020-04-30 MED ORDER — DEXAMETHASONE SODIUM PHOSPHATE 10 MG/ML IJ SOLN
INTRAMUSCULAR | Status: DC | PRN
  Administered 2020-04-30: 5 mg via INTRAVENOUS

## 2020-04-30 MED ORDER — KETOROLAC TROMETHAMINE 30 MG/ML IJ SOLN
INTRAMUSCULAR | Status: AC
  Filled 2020-04-30: qty 1

## 2020-04-30 MED ORDER — WHITE PETROLATUM EX OINT
TOPICAL_OINTMENT | CUTANEOUS | Status: AC
  Filled 2020-04-30: qty 5

## 2020-04-30 MED ORDER — OXYCODONE HCL 5 MG PO TABS: 5.0000 mg | ORAL_TABLET | Freq: Once | ORAL | Status: DC | PRN

## 2020-04-30 MED ORDER — OXYCODONE HCL 5 MG/5ML PO SOLN: 5.0000 mg | Freq: Once | ORAL | Status: DC | PRN

## 2020-04-30 SURGICAL SUPPLY — 23 items
CATH ROBINSON RED A/P 16FR (CATHETERS) IMPLANT
CNTNR URN SCR LID CUP LEK RST (MISCELLANEOUS) IMPLANT
CONT SPEC 4OZ STRL OR WHT (MISCELLANEOUS)
DECANTER SPIKE VIAL GLASS SM (MISCELLANEOUS) IMPLANT
DILATOR CANAL MILEX (MISCELLANEOUS) IMPLANT
GLOVE BIOGEL PI IND STRL 7.0 (GLOVE) ×2 IMPLANT
GLOVE BIOGEL PI INDICATOR 7.0 (GLOVE) ×1
GLOVE ECLIPSE 6.0 STRL STRAW (GLOVE) ×3 IMPLANT
GOWN STRL REUS W/ TWL LRG LVL3 (GOWN DISPOSABLE) ×4 IMPLANT
GOWN STRL REUS W/TWL LRG LVL3 (GOWN DISPOSABLE) ×2
HOSE CONNECTING 18IN BERKELEY (TUBING) ×3 IMPLANT
KIT BERKELEY 1ST TRI 3/8 NO TR (MISCELLANEOUS) ×3 IMPLANT
KIT BERKELEY 1ST TRIMESTER 3/8 (MISCELLANEOUS) ×6 IMPLANT
NS IRRIG 1000ML POUR BTL (IV SOLUTION) IMPLANT
NS IRRIG 500ML POUR BTL (IV SOLUTION) ×3 IMPLANT
PACK VAGINAL MINOR WOMEN LF (CUSTOM PROCEDURE TRAY) ×3 IMPLANT
PAD OB MATERNITY 4.3X12.25 (PERSONAL CARE ITEMS) ×3 IMPLANT
SET BERKELEY SUCTION TUBING (SUCTIONS) ×3 IMPLANT
UNDERPAD 30X30 (UNDERPADS AND DIAPERS) IMPLANT
VACURETTE 10 RIGID CVD (CANNULA) IMPLANT
VACURETTE 7MM CVD STRL WRAP (CANNULA) IMPLANT
VACURETTE 8 RIGID CVD (CANNULA) ×3 IMPLANT
VACURETTE 9 RIGID CVD (CANNULA) IMPLANT

## 2020-04-30 NOTE — H&P (Signed)
34 y.o. Sandra Bauer presents for [facility] for missed abortion.  She was seen at MAU on 5/10 for bleeding and no fetal cardiac activity was detected.  She requested a f/u US at our office that confirmed a missed abortion.  Fetal pole measured [redacted]w[redacted]d.    Past Medical History:  Diagnosis Date  . Bicornate uterus    Renal u/s 11/2015 to eval for renal abnormalities: NORMAL  . GAD (generalized anxiety disorder)   . History of gestational diabetes 2017  . History of recurrent miscarriages 11/ 2016;  06/ 2020   06/ 2020 molar pregnancy  . Missed ab 04/29/2020  . Recurrent depression (HCC)   . Seasonal allergic rhinitis   . Wears contact lenses     Past Surgical History:  Procedure Laterality Date  . CESAREAN SECTION N/A 09/01/2016   Procedure: CESAREAN SECTION;  Surgeon: Marlow Baars, MD;  Location: [facility] BIRTHING SUITES;  Service: Obstetrics;  Laterality: N/A;  . CESAREAN SECTION N/A 02/28/2018   Procedure: REPEAT CESAREAN SECTION;  Surgeon: [name], MD;  Location: [facility] BIRTHING SUITES;  Service: Obstetrics;  Laterality: N/A;  . DILATION AND EVACUATION N/A 10/27/2015   Procedure: DILATATION AND EVACUATION ULTRASOUND;  Surgeon: Marlow Baars, MD;  Location: [facility];  Service: Gynecology;  Laterality: N/A;  Ultrasound called on 10/26/15 by Donne Hazel RN  . DILATION AND EVACUATION N/A 06/02/2019   Procedure: DILATATION AND EVACUATION;  Surgeon: Marlow Baars, MD;  Location: [facility];  Service: Gynecology;  Laterality: N/A;  . WISDOM TOOTH EXTRACTION      OB History  Gravida Para Term Preterm AB Living  5 2 1 1 2 2   SAB TAB Ectopic Multiple Live Births  1     0 2    # Outcome Date GA Lbr Len/2nd Weight Sex Delivery Anes PTL Lv  5 Current           4 Preterm 02/28/18 [redacted]w[redacted]d  3200 g M CS-LTranv Spinal  LIV  3 Term 09/01/16 [redacted]w[redacted]d  3155 g F CS-LTranv Spinal  LIV  2 Molar           1 SAB             Social History   Socioeconomic History  . Marital status: Married    Spouse  name: Not on file  . Number of children: Not on file  . Years of education: Not on file  . Highest education level: Not on file  Occupational History  . Not on file  Social Needs  . Financial resource strain: Not on file  . Food insecurity    Worry: Not on file    Inability: Not on file  . Transportation needs    Medical: Not on file    Non-medical: Not on file  Tobacco Use  . Smoking status: Never Smoker  . Smokeless tobacco: Never Used  Substance and Sexual Activity  . Alcohol use: No  . Drug use: Never  . Sexual activity: Yes    Birth control/protection: None  Social History Narrative   Married 2015.   Orig from 2016, lives in [place], [place].   Has three younger sisters in [place].   Attending nursing school, works full time at [employer] in [employer].   No T/A/Ds.   Nuvaring [etonogestrel-ethinyl estradiol]   Other PNC: uncomplicated.  ABO, Rh: --/--/O POS, O POS Performed at [facility], [address], [place], [place]  [phone] 1126) Antibody: NEG (06/15 1126)  Vitals:  04/30/20 0601  BP: 115/75  Pulse: 82  Resp: 14  Temp: 97.9 F (36.6 C)  SpO2: 99%       A/P   34 y.o.  [id number]  presents for dilation and curettage for missed abortion.     Elects for surgical management.  Discussed risks to include infection, bleeding, damage to surrounding structures (including but not limited to vagina, cervix, bladder, uterus), uterine perforation, need for additional procedures.  All questions answered and patient elects to proceed.   RPL--will send anora POC Rh: positive  [place]

## 2020-04-30 NOTE — Anesthesia Procedure Notes (Signed)
Procedure Name: LMA Insertion Date/Time: 04/30/2020 7:23 AM Performed by: Francie Massing, CRNA Pre-anesthesia Checklist: Patient identified, Emergency Drugs available, Suction available and Patient being monitored Patient Re-evaluated:Patient Re-evaluated prior to induction Oxygen Delivery Method: Circle system utilized Preoxygenation: Pre-oxygenation with 100% oxygen Induction Type: IV induction Ventilation: Mask ventilation without difficulty LMA: LMA inserted LMA Size: 4.0 Number of attempts: 1 Airway Equipment and Method: Bite block Placement Confirmation: positive ETCO2 Tube secured with: Tape Dental Injury: Teeth and Oropharynx as per pre-operative assessment

## 2020-04-30 NOTE — Transfer of Care (Signed)
Immediate Anesthesia Transfer of Care Note  Patient: Sandra Bauer  Procedure(s) Performed: Procedure(s) (LRB): DILATATION AND EVACUATION (N/A) CHROMOSOME STUDIES (N/A) OPERATIVE ULTRASOUND (N/A)  Patient Location: PACU  Anesthesia Type: General  Level of Consciousness: awake, oriented, sedated and patient cooperative  Airway & Oxygen Therapy: Patient Spontanous Breathing and Patient connected to face mask oxygen  Post-op Assessment: Report given to PACU RN and Post -op Vital signs reviewed and stable  Post vital signs: Reviewed and stable  Complications: No apparent anesthesia complications Last Vitals:  Vitals Value Taken Time  BP 119/71 04/30/20 0800  Temp 36.8 C 04/30/20 0750  Pulse 104 04/30/20 0813  Resp 10 04/30/20 0813  SpO2 92 % 04/30/20 0813  Vitals shown include unvalidated device data.  Last Pain:  Vitals:   04/30/20 0800  TempSrc:   PainSc: 7       Patients Stated Pain Goal: 6 (04/30/20 0601)

## 2020-04-30 NOTE — Op Note (Signed)
.  Pre-Operative Diagnosis:  1. Missed abortion at 8 weeks  2.  Bicornuate uterus  3.  Recurrent pregnancy loss  Postoperative Diagnosis: 1. Missed abortion at 8 weeks  2.  Bicornuate uterus  3.  Recurrent pregnancy loss  Procedure: Dilation and curettage under ultrasound guidance   Surgeon: Marlow Baars, MD  Operative Findings: Bicornuate uterus, anteverted, with pregnancy in the right horn.  Specimen: products of conception, both for routine pathology analysis as well as chromosome analysis due to history of recurrent pregnancy loss  EBL: 25cc    After adequate anesthesia was achieved, the patient placed in the dorsal lithotomy position in [place] stirrups.  She was prepped and draped in the usual sterile fashion.  The bivalve speculum was placed in the vagina and the anterior lip of the cervix grasped with a single-tooth tenaculum.   10 cc of 1% lidocaine was administered in a paracervical fashion. The cervix was serially dilated with Hank dilators. Due to her known bicornuate uterus, ultrasound guidance was used.  An 60mm suction curette was advanced to the fundus under ultrasound guidance, the vacuum was engaged, and multiple suction passes were performed until the products of conception were evacuated.  Good uterine crie was noted in all quadrants.  Ultrasound showed a uniform stripe without any additional products of conception.  This completed the procedure.  All instruments were removed from the vagina.  Pressure was used to achieve hemostasis at the tenaculum site. The patient tolerated the procedure well was brought to the recovery room in stable condition for the procedure. All sponge and needle counts correct x2.   [place], [facility]

## 2020-04-30 NOTE — Progress Notes (Signed)
OR RN Assessment completed at 0715; documented at 59 due to pt care responsibilities.

## 2020-04-30 NOTE — Anesthesia Postprocedure Evaluation (Signed)
Anesthesia Post Note  Patient: ERILYN PEARMAN  Procedure(s) Performed: DILATATION AND EVACUATION (N/A Vagina ) CHROMOSOME STUDIES (N/A Vagina ) OPERATIVE ULTRASOUND (N/A Abdomen)     Patient location during evaluation: PACU Anesthesia Type: General Level of consciousness: awake and alert and oriented Pain management: pain level controlled Vital Signs Assessment: post-procedure vital signs reviewed and stable Respiratory status: spontaneous breathing, nonlabored ventilation and respiratory function stable Cardiovascular status: blood pressure returned to baseline Postop Assessment: no apparent nausea or vomiting Anesthetic complications: no    Last Vitals:  Vitals:   04/30/20 0750 04/30/20 0800  BP: 133/77 119/71  Pulse: (!) 120 89  Resp: 20 17  Temp: 36.8 C   SpO2:  99%    Last Pain:  Vitals:   04/30/20 0800  TempSrc:   PainSc: 7                  Kaylyn Layer

## 2020-04-30 NOTE — Discharge Instructions (Signed)

## 2020-05-03 LAB — SURGICAL PATHOLOGY

## 2020-07-12 ENCOUNTER — Encounter: Payer: Self-pay | Admitting: Family Medicine

## 2020-07-14 ENCOUNTER — Telehealth (INDEPENDENT_AMBULATORY_CARE_PROVIDER_SITE_OTHER): Payer: 59 | Admitting: Family Medicine

## 2020-07-14 ENCOUNTER — Encounter: Payer: Self-pay | Admitting: Family Medicine

## 2020-07-14 ENCOUNTER — Other Ambulatory Visit: Payer: Self-pay

## 2020-07-14 VITALS — BP 123/88 | Wt 174.0 lb

## 2020-07-14 DIAGNOSIS — Z8659 Personal history of other mental and behavioral disorders: Secondary | ICD-10-CM

## 2020-07-14 DIAGNOSIS — F411 Generalized anxiety disorder: Secondary | ICD-10-CM | POA: Diagnosis not present

## 2020-07-14 DIAGNOSIS — F3281 Premenstrual dysphoric disorder: Secondary | ICD-10-CM

## 2020-07-14 MED ORDER — PAROXETINE HCL 20 MG PO TABS
20.0000 mg | ORAL_TABLET | Freq: Every day | ORAL | 1 refills | Status: DC
Start: 2020-07-14 — End: 2020-08-30

## 2020-07-14 NOTE — Progress Notes (Signed)
Virtual Visit via Video Note  I connected with pt on 07/14/20 at 10:00 AM EDT by a video enabled telemedicine application and verified that I am speaking with the correct person using two identifiers.  Location patient: home Location provider:work or home office Persons participating in the virtual visit: patient, provider  I discussed the limitations of evaluation and management by telemedicine and the availability of in person appointments. The patient expressed understanding and agreed to proceed.   HPI: 34 y/o female being seen today for anxiety. Long hx of GAD and recurrent MDD, has been on citalopram in the past which was helpful. Has had signif problems/distress with recurrent miscarriages, most recently 04/2020.  "A lot going on". A close assoc at work died in motorcycle wreck recently. Experiencing lots of bleeding since having her d and c 04/2020. Around time of menses she has been having lots of anger issues, anxiety, mood swings (about a week prior to menses). Chronic anxiety as well, lifelong, worse since having children b/c she is constantly worried about them.  Periods of panic sx's: chest pain, sob for a few minutes.  Trouble sleeping.  Energy level fluctuates, as does her focus/concentration. Denies depression. Past use of cymbalta caused HAs and GI symptoms.    ROS: no fevers, no wheezing, no cough, no dizziness, no HAs, no rashes, no melena/hematochezia.  No polyuria or polydipsia.  No myalgias or arthralgias.  No focal weakness, paresthesias, or tremors.  No acute vision or hearing abnormalities. No n/v/d or abd pain.  No palpitations.     Past Medical History:  Diagnosis Date  . Bicornate uterus    Renal u/s 11/2015 to eval for renal abnormalities: NORMAL  . GAD (generalized anxiety disorder)   . History of gestational diabetes 2017  . 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 Missed ab 04/29/2020  . PMDD  (premenstrual dysphoric disorder)   . Recurrent depression (HCC)   . Seasonal allergic rhinitis   . Wears contact lenses     Past Surgical History:  Procedure Laterality Date  . CESAREAN SECTION N/A 09/01/2016   Procedure: CESAREAN SECTION;  Surgeon: Marlow Baars, MD;  Location: [facility] BIRTHING SUITES;  Service: Obstetrics;  Laterality: N/A;  . CESAREAN SECTION N/A 02/28/2018   Procedure: REPEAT CESAREAN SECTION;  Surgeon: [name], MD;  Location: [facility] BIRTHING SUITES;  Service: Obstetrics;  Laterality: N/A;  . DILATION AND EVACUATION N/A 10/27/2015   Procedure: DILATATION AND EVACUATION ULTRASOUND;  Surgeon: Marlow Baars, MD;  Location: [facility];  Service: Gynecology;  Laterality: N/A;  Ultrasound called on 10/26/15 by Donne Hazel RN  . DILATION AND EVACUATION N/A 06/02/2019   Procedure: DILATATION AND EVACUATION;  Surgeon: Marlow Baars, MD;  Location: [facility] [place];  Service: Gynecology;  Laterality: N/A;  . DILATION AND EVACUATION N/A 04/30/2020   Procedure: DILATATION AND EVACUATION;  Surgeon: Marlow Baars, MD;  Location: [facility];  Service: Gynecology;  Laterality: N/A;  with ultrasound guidance  . OPERATIVE ULTRASOUND N/A 04/30/2020   Procedure: OPERATIVE ULTRASOUND;  Surgeon: Marlow Baars, MD;  Location: [facility];  Service: Gynecology;  Laterality: N/A;  . WISDOM TOOTH EXTRACTION      Family History  Problem Relation Age of Onset  . Cancer Maternal Grandfather        "stomach"  . Diabetes Paternal Grandfather   . Stroke Paternal Grandfather      Current Outpatient Medications:  .  Ascorbic Acid (VITAMIN C ADULT GUMMIES PO), Take by mouth., Disp: , Rfl:  .  ELDERBERRY PO, Take by mouth., Disp: , Rfl:  .  Prenatal Vit-Fe Fumarate-FA (PRENATAL MULTIVITAMIN) TABS tablet, Take 1 tablet by mouth at bedtime., Disp: , Rfl:   EXAM:  VITALS per patient if applicable:  Vitals with BMI 07/14/2020 04/30/2020 04/30/2020  Height - - -   Weight 174 lbs - -  BMI - - -  Systolic 123 108 244  Diastolic 88 60 61  Pulse - 76 79     GENERAL: alert, oriented, appears well and in no acute distress  HEENT: atraumatic, conjunttiva clear, no obvious abnormalities on inspection of external nose and ears  NECK: normal movements of the head and neck  LUNGS: on inspection no signs of respiratory distress, breathing rate appears normal, no obvious gross SOB, gasping or wheezing  CV: no obvious cyanosis  MS: moves all visible extremities without noticeable abnormality  PSYCH/NEURO: pleasant and cooperative, no obvious depression or anxiety, speech and thought processing grossly intact  LABS: none today    Chemistry      Component Value Date/Time   NA 136 08/31/2016 1150   K 3.7 08/31/2016 1150   CL 110 08/31/2016 1150   CO2 19 (L) 08/31/2016 1150   BUN 10 08/31/2016 1150   CREATININE 0.48 08/31/2016 1150      Component Value Date/Time   CALCIUM 8.9 08/31/2016 1150   ALKPHOS 68 02/20/2012 0941   AST 22 02/20/2012 0941   ALT 22 02/20/2012 0941   BILITOT 0.2 (L) 02/20/2012 0941     ASSESSMENT AND PLAN:  Discussed the following assessment and plan:  GAD with PMDD.  Hx of MDD but not depressed at this time. She recalls ? Worse depression at some point on citalopram (suspect this was postpartum depression and likely not an effect of her citalopram) so we'll avoid this med. Cymbalta caused side effects (GI and HA's). Decided on trial of paxil 20mg , 1 tab po qd.  Therapeutic expectations and side effect profile of medication discussed today.  Patient's questions answered. She does not want to be on any med if she should become pregnant, which she and her husband are actively trying to do.  She will be vigilant in monitoring for missed menses and early home preg testing and wants to get off med as early as possible.  She wants to get a CPE so I told her to make appt for this to coincide with f/u for starting paxil--about  3-4 wks from now.  -we discussed possible serious and likely etiologies, options for evaluation and workup, limitations of telemedicine visit vs in person visit, treatment, treatment risks and precautions. Pt prefers to treat via telemedicine empirically rather then risking or undertaking an in person visit at this moment. Patient agrees to seek prompt in person care if worsening, new symptoms arise, or if is not improving with treatment.   I discussed the assessment and treatment plan with the patient. The patient was provided an opportunity to ask questions and all were answered. The patient agreed with the plan and demonstrated an understanding of the instructions.   The patient was advised to call back or seek an in-person evaluation if the symptoms worsen or if the condition fails to improve as anticipated.  F/u: 3-4 wks, CPE and f/u anxiety/PMDD  Signed:  , MD           07/14/2020

## 2020-08-06 ENCOUNTER — Telehealth: Payer: Self-pay

## 2020-08-06 NOTE — Telephone Encounter (Signed)
Received VM from patient that blood on R eye and inside L ear. Proceeded to put in contact this morning and noticed blood under eyelid. When first taking the medication, increased heart rate as well. She is unsure if this is a side effect from medication. CPE/ f/u appt made to discuss medication. Advised PCP out of the office until 8/24 but would be sent for review. If she did not hear anything by 4pm, proceed to urgent care/ED for further evaluation.  Please advise, thanks.

## 2020-08-08 NOTE — Telephone Encounter (Signed)
It is VERY unlikely that the blood has anything to do with her medication.

## 2020-08-10 NOTE — Telephone Encounter (Signed)
Left detailed message advising of what PCP mentioned, okay per dpr.

## 2020-08-20 ENCOUNTER — Encounter: Payer: 59 | Admitting: Family Medicine

## 2020-08-26 ENCOUNTER — Other Ambulatory Visit: Payer: Self-pay

## 2020-08-30 ENCOUNTER — Encounter: Payer: Self-pay | Admitting: Family Medicine

## 2020-08-30 ENCOUNTER — Ambulatory Visit (INDEPENDENT_AMBULATORY_CARE_PROVIDER_SITE_OTHER): Payer: 59 | Admitting: Family Medicine

## 2020-08-30 ENCOUNTER — Other Ambulatory Visit: Payer: Self-pay

## 2020-08-30 VITALS — BP 128/77 | HR 83 | Temp 98.2°F | Resp 16 | Ht 60.5 in | Wt 176.2 lb

## 2020-08-30 DIAGNOSIS — F3281 Premenstrual dysphoric disorder: Secondary | ICD-10-CM | POA: Diagnosis not present

## 2020-08-30 DIAGNOSIS — F411 Generalized anxiety disorder: Secondary | ICD-10-CM

## 2020-08-30 DIAGNOSIS — Z0000 Encounter for general adult medical examination without abnormal findings: Secondary | ICD-10-CM

## 2020-08-30 DIAGNOSIS — E669 Obesity, unspecified: Secondary | ICD-10-CM

## 2020-08-30 MED ORDER — PAROXETINE HCL 20 MG PO TABS
ORAL_TABLET | ORAL | 1 refills | Status: DC
Start: 2020-08-30 — End: 2020-09-13

## 2020-08-30 NOTE — Progress Notes (Signed)
Office Note 08/30/2020  CC:  Chief Complaint  Patient presents with  . Annual Exam    pt is not fasting and also following up for GAD, depression    HPI:  Sandra Bauer is a 34 y.o. female who is here for annual health maintenance exam and 6 wk f/u GAD. A/P as of last visit: "GAD with PMDD.  Hx of MDD but not depressed at this time. She recalls ? Worse depression at some point on citalopram (suspect this was postpartum depression and likely not an effect of her citalopram) so we'll avoid this med. Cymbalta caused side effects (GI and HA's). Decided on trial of paxil 20mg , 1 tab po qd.  Therapeutic expectations and side effect profile of medication discussed today.  Patient's questions answered. She does not want to be on any med if she should become pregnant, which she and her husband are actively trying to do.  She will be vigilant in monitoring for missed menses and early home preg testing and wants to get off med as early as possible.  She wants to get a CPE so I told her to make appt for this to coincide with f/u for starting paxil--about 3-4 wks from now."  INTERIM HX: Taking the paxil whole 20mg  tab every day.  `Feeling better: less irritability esp during menses.  Feels more "neutral".  Some emotional blunting.  Mood less depressed. Initially felt heart palpitations/increased energy level, brief, but this war off in a few days.  Sleep is not as good--trying to find out whether to take it in daytime or nighttime.  Notes signif impaired libido. Makes her thirsty.   Exercise: chasing her kids around, taking walks with kids, working from home for . Diet: not a big priority for her right now. Goes to on [employer].  Past Medical History:  Diagnosis Date  . Bicornate uterus    Renal u/s 11/2015 to eval for renal abnormalities: NORMAL  . GAD (generalized anxiety disorder)   . History of gestational diabetes 2017  . History of recurrent  miscarriages 11/ 2016;  06/ 2020; 04/2020   06/ 2020 molar pregnancy  . Missed ab 04/29/2020  . PMDD (premenstrual dysphoric disorder)   . Recurrent depression (HCC)   . Seasonal allergic rhinitis   . Wears contact lenses     Past Surgical History:  Procedure Laterality Date  . CESAREAN SECTION N/A 09/01/2016   Procedure: CESAREAN SECTION;  Surgeon: 05/01/2020, MD;  Location: [facility] BIRTHING SUITES;  Service: Obstetrics;  Laterality: N/A;  . CESAREAN SECTION N/A 02/28/2018   Procedure: REPEAT CESAREAN SECTION;  Surgeon: [facility], MD;  Location: [facility] BIRTHING SUITES;  Service: Obstetrics;  Laterality: N/A;  . DILATION AND EVACUATION N/A 10/27/2015   Procedure: DILATATION AND EVACUATION ULTRASOUND;  Surgeon: [facility], MD;  Location: [facility];  Service: Gynecology;  Laterality: N/A;  Ultrasound called on 10/26/15 by Marlow Baars RN  . DILATION AND EVACUATION N/A 06/02/2019   Procedure: DILATATION AND EVACUATION;  Surgeon: Donne Hazel, MD;  Location: [facility] [place];  Service: Gynecology;  Laterality: N/A;  . DILATION AND EVACUATION N/A 04/30/2020   Procedure: DILATATION AND EVACUATION;  Surgeon: [facility], MD;  Location: [facility];  Service: Gynecology;  Laterality: N/A;  with ultrasound guidance  . OPERATIVE ULTRASOUND N/A 04/30/2020   Procedure: OPERATIVE ULTRASOUND;  Surgeon: [facility], MD;  Location: [facility];  Service: Gynecology;  Laterality: N/A;  . WISDOM TOOTH EXTRACTION  Family History  Problem Relation Age of Onset  . Cancer Maternal Grandfather        "stomach"  . Diabetes Paternal Grandfather   . Stroke Paternal Grandfather     Social History   Socioeconomic History  . Marital status: Married    Spouse name: Not on file  . Number of children: Not on file  . Years of education: Not on file  . Highest education level: Not on file  Occupational History  . Not on file  Tobacco Use  . Smoking status:  Never Smoker  . Smokeless tobacco: Never Used  Vaping Use  . Vaping Use: Never used  Substance and Sexual Activity  . Alcohol use: No  . Drug use: Never  . Sexual activity: Yes    Birth control/protection: None  Other Topics Concern  . Not on file  Social History Narrative   Married 2015.   Orig from [place], lives in [place], [place].   Has three younger sisters in [place].   Attending nursing school, works full time at [employer] in [place].   No T/A/Ds.   Social Determinants of Health   Financial Resource Strain:   . Difficulty of Paying Living Expenses: Not on file  Food Insecurity:   . Worried About Programme researcher, broadcasting/film/videounning Out of Food in the Last Year: Not on file  . Ran Out of Food in the Last Year: Not on file  Transportation Needs:   . Lack of Transportation (Medical): Not on file  . Lack of Transportation (Non-Medical): Not on file  Physical Activity:   . Days of Exercise per Week: Not on file  . Minutes of Exercise per Session: Not on file  Stress:   . Feeling of Stress : Not on file  Social Connections:   . Frequency of Communication with Friends and Family: Not on file  . Frequency of Social Gatherings with Friends and Family: Not on file  . Attends Religious Services: Not on file  . Active Member of Clubs or Organizations: Not on file  . Attends BankerClub or Organization Meetings: Not on file  . Marital Status: Not on file  Intimate Partner Violence:   . Fear of Current or Ex-Partner: Not on file  . Emotionally Abused: Not on file  . Physically Abused: Not on file  . Sexually Abused: Not on file    Outpatient Medications Prior to Visit  Medication Sig Dispense Refill  . Ascorbic Acid (VITAMIN C ADULT GUMMIES PO) Take by mouth.    . ELDERBERRY PO Take by mouth.    . Prenatal Vit-Fe Fumarate-FA (PRENATAL MULTIVITAMIN) TABS tablet Take 1 tablet by mouth at bedtime.    Marland Kitchen. PARoxetine (PAXIL) 20 MG tablet Take 1 tablet (20 mg total) by mouth daily. 30 tablet 1  . tranexamic acid (LYSTEDA) 650  MG TABS tablet tranexamic acid 650 mg tablet (Patient not taking: Reported on 08/30/2020)     No facility-administered medications prior to visit.    Allergies  Allergen Reactions  . Nuvaring [Etonogestrel-Ethinyl Estradiol]     Vaginal burning, HA, bloating, mood change, nose bleed    ROS Review of Systems  Constitutional: Negative for appetite change, chills, fatigue and fever.  HENT: Negative for congestion, dental problem, ear pain and sore throat.   Eyes: Negative for discharge, redness and visual disturbance.  Respiratory: Negative for cough, chest tightness, shortness of breath and wheezing.   Cardiovascular: Negative for chest pain, palpitations and leg swelling.  Gastrointestinal: Negative for abdominal pain, blood in stool,  diarrhea, nausea and vomiting.  Genitourinary: Negative for difficulty urinating, dysuria, flank pain, frequency, hematuria and urgency.  Musculoskeletal: Negative for arthralgias, back pain, joint swelling, myalgias and neck stiffness.  Skin: Negative for pallor and rash.  Neurological: Negative for dizziness, speech difficulty, weakness and headaches.  Hematological: Negative for adenopathy. Does not bruise/bleed easily.  Psychiatric/Behavioral: Positive for decreased concentration and dysphoric mood. Negative for agitation, behavioral problems, confusion, hallucinations, self-injury, sleep disturbance and suicidal ideas. The patient is nervous/anxious. The patient is not hyperactive.     PE; Vitals with BMI 08/30/2020 07/14/2020 04/30/2020  Height 5' 0.5" - -  Weight 176 lbs 3 oz 174 lbs -  BMI 33.83 - -  Systolic 128 123 786  Diastolic 77 88 60  Pulse 83 - 76   Exam chaperoned by Harlene Salts, CMA Gen: Alert, well appearing.  Patient is oriented to person, place, time, and situation. AFFECT: pleasant, lucid thought and speech. ENT: Ears: EACs clear, normal epithelium.  TMs with good light reflex and landmarks bilaterally.  Eyes: no injection,  icteris, swelling, or exudate.  EOMI, PERRLA. Nose: no drainage or turbinate edema/swelling.  No injection or focal lesion.  Mouth: lips without lesion/swelling.  Oral mucosa pink and moist.  Dentition intact and without obvious caries or gingival swelling.  Oropharynx without erythema, exudate, or swelling.  Neck: supple/nontender.  No LAD, mass, or TM.  Carotid pulses 2+ bilaterally, without bruits. CV: RRR, no m/r/g.   LUNGS: CTA bilat, nonlabored resps, good aeration in all lung fields. ABD: soft, NT, ND, BS normal.  No hepatospenomegaly or mass.  No bruits. EXT: no clubbing, cyanosis, or edema.  Musculoskeletal: no joint swelling, erythema, warmth, or tenderness.  ROM of all joints intact. Skin - no sores or suspicious lesions or rashes or color changes   Pertinent labs:  Lab Results  Component Value Date   TSH 0.69 02/20/2012   Lab Results  Component Value Date   WBC 8.0 04/26/2020   HGB 13.2 04/26/2020   HCT 40.5 04/26/2020   MCV 87.3 04/26/2020   PLT 306 04/26/2020   Lab Results  Component Value Date   CREATININE 0.48 08/31/2016   BUN 10 08/31/2016   NA 136 08/31/2016   K 3.7 08/31/2016   CL 110 08/31/2016   CO2 19 (L) 08/31/2016   Lab Results  Component Value Date   ALT 22 02/20/2012   AST 22 02/20/2012   ALKPHOS 68 02/20/2012   BILITOT 0.2 (L) 02/20/2012   Lab Results  Component Value Date   CHOL 170 02/20/2012   Lab Results  Component Value Date   HDL 37.20 (L) 02/20/2012   Lab Results  Component Value Date   LDLCALC 116 (H) 02/20/2012   Lab Results  Component Value Date   TRIG 82.0 02/20/2012   Lab Results  Component Value Date   CHOLHDL 5 02/20/2012   Lab Results  Component Value Date   HGBA1C 5.3 12/09/2018    ASSESSMENT AND PLAN:   1) GAD and PMDD: Improved but some emotional blunting and most of all she has significant impairment in libido and also inability to have orgasm since getting on paxil 20mg .  Discussed options of changing to  fluox or sertraline but decided on dec paxil to 1/2 of 20mg  tab per day.  2) Health maintenance exam: Reviewed age and gender appropriate health maintenance issues (prudent diet, regular exercise, health risks of tobacco and excessive alcohol, use of seatbelts, fire alarms in home, use of sunscreen).  Also reviewed age and gender appropriate health screening as well as vaccine recommendations. Vaccines: Tdap UTD.  Covid 19->still thinking about this one.  Flu -->declines. Labs: HP labs ordered--not fasting today->return for fasting lab appt. Cervical ca screening: per GYN MD-->most recent was approx 09/2019. Breast ca screening: start annual screening mammograms at age 31 yrs. Colon ca screening: average risk patient= as per latest guidelines, start screening at 63 yrs of age.  An After Visit Summary was printed and given to the patient.  FOLLOW UP:  Return in about 4 weeks (around 09/27/2020) for f/u GAD/PMDD.  Signed:  Santiago Bumpers, MD           08/30/2020

## 2020-08-30 NOTE — Patient Instructions (Signed)
Start taking 1/2 of your 20mg  paroxetine (paxil) tab once a day   Health Maintenance, Female Adopting a healthy lifestyle and getting preventive care are important in promoting health and wellness. Ask your health care provider about:  The right schedule for you to have regular tests and exams.  Things you can do on your own to prevent diseases and keep yourself healthy. What should I know about diet, weight, and exercise? Eat a healthy diet   Eat a diet that includes plenty of vegetables, fruits, low-fat dairy products, and lean protein.  Do not eat a lot of foods that are high in solid fats, added sugars, or sodium. Maintain a healthy weight Body mass index (BMI) is used to identify weight problems. It estimates body fat based on height and weight. Your health care provider can help determine your BMI and help you achieve or maintain a healthy weight. Get regular exercise Get regular exercise. This is one of the most important things you can do for your health. Most adults should:  Exercise for at least 150 minutes each week. The exercise should increase your heart rate and make you sweat (moderate-intensity exercise).  Do strengthening exercises at least twice a week. This is in addition to the moderate-intensity exercise.  Spend less time sitting. Even light physical activity can be beneficial. Watch cholesterol and blood lipids Have your blood tested for lipids and cholesterol at 34 years of age, then have this test every 5 years. Have your cholesterol levels checked more often if:  Your lipid or cholesterol levels are high.  You are older than 34 years of age.  You are at high risk for heart disease. What should I know about cancer screening? Depending on your health history and family history, you may need to have cancer screening at various ages. This may include screening for:  Breast cancer.  Cervical cancer.  Colorectal cancer.  Skin cancer.  Lung  cancer. What should I know about heart disease, diabetes, and high blood pressure? Blood pressure and heart disease  High blood pressure causes heart disease and increases the risk of stroke. This is more likely to develop in people who have high blood pressure readings, are of African descent, or are overweight.  Have your blood pressure checked: ? Every 3-5 years if you are 34-18 years of age. ? Every year if you are 26 years old or older. Diabetes Have regular diabetes screenings. This checks your fasting blood sugar level. Have the screening done:  Once every three years after age 34 if you are at a normal weight and have a low risk for diabetes.  More often and at a younger age if you are overweight or have a high risk for diabetes. What should I know about preventing infection? Hepatitis B If you have a higher risk for hepatitis B, you should be screened for this virus. Talk with your health care provider to find out if you are at risk for hepatitis B infection. Hepatitis C Testing is recommended for:  Everyone born from [birth year] through 1965.  Anyone with known risk factors for hepatitis C. Sexually transmitted infections (STIs)  Get screened for STIs, including gonorrhea and chlamydia, if: ? You are sexually active and are younger than 34 years of age. ? You are older than 34 years of age and your health care provider tells you that you are at risk for this type of infection. ? Your sexual activity has changed since you were last screened, and you  are at increased risk for chlamydia or gonorrhea. Ask your health care provider if you are at risk.  Ask your health care provider about whether you are at high risk for HIV. Your health care provider may recommend a prescription medicine to help prevent HIV infection. If you choose to take medicine to prevent HIV, you should first get tested for HIV. You should then be tested every 3 months for as long as you are taking the  medicine. Pregnancy  If you are about to stop having your period (premenopausal) and you may become pregnant, seek counseling before you get pregnant.  Take 400 to 800 micrograms (mcg) of folic acid every day if you become pregnant.  Ask for birth control (contraception) if you want to prevent pregnancy. Osteoporosis and menopause Osteoporosis is a disease in which the bones lose minerals and strength with aging. This can result in bone fractures. If you are 34 years old or older, or if you are at risk for osteoporosis and fractures, ask your health care provider if you should:  Be screened for bone loss.  Take a calcium or vitamin D supplement to lower your risk of fractures.  Be given hormone replacement therapy (HRT) to treat symptoms of menopause. Follow these instructions at home: Lifestyle  Do not use any products that contain nicotine or tobacco, such as cigarettes, e-cigarettes, and chewing tobacco. If you need help quitting, ask your health care provider.  Do not use street drugs.  Do not share needles.  Ask your health care provider for help if you need support or information about quitting drugs. Alcohol use  Do not drink alcohol if: ? Your health care provider tells you not to drink. ? You are pregnant, may be pregnant, or are planning to become pregnant.  If you drink alcohol: ? Limit how much you use to 0-1 drink a day. ? Limit intake if you are breastfeeding.  Be aware of how much alcohol is in your drink. In the [place] one drink equals one 12 oz bottle of beer (355 mL), one 5 oz glass of wine (148 mL), or one 1 oz glass of hard liquor (44 mL). General instructions  Schedule regular health, dental, and eye exams.  Stay current with your vaccines.  Tell your health care provider if: ? You often feel depressed. ? You have ever been abused or do not feel safe at home. Summary  Adopting a healthy lifestyle and getting preventive care are important in  promoting health and wellness.  Follow your health care provider's instructions about healthy diet, exercising, and getting tested or screened for diseases.  Follow your health care provider's instructions on monitoring your cholesterol and blood pressure. This information is not intended to replace advice given to you by your health care provider. Make sure you discuss any questions you have with your health care provider. Document Revised: 11/27/2018 Document Reviewed: 11/27/2018 Elsevier Patient Education  2020 [employer].

## 2020-09-02 ENCOUNTER — Ambulatory Visit: Payer: 59

## 2020-09-13 ENCOUNTER — Other Ambulatory Visit: Payer: Self-pay | Admitting: Family Medicine

## 2020-10-04 ENCOUNTER — Telehealth: Payer: 59 | Admitting: Family Medicine

## 2020-10-04 NOTE — Progress Notes (Unsigned)
Virtual Visit via Video Note  I connected with pt on 10/04/20 at  9:30 AM EDT by a video enabled telemedicine application and verified that I am speaking with the correct person using two identifiers.  Location patient: home Location provider:work or home office Persons participating in the virtual visit: patient, provider  I discussed the limitations of evaluation and management by telemedicine and the availability of in person appointments. The patient expressed understanding and agreed to proceed.  Telemedicine visit is a necessity given the COVID-19 restrictions in place at the current time.  HPI: 34 y/o female being seen today for 1 mo f/u GAD and PMDD. A/P as of last visit: "1) GAD and PMDD: Improved but some emotional blunting and most of all she has significant impairment in libido and also inability to have orgasm since getting on paxil 20mg .  Discussed options of changing to fluox or sertraline but decided on dec paxil to 1/2 of 20mg  tab per day.  2) Health maintenance exam: Reviewed age and gender appropriate health maintenance issues (prudent diet, regular exercise, health risks of tobacco and excessive alcohol, use of seatbelts, fire alarms in home, use of sunscreen).  Also reviewed age and gender appropriate health screening as well as vaccine recommendations. Vaccines: Tdap UTD.  Covid 19->still thinking about this one.  Flu -->declines. Labs: HP labs ordered--not fasting today->return for fasting lab appt. Cervical ca screening: per GYN MD-->most recent was approx 09/2019. Breast ca screening: start annual screening mammograms at age 55 yrs. Colon ca screening: average risk patient= as per latest guidelines, start screening at 45 yrs of age."  INTERIM HX: ***  ROS: See pertinent positives and negatives per HPI.  Past Medical History:  Diagnosis Date  . Bicornate uterus    Renal u/s 11/2015 to eval for renal abnormalities: NORMAL  . GAD (generalized anxiety disorder)    . History of gestational diabetes 2017  . 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 Missed ab 04/29/2020  . PMDD (premenstrual dysphoric disorder)   . Recurrent depression (HCC)   . Seasonal allergic rhinitis   . Wears contact lenses     Past Surgical History:  Procedure Laterality Date  . CESAREAN SECTION N/A 09/01/2016   Procedure: CESAREAN SECTION;  Surgeon: 05/01/2020, MD;  Location: [facility]THING SU[facility];  Service: Obstetrics;  Laterality: N/A;  . CESAREAN SECTION N/A 02/28/2018   Procedure: REPEAT CESAREAN SECTION;  Surgeon: [facility], MD;  Location: [facility] BIRTHING SUITES;  Service: Obstetrics;  Laterality: N/A;  . DILATION AND EVACUATION N/A 10/27/2015   Procedure: DILATATION AND EVACUATION ULTRASOUND;  Surgeon: [facility], MD;  Location: [facility];  Service: Gynecology;  Laterality: N/A;  Ultrasound called on 10/26/15 by Marlow Baars RN  . DILATION AND EVACUATION N/A 06/02/2019   Procedure: DILATATION AND EVACUATION;  Surgeon: Donne Hazel, MD;  Location: [facility] [place];  Service: Gynecology;  Laterality: N/A;  . DILATION AND EVACUATION N/A 04/30/2020   Procedure: DILATATION AND EVACUATION;  Surgeon: [facility], MD;  Location: [facility] [place];  Service: Gynecology;  Laterality: N/A;  with ultrasound guidance  . OPERATIVE ULTRASOUND N/A 04/30/2020   Procedure: OPERATIVE ULTRASOUND;  Surgeon: [facility], MD;  Location: [facility];  Service: Gynecology;  Laterality: N/A;  . WISDOM TOOTH EXTRACTION       Current Outpatient Medications:  .  Ascorbic Acid (VITAMIN C ADULT GUMMIES PO), Take by mouth., Disp: , Rfl:  .  ELDERBERRY PO, Take by mouth., Disp: , Rfl:  .  PARoxetine (PAXIL) 20 MG tablet, TAKE 1/2 TABLET BY MOUTH ONE TIME DAILY, Disp: 30 tablet, Rfl: 0 .  Prenatal Vit-Fe Fumarate-FA (PRENATAL MULTIVITAMIN) TABS tablet, Take 1 tablet by mouth at bedtime., Disp: , Rfl:    EXAM:  VITALS per patient if applicable:  Vitals with BMI 08/30/2020 07/14/2020 04/30/2020  Height 5' 0.5" - -  Weight 176 lbs 3 oz 174 lbs -  BMI 33.83 - -  Systolic 128 123 213  Diastolic 77 88 60  Pulse 83 - 76     GENERAL: alert, oriented, appears well and in no acute distress  HEENT: atraumatic, conjunttiva clear, no obvious abnormalities on inspection of external nose and ears  NECK: normal movements of the head and neck  LUNGS: on inspection no signs of respiratory distress, breathing rate appears normal, no obvious gross SOB, gasping or wheezing  CV: no obvious cyanosis  MS: moves all visible extremities without noticeable abnormality  PSYCH/NEURO: pleasant and cooperative, no obvious depression or anxiety, speech and thought processing grossly intact  LABS: none today    Chemistry      Component Value Date/Time   NA 136 08/31/2016 1150   K 3.7 08/31/2016 1150   CL 110 08/31/2016 1150   CO2 19 (L) 08/31/2016 1150   BUN 10 08/31/2016 1150   CREATININE 0.48 08/31/2016 1150      Component Value Date/Time   CALCIUM 8.9 08/31/2016 1150   ALKPHOS 68 02/20/2012 0941   AST 22 02/20/2012 0941   ALT 22 02/20/2012 0941   BILITOT 0.2 (L) 02/20/2012 0941      ASSESSMENT AND PLAN:  Discussed the following assessment and plan:  No diagnosis found.  -we discussed possible serious and likely etiologies, options for evaluation and workup, limitations of telemedicine visit vs in person visit, treatment, treatment risks and precautions. Pt prefers to treat via telemedicine empirically rather than in person at this moment.    I discussed the assessment and treatment plan with the patient. The patient was provided an opportunity to ask questions and all were answered. The patient agreed with the plan and demonstrated an understanding of the instructions.   F/u: *** Signed:  Santiago Bumpers, MD           10/04/2020

## 2020-10-06 ENCOUNTER — Other Ambulatory Visit: Payer: Self-pay | Admitting: Family Medicine

## 2020-10-15 ENCOUNTER — Other Ambulatory Visit: Payer: Self-pay | Admitting: Family Medicine

## 2020-10-15 ENCOUNTER — Telehealth: Payer: Self-pay | Admitting: Family Medicine

## 2020-10-15 MED ORDER — PAROXETINE HCL 20 MG PO TABS
ORAL_TABLET | ORAL | 1 refills | Status: DC
Start: 2020-10-15 — End: 2020-11-23

## 2020-10-15 NOTE — Telephone Encounter (Signed)
Paroxetine 20mg  once a day eRx'd today to [facility]. Needs f/u anx/dep in 1-2 mo.

## 2020-10-15 NOTE — Telephone Encounter (Signed)
LM for pt to return call. During appt on 9/13, she was advised to "start taking 1/2 of your 20mg  paroxetine (paxil) tab once a day"

## 2020-10-15 NOTE — Telephone Encounter (Signed)
Patient states she has been taking Paxil 1 tab per day (prescription was for 1/2 per day). She would like her RX changed to 1 per day and needs a refill because she is out already and current RX prevents refill this soon.

## 2020-10-15 NOTE — Telephone Encounter (Signed)
Patient advised refill available. Offered to make f/u appt for 1-2 months as recommended by PCP. Appt scheduled for 12/29

## 2020-10-15 NOTE — Telephone Encounter (Signed)
Patient has been taking 1 tab by mouth since last visit on 9/13. Does she need to continue this or still change to 1/2 tab as recommended? She will need new Rx sent to pharmacy because she only has 2 tabs left.  Please advise, thanks.

## 2020-11-18 ENCOUNTER — Telehealth: Payer: Self-pay | Admitting: Family Medicine

## 2020-11-18 NOTE — Telephone Encounter (Signed)
Patient would like to speak with someone about side effects from her Paxil. Please call patient to advise.

## 2020-11-18 NOTE — Telephone Encounter (Signed)
Spoke with patient and is currently experiencing more depressive episodes, crying spells, and wanting to sleep. Current dose she is taking is 1/2 tab as directed. Would like to know what to do if could have sooner appt. Offered to check and if possible, move appt up sooner.

## 2020-11-23 ENCOUNTER — Encounter: Payer: Self-pay | Admitting: Family Medicine

## 2020-11-23 ENCOUNTER — Telehealth (INDEPENDENT_AMBULATORY_CARE_PROVIDER_SITE_OTHER): Payer: 59 | Admitting: Family Medicine

## 2020-11-23 DIAGNOSIS — F331 Major depressive disorder, recurrent, moderate: Secondary | ICD-10-CM | POA: Diagnosis not present

## 2020-11-23 MED ORDER — PAROXETINE HCL 20 MG PO TABS
ORAL_TABLET | ORAL | 1 refills | Status: DC
Start: 2020-11-23 — End: 2021-08-15

## 2020-11-23 NOTE — Progress Notes (Signed)
Virtual Visit via Video Note  I connected with pt on 11/23/20 at 10:00 AM EST by a video enabled telemedicine application and verified that I am speaking with the correct person using two identifiers.  Location patient: home, [place] Location provider:work or home office Persons participating in the virtual visit: patient, provider  I discussed the limitations of evaluation and management by telemedicine and the availability of in person appointments. The patient expressed understanding and agreed to proceed.   HPI: 34 y/o female being seen today for f/u GAD and hx of MDD. I last saw her almost 3 months ago. A/P as of that visit: "1) GAD and PMDD: Improved but some emotional blunting and most of all she has significant impairment in libido and also inability to have orgasm since getting on paxil 20mg .  Discussed options of changing to fluox or sertraline but decided on dec paxil to 1/2 of 20mg  tab per day.  2) Health maintenance exam: Reviewed age and gender appropriate health maintenance issues (prudent diet, regular exercise, health risks of tobacco and excessive alcohol, use of seatbelts, fire alarms in home, use of sunscreen).  Also reviewed age and gender appropriate health screening as well as vaccine recommendations. Vaccines: Tdap UTD.  Covid 19->still thinking about this one.  Flu -->declines. Labs: HP labs ordered--not fasting today->return for fasting lab appt. Cervical ca screening: per GYN MD-->most recent was approx 09/2019. Breast ca screening: start annual screening mammograms at age 7 yrs. Colon ca screening: average risk patient= as per latest guidelines, start screening at 37 yrs of age."  INTERIM HX: Not doing well the last 3 wks or so. Depressed, anhedonia, poor motivation, crying spells. Grieving the fact that she was supposed to be having her baby right now. Has been taking the paxil WHOLE 20mg  per day dosing instead of half  so she ran out of med early, then switched  to 1/2 tab qd about 1 mo ago. Had to return to work in [place] recently and two coworkers have new babies and this has her feeling more upset as well.  Lots of feelings she doesn't know how to deal with.   Tearful during interview today.   No SI but "wonder if it would be worse if I wasn't here". Has been seeing a 54" at work.  Husband is supportive.  ROS: See pertinent positives and negatives per HPI.  Past Medical History:  Diagnosis Date  . Bicornate uterus    Renal u/s 11/2015 to eval for renal abnormalities: NORMAL  . GAD (generalized anxiety disorder)   . History of gestational diabetes 2017  . 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 Missed ab 04/29/2020  . PMDD (premenstrual dysphoric disorder)   . Recurrent depression (HCC)   . Seasonal allergic rhinitis   . Wears contact lenses     Past Surgical History:  Procedure Laterality Date  . CESAREAN SECTION N/A 09/01/2016   Procedure: CESAREAN SECTION;  Surgeon: 2021, MD;  Location: [facility] BIRTHING SUITES;  Service: Obstetrics;  Laterality: N/A;  . CESAREAN SECTION N/A 02/28/2018   Procedure: REPEAT CESAREAN SECTION;  Surgeon: Marlow Baars, MD;  Location: [facility] BIRTHING SUIT[facility];  Service: Obstetrics;  Laterality: N/A;  . DILATION AND EVACUATION N/A 10/27/2015   Procedure: DILATATION AND EVACUATION ULTRASOUND;  Surgeon: [name], MD;  Location: [facility];  Service: Gynecology;  Laterality: N/A;  Ultrasound called on 10/26/15 by 13/08/2015 RN  . DILATION AND EVACUATION  N/A 06/02/2019   Procedure: DILATATION AND EVACUATION;  Surgeon: Marlow Baars, MD;  Location: [facility];  Service: Gynecology;  Laterality: N/A;  . DILATION AND EVACUATION N/A 04/30/2020   Procedure: DILATATION AND EVACUATION;  Surgeon: Marlow Baars, MD;  Location: [facility];  Service: Gynecology;  Laterality: N/A;  with ultrasound guidance  . OPERATIVE ULTRASOUND  N/A 04/30/2020   Procedure: OPERATIVE ULTRASOUND;  Surgeon: Marlow Baars, MD;  Location: [facility];  Service: Gynecology;  Laterality: N/A;  . WISDOM TOOTH EXTRACTION       Current Outpatient Medications:  .  Ascorbic Acid (VITAMIN C ADULT GUMMIES PO), Take by mouth., Disp: , Rfl:  .  ELDERBERRY PO, Take by mouth., Disp: , Rfl:  .  PARoxetine (PAXIL) 20 MG tablet, TAKE ONE TABLET BY MOUTH DAILY, Disp: 30 tablet, Rfl: 1  EXAM:  VITALS per patient if applicable:  GENERAL: alert, oriented, appears well and in no acute distress  HEENT: atraumatic, conjunttiva clear, no obvious abnormalities on inspection of external nose and ears  NECK: normal movements of the head and neck  LUNGS: on inspection no signs of respiratory distress, breathing rate appears normal, no obvious gross SOB, gasping or wheezing  CV: no obvious cyanosis  MS: moves all visible extremities without noticeable abnormality  PSYCH/NEURO: pleasant and cooperative, no obvious depression or anxiety, speech and thought processing grossly intact  ASSESSMENT AND PLAN:  Discussed the following assessment and plan:  MDD, recurrent. Recent worsening shortly after cutting her dose of paxil to HALF of her 20mg  tab. Plan is to inc back to 20mg  qd paxil. Encouraged her to continue counseling through her church. Gave emotional support the best I could today regarding what she is dealing with.   I discussed the assessment and treatment plan with the patient. The patient was provided an opportunity to ask questions and all were answered. The patient agreed with the plan and demonstrated an understanding of the instructions.   F/u: 1 mo  Signed:  , MD           11/23/2020

## 2020-12-14 ENCOUNTER — Telehealth: Payer: 59 | Admitting: Family Medicine

## 2020-12-15 ENCOUNTER — Telehealth: Payer: 59 | Admitting: Family Medicine

## 2020-12-18 DIAGNOSIS — O24419 Gestational diabetes mellitus in pregnancy, unspecified control: Secondary | ICD-10-CM

## 2020-12-18 HISTORY — DX: Gestational diabetes mellitus in pregnancy, unspecified control: O24.419

## 2021-05-05 LAB — OB RESULTS CONSOLE HEPATITIS B SURFACE ANTIGEN: Hepatitis B Surface Ag: NEGATIVE

## 2021-05-05 LAB — OB RESULTS CONSOLE HIV ANTIBODY (ROUTINE TESTING): HIV: NONREACTIVE

## 2021-05-05 LAB — HEMOGLOBIN A1C: Hemoglobin A1C: 5.6

## 2021-05-05 LAB — OB RESULTS CONSOLE RPR: RPR: NONREACTIVE

## 2021-05-05 LAB — OB RESULTS CONSOLE RUBELLA ANTIBODY, IGM: Rubella: IMMUNE

## 2021-05-05 LAB — HEPATITIS C ANTIBODY: HCV Ab: NEGATIVE

## 2021-05-05 LAB — HM HIV SCREENING LAB: HM HIV Screening: NEGATIVE

## 2021-06-17 ENCOUNTER — Other Ambulatory Visit: Payer: Self-pay

## 2021-06-17 ENCOUNTER — Other Ambulatory Visit: Payer: Self-pay | Admitting: Nurse Practitioner

## 2021-06-17 ENCOUNTER — Other Ambulatory Visit: Payer: Self-pay | Admitting: Obstetrics

## 2021-06-17 DIAGNOSIS — Z3A16 16 weeks gestation of pregnancy: Secondary | ICD-10-CM

## 2021-06-17 DIAGNOSIS — Z363 Encounter for antenatal screening for malformations: Secondary | ICD-10-CM

## 2021-07-05 ENCOUNTER — Encounter: Payer: Self-pay | Admitting: *Deleted

## 2021-07-08 ENCOUNTER — Other Ambulatory Visit: Payer: Self-pay

## 2021-07-08 ENCOUNTER — Ambulatory Visit: Payer: 59 | Admitting: *Deleted

## 2021-07-08 ENCOUNTER — Other Ambulatory Visit: Payer: Self-pay | Admitting: *Deleted

## 2021-07-08 ENCOUNTER — Ambulatory Visit: Payer: 59 | Attending: Obstetrics and Gynecology

## 2021-07-08 ENCOUNTER — Encounter: Payer: Self-pay | Admitting: *Deleted

## 2021-07-08 VITALS — BP 106/62 | HR 80

## 2021-07-08 DIAGNOSIS — Z6833 Body mass index (BMI) 33.0-33.9, adult: Secondary | ICD-10-CM | POA: Insufficient documentation

## 2021-07-08 DIAGNOSIS — Z363 Encounter for antenatal screening for malformations: Secondary | ICD-10-CM

## 2021-07-08 DIAGNOSIS — Z3A16 16 weeks gestation of pregnancy: Secondary | ICD-10-CM

## 2021-07-08 DIAGNOSIS — Z362 Encounter for other antenatal screening follow-up: Secondary | ICD-10-CM

## 2021-08-05 ENCOUNTER — Other Ambulatory Visit: Payer: Self-pay

## 2021-08-05 ENCOUNTER — Ambulatory Visit: Payer: 59 | Admitting: *Deleted

## 2021-08-05 ENCOUNTER — Other Ambulatory Visit: Payer: Self-pay | Admitting: *Deleted

## 2021-08-05 ENCOUNTER — Encounter: Payer: Self-pay | Admitting: *Deleted

## 2021-08-05 ENCOUNTER — Ambulatory Visit: Payer: 59 | Attending: Obstetrics and Gynecology

## 2021-08-05 VITALS — BP 114/60 | HR 81

## 2021-08-05 DIAGNOSIS — O34592 Maternal care for other abnormalities of gravid uterus, second trimester: Secondary | ICD-10-CM | POA: Diagnosis not present

## 2021-08-05 DIAGNOSIS — Q513 Bicornate uterus: Secondary | ICD-10-CM | POA: Diagnosis not present

## 2021-08-05 DIAGNOSIS — O99212 Obesity complicating pregnancy, second trimester: Secondary | ICD-10-CM | POA: Diagnosis not present

## 2021-08-05 DIAGNOSIS — Z3A23 23 weeks gestation of pregnancy: Secondary | ICD-10-CM

## 2021-08-05 DIAGNOSIS — Z6833 Body mass index (BMI) 33.0-33.9, adult: Secondary | ICD-10-CM

## 2021-08-05 DIAGNOSIS — Z362 Encounter for other antenatal screening follow-up: Secondary | ICD-10-CM

## 2021-08-05 DIAGNOSIS — O09212 Supervision of pregnancy with history of pre-term labor, second trimester: Secondary | ICD-10-CM

## 2021-08-05 DIAGNOSIS — E669 Obesity, unspecified: Secondary | ICD-10-CM | POA: Diagnosis not present

## 2021-08-05 DIAGNOSIS — O321XX0 Maternal care for breech presentation, not applicable or unspecified: Secondary | ICD-10-CM

## 2021-08-05 DIAGNOSIS — Z8632 Personal history of gestational diabetes: Secondary | ICD-10-CM

## 2021-08-05 DIAGNOSIS — O3402 Maternal care for unspecified congenital malformation of uterus, second trimester: Secondary | ICD-10-CM

## 2021-08-05 DIAGNOSIS — O34219 Maternal care for unspecified type scar from previous cesarean delivery: Secondary | ICD-10-CM

## 2021-08-11 ENCOUNTER — Other Ambulatory Visit: Payer: Self-pay

## 2021-08-11 ENCOUNTER — Emergency Department (HOSPITAL_COMMUNITY)
Admission: EM | Admit: 2021-08-11 | Discharge: 2021-08-11 | Disposition: A | Discharge status: Home / Self Care | Payer: 59 | Attending: Emergency Medicine | Admitting: Emergency Medicine

## 2021-08-11 ENCOUNTER — Encounter (HOSPITAL_COMMUNITY): Payer: Self-pay

## 2021-08-11 DIAGNOSIS — O4692 Antepartum hemorrhage, unspecified, second trimester: Secondary | ICD-10-CM | POA: Diagnosis not present

## 2021-08-11 DIAGNOSIS — Z3A24 24 weeks gestation of pregnancy: Secondary | ICD-10-CM

## 2021-08-11 DIAGNOSIS — O26892 Other specified pregnancy related conditions, second trimester: Secondary | ICD-10-CM | POA: Diagnosis present

## 2021-08-11 DIAGNOSIS — R109 Unspecified abdominal pain: Secondary | ICD-10-CM | POA: Diagnosis not present

## 2021-08-11 DIAGNOSIS — O99891 Other specified diseases and conditions complicating pregnancy: Secondary | ICD-10-CM | POA: Diagnosis not present

## 2021-08-11 DIAGNOSIS — O4702 False labor before 37 completed weeks of gestation, second trimester: Secondary | ICD-10-CM

## 2021-08-11 DIAGNOSIS — M549 Dorsalgia, unspecified: Secondary | ICD-10-CM | POA: Diagnosis not present

## 2021-08-11 DIAGNOSIS — Z3A25 25 weeks gestation of pregnancy: Secondary | ICD-10-CM | POA: Diagnosis not present

## 2021-08-11 DIAGNOSIS — O09292 Supervision of pregnancy with other poor reproductive or obstetric history, second trimester: Secondary | ICD-10-CM | POA: Insufficient documentation

## 2021-08-11 DIAGNOSIS — Z79899 Other long term (current) drug therapy: Secondary | ICD-10-CM | POA: Insufficient documentation

## 2021-08-11 DIAGNOSIS — R319 Hematuria, unspecified: Secondary | ICD-10-CM

## 2021-08-11 DIAGNOSIS — O4700 False labor before 37 completed weeks of gestation, unspecified trimester: Secondary | ICD-10-CM

## 2021-08-11 LAB — URINALYSIS, ROUTINE W REFLEX MICROSCOPIC
Bilirubin Urine: NEGATIVE
Glucose, UA: NEGATIVE mg/dL
Ketones, ur: 5 mg/dL — AB
Leukocytes,Ua: NEGATIVE
Nitrite: NEGATIVE
Protein, ur: NEGATIVE mg/dL
Specific Gravity, Urine: 1.004 — ABNORMAL LOW (ref 1.005–1.030)
pH: 7 (ref 5.0–8.0)

## 2021-08-11 MED ORDER — NIFEDIPINE 10 MG PO CAPS
10.0000 mg | ORAL_CAPSULE | ORAL | Status: DC | PRN
  Administered 2021-08-11 (×3): 10 mg via ORAL
  Filled 2021-08-11 (×3): qty 1

## 2021-08-11 NOTE — MAU Provider Note (Signed)
Chief Complaint:  Vaginal Bleeding   Event Date/Time   First Provider Initiated Contact with Patient 08/11/21 2036     HPI: Sandra Bauer is a 35 y.o. [id number] at 24w4dwho presents to maternity admissions reporting vaginal bleeding this evening.  Has had some tightening with pain which starts on sides and wraps around to front. History of preterm rupture at 36 wks with last baby and bicornuate uterus. She reports good fetal movement, denies LOF, vaginal bleeding, vaginal itching/burning, urinary symptoms, h/a, dizziness, n/v, diarrhea, constipation or fever/chills.  She denies headache, visual changes or RUQ abdominal pain.  Vaginal Bleeding The patient's primary symptoms include pelvic pain and vaginal bleeding. The patient's pertinent negatives include no genital itching, genital lesions or genital odor. This is a new problem. The current episode started today. The problem has been resolved. The pain is mild. She is pregnant. Associated symptoms include abdominal pain and back pain. Pertinent negatives include no constipation, diarrhea, dysuria, fever or headaches. The vaginal discharge was bloody. The vaginal bleeding is lighter than menses. She has not been passing clots. She has not been passing tissue. Nothing aggravates the symptoms. She has tried nothing for the symptoms.   RN Note: Pt  is [redacted]wks pregnant. Pt states her abdomen has felt tight for past few days. Pt has flank pain intermittently. Pt went to urinate and when she wiped, she noted blood. Pt has had 3 miscarriages prior to this pregnancy. Pt has been having a lot of stress recently. 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/ 2016;  06/ 2020; 04/2020   06/ 2020  molar pregnancy   Missed ab 04/29/2020   PMDD (premenstrual dysphoric disorder)    Recurrent depression (HCC)    Seasonal allergic rhinitis    Wears contact lenses     Past obstetric history: OB History  Gravida Para Term Preterm AB Living  6 2 1 1 2 2   SAB IAB Ectopic Multiple Live Births  1     0 2    # Outcome Date GA Lbr Len/2nd Weight Sex Delivery Anes PTL Lv  6 Current           5 Preterm 02/28/18 [redacted]w[redacted]d  3200 g M CS-LTranv Spinal  LIV  4 Term 09/01/16 [redacted]w[redacted]d  3155 g F CS-LTranv Spinal  LIV  3 Molar           2 Gravida           1 SAB             Past Surgical History: Past Surgical History:  Procedure Laterality Date   CESAREAN SECTION N/A 09/01/2016   Procedure: CESAREAN SECTION;  Surgeon: 09/03/2016, MD;  Location: [facility];  Service: Obstetrics;  Laterality: N/A;   CESAREAN SECTION N/A 02/28/2018   Procedure: REPEAT CESAREAN SECTION;  Surgeon: 03/02/2018, MD;  Location: [facility];  Service: Obstetrics;  Laterality: N/A;   DILATION AND EVACUATION N/A 10/27/2015   Procedure: DILATATION AND EVACUATION ULTRASOUND;  Surgeon: 13/08/2015, MD;  Location: [facility];  Service: Gynecology;  Laterality: N/A;  Ultrasound called on 10/26/15 by 13/08/16 RN   DILATION AND EVACUATION N/A 06/02/2019   Procedure: DILATATION  AND EVACUATION;  Surgeon: Marlow Baars, MD;  Location: [place];  Service: Gynecology;  Laterality: N/A;   DILATION AND EVACUATION N/A 04/30/2020   Procedure: DILATATION AND EVACUATION;  Surgeon: Marlow Baars, MD;  Location: [facility];  Service: Gynecology;  Laterality: N/A;  with ultrasound guidance   OPERATIVE ULTRASOUND N/A 04/30/2020   Procedure: OPERATIVE ULTRASOUND;  Surgeon: Marlow Baars, MD;  Location: [facility] [place];  Service: Gynecology;  Laterality: N/A;   WISDOM TOOTH EXTRACTION      Family History: Family History  Problem Relation Age of Onset   Cancer Maternal Grandfather         "stomach"   Diabetes Paternal Grandfather    Stroke Paternal Grandfather     Social History: Social History   Tobacco Use   Smoking status: Never   Smokeless tobacco: Never  Vaping Use   Vaping Use: Never used  Substance Use Topics   Alcohol use: No   Drug use: Never    Allergies:  Allergies  Allergen Reactions   Nuvaring [Etonogestrel-Ethinyl Estradiol]     Vaginal burning, HA, bloating, mood change, nose bleed    Meds:  Medications Prior to Admission  Medication Sig Dispense Refill Last Dose   Prenatal Vit-Fe Fumarate-FA (PRENATAL MULTIVITAMIN) TABS tablet Take 1 tablet by mouth daily at 12 noon.   08/10/2021   Ascorbic Acid (VITAMIN C ADULT GUMMIES PO) Take by mouth.      ELDERBERRY PO Take by mouth. (Patient not taking: Reported on 07/08/2021)      PARoxetine (PAXIL) 20 MG tablet TAKE ONE TABLET BY MOUTH DAILY (Patient not taking: Reported on 07/08/2021) 30 tablet 1     I have reviewed patient's Past Medical Hx, Surgical Hx, Family Hx, Social Hx, medications and allergies.   ROS:  Review of Systems  Constitutional:  Negative for fever.  Gastrointestinal:  Positive for abdominal pain. Negative for constipation and diarrhea.  Genitourinary:  Positive for pelvic pain and vaginal bleeding. Negative for dysuria.  Musculoskeletal:  Positive for back pain.  Neurological:  Negative for headaches.  Other systems negative  Physical Exam  Patient Vitals for the past 24 hrs:  BP Temp Temp src Pulse Resp SpO2 Height Weight  08/11/21 2035 -- -- -- -- -- 100 % -- --  08/11/21 2033 117/72 -- -- 88 -- -- -- --  08/11/21 2021 117/67 -- -- 99 18 -- 5\' 1"  (1.549 m) 81.2 kg  08/11/21 1941 -- -- -- -- -- -- 5\' 1"  (1.549 m) 79.4 kg  08/11/21 1940 132/87 98.6 F (37 C) Oral (!) 108 16 100 % -- --   Vitals:   08/11/21 2021 08/11/21 2033 08/11/21 2035 08/11/21 2158  BP: 117/67 117/72  122/67  Pulse: 99 88  82  Resp: 18   17  Temp:      TempSrc:      SpO2:   100%    Weight: 81.2 kg     Height: 5\' 1"  (1.549 m)       Constitutional: Well-developed, well-nourished female in no acute distress.  Cardiovascular: normal rate and rhythm Respiratory: normal effort, clear to auscultation bilaterally GI: Abd soft, non-tender, gravid appropriate for gestational age.   No rebound or guarding. MS: Extremities nontender, no edema, normal ROM Neurologic: Alert and oriented x 4.  GU: Neg CVAT.  PELVIC EXAM: Cervix pink, visually closed, without lesion, scant white creamy discharge, vaginal walls and external genitalia normal    No blood seen, no  colored discharge seen.   Cervix appears closed and long   FHT:  Baseline 140 , moderate variability, accelerations present, no decelerations Contractions: q 3 mins Irregular  Mild and only last 30 sec   Labs: Blood type O+   Results for orders placed or performed during the hospital encounter of 08/11/21 (from the past 24 hour(s))  Urinalysis, Routine w reflex microscopic Urine, Clean Catch     Status: Abnormal   Collection Time: 08/11/21  8:29 PM  Result Value Ref Range   Color, Urine STRAW (A) YELLOW   APPearance CLEAR CLEAR   Specific Gravity, Urine 1.004 (L) 1.005 - 1.030   pH 7.0 5.0 - 8.0   Glucose, UA NEGATIVE NEGATIVE mg/dL   Hgb urine dipstick MODERATE (A) NEGATIVE   Bilirubin Urine NEGATIVE NEGATIVE   Ketones, ur 5 (A) NEGATIVE mg/dL   Protein, ur NEGATIVE NEGATIVE mg/dL   Nitrite NEGATIVE NEGATIVE   Leukocytes,Ua NEGATIVE NEGATIVE   RBC / HPF 6-10 0 - 5 RBC/hpf   WBC, UA 0-5 0 - 5 WBC/hpf   Bacteria, UA RARE (A) NONE SEEN   Squamous Epithelial / LPF 0-5 0 - 5   Mucus PRESENT      Imaging:  US done on 8/19 by MFM describes posterior placenta and Cervix 3.19cm  MAU Course/MDM: I have ordered labs and reviewed results. UA clear except for hematuria.  Sent for culture NST reviewed, reactive Consult Dr Alysia Penna and Dr Claiborne Billings with presentation, exam findings and test results.   They recommend  discharge home with PTL/Bleeding precautions, pelvic rest, outpatient followup with Dr Chestine Spore Treatments in MAU included Procardia series which stopped contractions    Assessment: Single IUP at [redacted]w[redacted]d Vaginal bleeding, resolved Hematuria, urine to culture Preterm uterine contractions  Plan: Discharge home Bleeding precautions Preterm Labor precautions and fetal kick counts Follow up in Office for prenatal visits, call in AM to arrange followup  Encouraged to return if she develops worsening of symptoms, increase in pain, fever, or other concerning symptoms.  Pt stable at time of discharge.  Wynelle Bourgeois CNM, MSN Certified Nurse-Midwife 08/11/2021 8:37 PM

## 2021-08-11 NOTE — ED Triage Notes (Signed)
Pt  is [redacted]wks pregnant. Pt states her abdomen has felt tight for past few days. Pt has flank pain intermittently. Pt went to urinate and when she wiped, she noted blood. Pt has had 3 miscarriages prior to this pregnancy. Pt has been having a lot of stress recently. Pt states she has felt baby move while coming to hospital.

## 2021-08-11 NOTE — MAU Note (Signed)
Pt reports when she went to [facility] she had some vaginal bleeding like a period.c/o abd tightness and lower back pain . Stated she is feeling baby move.  Pt went to [facility] just now and no blood seen in underwear or when wiping

## 2021-08-11 NOTE — ED Provider Notes (Signed)
Emergency Medicine Provider OB Triage Evaluation Note  Sandra Bauer is a 34 y.o. female, [id number], at [redacted]w[redacted]d gestation who presents to the emergency department with complaints of vaginal bleeding.  Review of  Systems  Positive: abd pain, vaginal bleeding Negative: fever, dysuria  Physical Exam  BP 132/87 (BP Location: Left Arm)   Pulse (!) 108   Temp 98.6 F (37 C) (Oral)   Resp 16   Ht 5\' 1"  (1.549 m)   Wt 79.4 kg   LMP 02/20/2021   SpO2 100%   BMI 33.07 kg/m  General: Awake, no distress  HEENT: Atraumatic  Resp: Normal effort  Cardiac: Normal rate Abd: Nondistended, nontender  MSK: Moves all extremities without difficulty Neuro: Speech clear  Medical Decision Making  Pt evaluated for pregnancy concern and is stable for transfer to MAU. Pt is in agreement with plan for transfer.  7:48 PM Discussed with MAU APP, 04/22/2021, who accepts patient in transfer.  Clinical Impression  No diagnosis found.     Victorino Dike, PA-C 08/11/21 1949    08/13/21, MD 08/11/21 2130

## 2021-08-11 NOTE — ED Provider Notes (Signed)
Emergency Medicine Provider Triage Evaluation Note  Sandra Bauer , a 35 y.o. female  was evaluated in triage.  Pt complains of vaginal bleeding.  Review of Systems  Positive: Abd cramping, vaginal bleeding, pregnancy Negative: Fever, chills, dysuria  Physical Exam  BP 132/87 (BP Location: Left Arm)   Pulse (!) 108   Temp 98.6 F (37 C) (Oral)   Resp 16   Ht 5\' 1"  (1.549 m)   Wt 79.4 kg   LMP 02/20/2021   SpO2 100%   BMI 33.07 kg/m  Gen:   Awake, no distress   Resp:  Normal effort  MSK:   Moves extremities without difficulty  Other:  Gravid abdomen, no significant tenderness, minimal blood noted on pad  Medical Decision Making  Medically screening exam initiated at 7:47 PM.  Appropriate orders placed.  Sandra Bauer was informed that the remainder of the evaluation will be completed by another provider, this initial triage assessment does not replace that evaluation, and the importance of remaining in the ED until their evaluation is complete.  Pt is a Sandra Bauer who is [redacted] wks pregnant who develop abdominal cramping and vaginal bleeding tonight.     [id number], PA-C 08/11/21 1948    08/13/21, MD 08/11/21 2130

## 2021-08-13 LAB — CULTURE, OB URINE

## 2021-08-14 ENCOUNTER — Encounter (HOSPITAL_COMMUNITY): Payer: Self-pay | Admitting: Obstetrics

## 2021-08-14 ENCOUNTER — Other Ambulatory Visit: Payer: Self-pay

## 2021-08-14 ENCOUNTER — Inpatient Hospital Stay (HOSPITAL_COMMUNITY)
Admission: AD | Admit: 2021-08-14 | Discharge: 2021-08-15 | Disposition: A | Discharge status: Home / Self Care | Payer: 59 | Attending: Obstetrics | Admitting: Obstetrics

## 2021-08-14 DIAGNOSIS — R109 Unspecified abdominal pain: Secondary | ICD-10-CM | POA: Diagnosis not present

## 2021-08-14 DIAGNOSIS — S30814A Abrasion of vagina and vulva, initial encounter: Secondary | ICD-10-CM | POA: Diagnosis not present

## 2021-08-14 DIAGNOSIS — Z3A25 25 weeks gestation of pregnancy: Secondary | ICD-10-CM | POA: Insufficient documentation

## 2021-08-14 DIAGNOSIS — O4692 Antepartum hemorrhage, unspecified, second trimester: Secondary | ICD-10-CM | POA: Diagnosis not present

## 2021-08-14 DIAGNOSIS — X58XXXA Exposure to other specified factors, initial encounter: Secondary | ICD-10-CM | POA: Diagnosis not present

## 2021-08-14 DIAGNOSIS — O26892 Other specified pregnancy related conditions, second trimester: Secondary | ICD-10-CM | POA: Insufficient documentation

## 2021-08-14 DIAGNOSIS — O26899 Other specified pregnancy related conditions, unspecified trimester: Secondary | ICD-10-CM

## 2021-08-14 LAB — URINALYSIS, ROUTINE W REFLEX MICROSCOPIC
Bilirubin Urine: NEGATIVE
Glucose, UA: NEGATIVE mg/dL
Ketones, ur: NEGATIVE mg/dL
Leukocytes,Ua: NEGATIVE
Nitrite: NEGATIVE
Protein, ur: NEGATIVE mg/dL
Specific Gravity, Urine: 1.004 — ABNORMAL LOW (ref 1.005–1.030)
pH: 7 (ref 5.0–8.0)

## 2021-08-14 NOTE — MAU Note (Signed)
..  Sandra Bauer is a 35 y.o. at [redacted]w[redacted]d here in MAU reporting: Cramping and vaginal bleeding. Reports the cramping is similar to the last time she was here, where she was told she was having mild contractions. Vaginal bleeding that began again yesterday evening, that is there when she wipes, mixture of brown and red. Brighter as the day progresses.  Pain score: 8/10 Vitals:   08/14/21 2241  BP: 108/68  Pulse: (!) 101  Resp: 16  Temp: 98.8 F (37.1 C)  SpO2: 100%     FHT: unable to get in triage, moved to room adjusted patient and got FHR of 130's Lab orders placed from triage: UA

## 2021-08-14 NOTE — MAU Provider Note (Signed)
History     CSN: [id number]  Arrival date and time: 08/14/21 2227   Event Date/Time   First Provider Initiated Contact with Patient 08/14/21 2314      Chief Complaint  Patient presents with   Abdominal Pain   Vaginal Bleeding   HPI Sandra Bauer is a 35 y.o. [id number] at [redacted]w[redacted]d who presents with abdominal cramping and vaginal bleeding.  Came to MAU for same symptoms last Thursday.  Started again this morning.  Reports occasional Cramping throughout the day that is worse when she is up and walking around.  Cannot tell how frequently it is occurring but does not occur more than 6 times per hour.  Has noticed spotting when she wipes.  Starts out brown then turns to bright red.  Does not occur every time she uses the bathroom. Is not bleeding into a pad.  Denies nausea, vomiting, diarrhea, fever, flank pain, dysuria, abnormal discharge.  No recent intercourse or exam since her visit on Thursday.  Sees her OB on Tuesday.  Reports good fetal movement.  OB History     Gravida  6   Para  2   Term  1   Preterm  1   AB  3   Living  2      SAB  2   IAB      Ectopic      Multiple  0   Live Births  2           Past Medical History:  Diagnosis Date   Bicornate uterus    Renal u/s 11/2015 to eval for renal abnormalities: NORMAL   GAD (generalized anxiety disorder)    History of gestational diabetes 2017   PMDD (premenstrual dysphoric disorder)    Recurrent depression (HCC)    Seasonal allergic rhinitis    Wears contact lenses     Past Surgical History:  Procedure Laterality Date   CESAREAN SECTION N/A 09/01/2016   Procedure: CESAREAN SECTION;  Surgeon: Marlow Baars, MD;  Location: [facility];  Service: Obstetrics;  Laterality: N/A;   CESAREAN SECTION N/A 02/28/2018   Procedure: REPEAT CESAREAN SECTION;  Surgeon: [name], MD;  Location: [facility];  Service: Obstetrics;  Laterality: N/A;   DILATION AND EVACUATION N/A 10/27/2015   Procedure:  DILATATION AND EVACUATION ULTRASOUND;  Surgeon: Marlow Baars, MD;  Location: [facility];  Service: Gynecology;  Laterality: N/A;  Ultrasound called on 10/26/15 by Donne Hazel RN   DILATION AND EVACUATION N/A 06/02/2019   Procedure: DILATATION AND EVACUATION;  Surgeon: Marlow Baars, MD;  Location: [facility];  Service: Gynecology;  Laterality: N/A;   DILATION AND EVACUATION N/A 04/30/2020   Procedure: DILATATION AND EVACUATION;  Surgeon: Marlow Baars, MD;  Location: [facility];  Service: Gynecology;  Laterality: N/A;  with ultrasound guidance   OPERATIVE ULTRASOUND N/A 04/30/2020   Procedure: OPERATIVE ULTRASOUND;  Surgeon: Marlow Baars, MD;  Location: [facility] ;  Service: Gynecology;  Laterality: N/A;   WISDOM TOOTH EXTRACTION      Family History  Problem Relation Age of Onset   Cancer Maternal Grandfather        "stomach"   Diabetes Paternal Grandfather    Stroke Paternal Grandfather     Social History   Tobacco Use   Smoking status: Never   Smokeless tobacco: Never  Vaping Use   Vaping Use: Never used  Substance Use Topics   Alcohol use: No   Drug use: Never  Allergies:  Allergies  Allergen Reactions   Nuvaring [Etonogestrel-Ethinyl Estradiol]     Vaginal burning, HA, bloating, mood change, nose bleed    Medications Prior to Admission  Medication Sig Dispense Refill Last Dose   Ascorbic Acid (VITAMIN C ADULT GUMMIES PO) Take by mouth.   08/14/2021   Prenatal Vit-Fe Fumarate-FA (PRENATAL MULTIVITAMIN) TABS tablet Take 1 tablet by mouth daily at 12 noon.   08/14/2021   ELDERBERRY PO Take by mouth. (Patient not taking: Reported on 07/08/2021)      PARoxetine (PAXIL) 20 MG tablet TAKE ONE TABLET BY MOUTH DAILY (Patient not taking: Reported on 07/08/2021) 30 tablet 1     Review of Systems  Constitutional: Negative.   Gastrointestinal:  Positive for abdominal pain. Negative for diarrhea, nausea and vomiting.  Genitourinary:   Positive for vaginal bleeding. Negative for dysuria, flank pain, frequency, hematuria, urgency and vaginal discharge.  Musculoskeletal:  Positive for back pain.  Physical Exam   Blood pressure 108/68, pulse (!) 101, temperature 98.8 F (37.1 C), resp. rate 16, height 5\' 1"  (1.549 m), weight 81.9 kg, last menstrual period 02/20/2021, SpO2 100 %, unknown if currently breastfeeding.  Physical Exam Vitals and nursing note reviewed. Exam conducted with a chaperone present.  Constitutional:      General: She is not in acute distress.    Appearance: She is well-developed. She is not ill-appearing.  HENT:     Head: Normocephalic and atraumatic.  Eyes:     General: No scleral icterus. Pulmonary:     Effort: Pulmonary effort is normal. No respiratory distress.  Genitourinary:    Urethra: No prolapse, urethral swelling or urethral lesion.     Cervix: Normal.     Comments: Scattered tiny abrasions on labia minors on either side of urethra with scant active bleeding.  No blood in vagina. Cervix pink/smooth. Cervix closed/thick/ballotable.  Skin:    General: Skin is warm and dry.  Neurological:     Mental Status: She is alert.  Psychiatric:        Mood and Affect: Mood normal.        Behavior: Behavior normal.   NST:  Baseline: 140 bpm, Variability: Good {> 6 bpm), Accelerations: Reactive, Decelerations: Absent, and no contractions  MAU Course  Procedures Results for orders placed or performed during the hospital encounter of 08/14/21 (from the past 24 hour(s))  Urinalysis, Routine w reflex microscopic Urine, Clean Catch     Status: Abnormal   Collection Time: 08/14/21 11:14 PM  Result Value Ref Range   Color, Urine STRAW (A) YELLOW   APPearance CLEAR CLEAR   Specific Gravity, Urine 1.004 (L) 1.005 - 1.030   pH 7.0 5.0 - 8.0   Glucose, UA NEGATIVE NEGATIVE mg/dL   Hgb urine dipstick MODERATE (A) NEGATIVE   Bilirubin Urine NEGATIVE NEGATIVE   Ketones, ur NEGATIVE NEGATIVE mg/dL    Protein, ur NEGATIVE NEGATIVE mg/dL   Nitrite NEGATIVE NEGATIVE   Leukocytes,Ua NEGATIVE NEGATIVE   RBC / HPF 0-5 0 - 5 RBC/hpf   WBC, UA 0-5 0 - 5 WBC/hpf   Bacteria, UA RARE (A) NONE SEEN   Squamous Epithelial / LPF 0-5 0 - 5   Mucus PRESENT     MDM Patient presents with abdominal cramping and vaginal bleeding that has been off and on since Thursday.  On exam she has tiny abrasions on her labia minora.  Do not look like lesions or HSV.  Scant bright red blood coming from abrasions.  No blood  from her urethra.  No blood in the vagina and cervix appears normal.  Cervix is closed.  She is Rh+.  She has no contractions noted on the monitor.  Urine culture collected the other day showed multiple species suggest recollect; will send today's urine for culture.  Assessment and Plan   1. Abrasion of vulva, initial encounter   2. Abdominal cramping affecting pregnancy   3. [redacted] weeks gestation of pregnancy   -urine culture pending -reviewed reasons to return to MAU -discussing patting area clean & dry, avoid rubbing -keep appt with OB on Tuesday   Judeth Horn 08/15/2021, 12:19 AM

## 2021-08-16 LAB — CULTURE, OB URINE: Culture: 30000 — AB

## 2021-09-09 ENCOUNTER — Ambulatory Visit: Payer: 59 | Admitting: *Deleted

## 2021-09-09 ENCOUNTER — Other Ambulatory Visit: Payer: Self-pay | Admitting: *Deleted

## 2021-09-09 ENCOUNTER — Ambulatory Visit: Payer: 59 | Attending: Obstetrics

## 2021-09-09 ENCOUNTER — Encounter: Payer: Self-pay | Admitting: *Deleted

## 2021-09-09 ENCOUNTER — Other Ambulatory Visit: Payer: Self-pay

## 2021-09-09 VITALS — BP 112/78 | HR 98

## 2021-09-09 DIAGNOSIS — O09293 Supervision of pregnancy with other poor reproductive or obstetric history, third trimester: Secondary | ICD-10-CM

## 2021-09-09 DIAGNOSIS — O99213 Obesity complicating pregnancy, third trimester: Secondary | ICD-10-CM | POA: Diagnosis not present

## 2021-09-09 DIAGNOSIS — O3402 Maternal care for unspecified congenital malformation of uterus, second trimester: Secondary | ICD-10-CM

## 2021-09-09 DIAGNOSIS — Z6833 Body mass index (BMI) 33.0-33.9, adult: Secondary | ICD-10-CM

## 2021-09-09 DIAGNOSIS — E669 Obesity, unspecified: Secondary | ICD-10-CM

## 2021-09-09 DIAGNOSIS — O3403 Maternal care for unspecified congenital malformation of uterus, third trimester: Secondary | ICD-10-CM

## 2021-09-09 DIAGNOSIS — Q513 Bicornate uterus: Secondary | ICD-10-CM

## 2021-09-09 DIAGNOSIS — O34219 Maternal care for unspecified type scar from previous cesarean delivery: Secondary | ICD-10-CM | POA: Insufficient documentation

## 2021-09-09 DIAGNOSIS — Z3A28 28 weeks gestation of pregnancy: Secondary | ICD-10-CM

## 2021-09-09 DIAGNOSIS — O34593 Maternal care for other abnormalities of gravid uterus, third trimester: Secondary | ICD-10-CM | POA: Insufficient documentation

## 2021-09-09 DIAGNOSIS — O09213 Supervision of pregnancy with history of pre-term labor, third trimester: Secondary | ICD-10-CM | POA: Diagnosis not present

## 2021-10-05 ENCOUNTER — Encounter: Payer: Self-pay | Admitting: Family Medicine

## 2021-10-14 ENCOUNTER — Ambulatory Visit: Payer: 59 | Attending: Obstetrics

## 2021-10-14 ENCOUNTER — Other Ambulatory Visit: Payer: Self-pay

## 2021-10-14 ENCOUNTER — Ambulatory Visit: Payer: 59 | Admitting: *Deleted

## 2021-10-14 ENCOUNTER — Other Ambulatory Visit: Payer: Self-pay | Admitting: *Deleted

## 2021-10-14 ENCOUNTER — Encounter: Payer: Self-pay | Admitting: *Deleted

## 2021-10-14 ENCOUNTER — Ambulatory Visit (HOSPITAL_BASED_OUTPATIENT_CLINIC_OR_DEPARTMENT_OTHER): Payer: 59 | Admitting: Maternal & Fetal Medicine

## 2021-10-14 VITALS — BP 114/75 | HR 87

## 2021-10-14 DIAGNOSIS — O3403 Maternal care for unspecified congenital malformation of uterus, third trimester: Secondary | ICD-10-CM | POA: Diagnosis present

## 2021-10-14 DIAGNOSIS — Q513 Bicornate uterus: Secondary | ICD-10-CM

## 2021-10-14 DIAGNOSIS — O24415 Gestational diabetes mellitus in pregnancy, controlled by oral hypoglycemic drugs: Secondary | ICD-10-CM | POA: Insufficient documentation

## 2021-10-14 DIAGNOSIS — O99213 Obesity complicating pregnancy, third trimester: Secondary | ICD-10-CM

## 2021-10-14 DIAGNOSIS — O09213 Supervision of pregnancy with history of pre-term labor, third trimester: Secondary | ICD-10-CM | POA: Diagnosis not present

## 2021-10-14 DIAGNOSIS — E669 Obesity, unspecified: Secondary | ICD-10-CM

## 2021-10-14 DIAGNOSIS — O24419 Gestational diabetes mellitus in pregnancy, unspecified control: Secondary | ICD-10-CM

## 2021-10-14 DIAGNOSIS — Z3A33 33 weeks gestation of pregnancy: Secondary | ICD-10-CM

## 2021-10-14 NOTE — Progress Notes (Signed)
MFM brief note  Ms. Sandra Bauer is a 35 yo G6P2 who is here at 75 w 5d. She is seen today at the request of Marlow Baars, MD for follow up growth regarding her bicornuate uterus.  She was recently diagnosed with GDM and now started on glyburide.  She notes that she has not initiated weekly antenatal testing.  Normal interval growth with measurements consistent with dates, however, the [facility] is measuring in the 99th%. Good fetal movement and amniotic fluid volume   Ms. Sandra Bauer has a know bicornuate uterus with prior cesarean deliveries. She plans for a repeat C/S at 39 weeks.  She reports that her blood sugars have improved since initiating glyburide 2.5 mg.  She has not began weekly testing, I recommended that she begin weekly testing at your offices.  Follow up growth in 4 weeks.  I spent 20 minutes with > 50% in face to face consultation.  Trent Gabler J.Caleigh Rabelo

## 2021-10-21 ENCOUNTER — Telehealth: Payer: Self-pay

## 2021-10-21 NOTE — Telephone Encounter (Signed)
A user error has taken place: encounter opened in error, closed for administrative reasons.

## 2021-11-06 ENCOUNTER — Encounter (HOSPITAL_COMMUNITY): Payer: Self-pay | Admitting: Obstetrics and Gynecology

## 2021-11-06 ENCOUNTER — Other Ambulatory Visit: Payer: Self-pay

## 2021-11-06 ENCOUNTER — Inpatient Hospital Stay (HOSPITAL_COMMUNITY)
Admission: AD | Admit: 2021-11-06 | Discharge: 2021-11-06 | Disposition: A | Discharge status: Home / Self Care | Payer: 59 | Attending: Obstetrics and Gynecology | Admitting: Obstetrics and Gynecology

## 2021-11-06 DIAGNOSIS — O26893 Other specified pregnancy related conditions, third trimester: Secondary | ICD-10-CM | POA: Insufficient documentation

## 2021-11-06 DIAGNOSIS — Z3689 Encounter for other specified antenatal screening: Secondary | ICD-10-CM

## 2021-11-06 DIAGNOSIS — Z3A37 37 weeks gestation of pregnancy: Secondary | ICD-10-CM | POA: Diagnosis not present

## 2021-11-06 DIAGNOSIS — M549 Dorsalgia, unspecified: Secondary | ICD-10-CM | POA: Insufficient documentation

## 2021-11-06 DIAGNOSIS — O99891 Other specified diseases and conditions complicating pregnancy: Secondary | ICD-10-CM

## 2021-11-06 DIAGNOSIS — O368130 Decreased fetal movements, third trimester, not applicable or unspecified: Principal | ICD-10-CM

## 2021-11-06 DIAGNOSIS — Z0371 Encounter for suspected problem with amniotic cavity and membrane ruled out: Secondary | ICD-10-CM

## 2021-11-06 LAB — POCT FERN TEST: POCT Fern Test: NEGATIVE

## 2021-11-06 NOTE — MAU Provider Note (Signed)
History     [id number]  Arrival date and time: 11/06/21 1326    Chief Complaint  Patient presents with   Decreased Fetal Movement   Back Pain     HPI Sandra Bauer is a 35 y.o. at [redacted]w[redacted]d by LMP with PMHx notable for previous c/section, molar pregnancy, GDM in previous pregnancy & bicornate uterus who presents for back pain, vaginal discharge, & decreased fetal movement.  Reports constant low back pain & pelvic pressure that has been worse since this morning. Pain worse with walking. Rates pain 8/10. Hasn't treated symptoms. Denies abdominal pain or vaginal bleeding. Felt like she had a gush or trickle of watery discharge today. Also noticed a decrease in fetal movement since this morning.    Vaginal bleeding: No LOF: Yes Fetal Movement: Yes - decreased Contractions: No  Review of outside prenatal records from [facility] Office (in media tab): reviewed. Scheduled for repeat c/section on 12/5. GBS positive   OB History     Gravida  6   Para  2   Term  1   Preterm  1   AB  3   Living  2      SAB  2   IAB      Ectopic      Multiple  0   Live Births  2           Past Medical History:  Diagnosis Date   Bicornate uterus    Renal u/s 11/2015 to eval for renal abnormalities: NORMAL   GAD (generalized anxiety disorder)    GDM (gestational diabetes mellitus) 2022   diet   History of gestational diabetes 2017   PMDD (premenstrual dysphoric disorder)    Recurrent depression (HCC)    Seasonal allergic rhinitis    Wears contact lenses     Past Surgical History:  Procedure Laterality Date   CESAREAN SECTION N/A 09/01/2016   Procedure: CESAREAN SECTION;  Surgeon: Marlow Baars, MD;  Location: [facility];  Service: Obstetrics;  Laterality: N/A;   CESAREAN SECTION N/A 02/28/2018   Procedure: REPEAT CESAREAN SECTION;  Surgeon: [name], MD;  Location: [facility]THING SUIT[facility];  Service: Obstetrics;  Laterality: N/A;   DILATION AND EVACUATION N/A  10/27/2015   Procedure: DILATATION AND EVACUATION ULTRASOUND;  Surgeon: Marlow Baars, MD;  Location: [facility];  Service: Gynecology;  Laterality: N/A;  Ultrasound called on 10/26/15 by Donne Hazel RN   DILATION AND EVACUATION N/A 06/02/2019   Procedure: DILATATION AND EVACUATION;  Surgeon: Marlow Baars, MD;  Location: [facility];  Service: Gynecology;  Laterality: N/A;   DILATION AND EVACUATION N/A 04/30/2020   Procedure: DILATATION AND EVACUATION;  Surgeon: Marlow Baars, MD;  Location: [facility];  Service: Gynecology;  Laterality: N/A;  with ultrasound guidance   OPERATIVE ULTRASOUND N/A 04/30/2020   Procedure: OPERATIVE ULTRASOUND;  Surgeon: Marlow Baars, MD;  Location: [facility];  Service: Gynecology;  Laterality: N/A;   WISDOM TOOTH EXTRACTION      Family History  Problem Relation Age of Onset   Cancer Maternal Grandfather        "stomach"   Diabetes Paternal Grandfather    Stroke Paternal Grandfather     Social History   Socioeconomic History   Marital status: Married    Spouse name: Not on file   Number of children: Not on file   Years of education: Not on file   Highest education level: Not on file  Occupational  History   Not on file  Tobacco Use   Smoking status: Never   Smokeless tobacco: Never  Vaping Use   Vaping Use: Never used  Substance and Sexual Activity   Alcohol use: No   Drug use: Never   Sexual activity: Yes    Birth control/protection: None  Other Topics Concern   Not on file  Social History Narrative   Married 2015.   Orig from [place], lives in [place], [place].   Has three younger sisters in [place].   Attending nursing school, works full time at [employer] in [place].   No T/A/Ds.   Social Determinants of Health   Financial Resource Strain: Not on file  Food Insecurity: Not on file  Transportation Needs: Not on file  Physical Activity: Not on file  Stress: Not on file  Social Connections: Not on file   Intimate Partner Violence: Not on file    Allergies  Allergen Reactions   Nuvaring [Etonogestrel-Ethinyl Estradiol]     Vaginal burning, HA, bloating, mood change, nose bleed    No current facility-administered medications on file prior to encounter.   Current Outpatient Medications on File Prior to Encounter  Medication Sig Dispense Refill   Ascorbic Acid (VITAMIN C ADULT GUMMIES PO) Take by mouth.     Prenatal Vit-Fe Fumarate-FA (PRENATAL MULTIVITAMIN) TABS tablet Take 1 tablet by mouth daily at 12 noon.       ROS Pertinent positives and negative per HPI, all others reviewed and negative  Physical Exam   BP 115/75   Pulse 88   Temp 98.9 F (37.2 C) (Oral)   Resp 20   LMP 02/20/2021   SpO2 99%   Physical Exam Vitals and nursing note reviewed. Exam conducted with a chaperone present.  Constitutional:      General: She is not in acute distress.    Appearance: Normal appearance. She is not ill-appearing.  HENT:     Head: Normocephalic and atraumatic.  Eyes:     General: No scleral icterus. Pulmonary:     Effort: Pulmonary effort is normal. No respiratory distress.  Abdominal:     Palpations: Abdomen is soft.     Tenderness: There is no abdominal tenderness.  Genitourinary:    Comments: SSE: no pooling of fluid. Physiologic discharge. No blood.    Skin:    General: Skin is warm and dry.  Neurological:     General: No focal deficit present.     Mental Status: She is alert.  Psychiatric:        Mood and Affect: Mood normal.        Behavior: Behavior normal.    Cervical Exam Dilation: Closed Effacement (%): 50 Cervical Position: Posterior Station: -2 Presentation: Vertex Exam by:: Judeth Horn NP   FHT Baseline 130, moderate variability, 15x15 accels, no decels Toco: x2 Cat: 1  Labs Results for orders placed or performed during the hospital encounter of 11/06/21 (from the past 24 hour(s))  Fern Test     Status: None   Collection Time: 11/06/21   2:57 PM  Result Value Ref Range   POCT Fern Test Negative = intact amniotic membranes     Imaging No results found.  MAU Course  Procedures Lab Orders         Fern Test     No orders of the defined types were placed in this encounter.  Imaging Orders  No imaging studies ordered today    MDM Patient reports improvement in fetal movement since  arriving to MAU. Reactive NST. Patient documented 6 movement in 1 hour.   Sterile speculum exam performed; no pooling of fluid. Fern collected & evaluated by myself - fern slide negative.   Back pain & pelvic pain worse today. Exam & description consistent with pain associated with advance pregnancy. Continue maternity support belt & take tylenol prn.   Assessment and Plan   1. Encounter for suspected PROM, with rupture of membranes not found  -no pooling & negative fern  2. Back pain affecting pregnancy in third trimester  -tylenol prn -maternity support belt -labor precautions  3. Decreased fetal movements in third trimester, single or unspecified fetus  -reactive NST & movement felt in MAU  4. NST (non-stress test) reactive   5. [redacted] weeks gestation of pregnancy      Judeth Horn, NP

## 2021-11-06 NOTE — MAU Note (Signed)
Pt reports that she has been having lower back pain off and on throughout the weekend.   Pt reports she was at the grocery store and sneezed and felt a gush of fluid.   Pt reports she has to change her underwear and is still having clear fluid leak.   Pt reports decreased fetal movement since this morning.   Denies vaginal bleeding

## 2021-11-08 ENCOUNTER — Ambulatory Visit: Payer: 59

## 2021-11-08 ENCOUNTER — Ambulatory Visit: Payer: Self-pay

## 2021-11-18 ENCOUNTER — Encounter (HOSPITAL_COMMUNITY)
Admission: RE | Admit: 2021-11-18 | Discharge: 2021-11-18 | Disposition: A | Discharge status: Home / Self Care | Payer: 59 | Source: Ambulatory Visit | Attending: Obstetrics | Admitting: Obstetrics

## 2021-11-18 ENCOUNTER — Other Ambulatory Visit: Payer: Self-pay

## 2021-11-18 ENCOUNTER — Other Ambulatory Visit: Payer: Self-pay | Admitting: Obstetrics

## 2021-11-18 DIAGNOSIS — Z98891 History of uterine scar from previous surgery: Secondary | ICD-10-CM

## 2021-11-18 DIAGNOSIS — Z01812 Encounter for preprocedural laboratory examination: Secondary | ICD-10-CM | POA: Insufficient documentation

## 2021-11-18 LAB — CBC
HCT: 35.1 % — ABNORMAL LOW (ref 36.0–46.0)
Hemoglobin: 11.7 g/dL — ABNORMAL LOW (ref 12.0–15.0)
MCH: 28 pg (ref 26.0–34.0)
MCHC: 33.3 g/dL (ref 30.0–36.0)
MCV: 84 fL (ref 80.0–100.0)
Platelets: 243 10*3/uL (ref 150–400)
RBC: 4.18 MIL/uL (ref 3.87–5.11)
RDW: 14.4 % (ref 11.5–15.5)
WBC: 7.2 10*3/uL (ref 4.0–10.5)
nRBC: 0 % (ref 0.0–0.2)

## 2021-11-18 LAB — TYPE AND SCREEN
ABO/RH(D): O POS
Antibody Screen: NEGATIVE

## 2021-11-18 NOTE — Patient Instructions (Signed)
Sandra Bauer  11/18/2021   Your procedure is scheduled on:  11/21/2021  Arrive at 5:15AM at Entrance C on [address] at [facility]  and [address]. You are invited to use the FREE valet parking or use the Visitor's parking deck.  Pick up the phone at the desk and dial [phone].  Call this number if you have problems the morning of surgery: [phone]  Remember:   Do not eat food:(After Midnight) Desps de medianoche.  Do not drink clear liquids: (After Midnight) Desps de medianoche.  Take these medicines the morning of surgery with A SIP OF WATER:  none   Do not wear jewelry, make-up or nail polish.  Do not wear lotions, powders, or perfumes. Do not wear deodorant.  Do not shave 48 hours prior to surgery.  Do not bring valuables to the hospital.  [facility] is not   responsible for any belongings or valuables brought to the hospital.  Contacts, dentures or bridgework may not be worn into surgery.  Leave suitcase in the car. After surgery it may be brought to your room.  For patients admitted to the hospital, checkout time is 11:00 AM the day of              discharge.      Please read over the following fact sheets that you were given:     Preparing for Surgery

## 2021-11-19 LAB — RPR: RPR Ser Ql: NONREACTIVE

## 2021-11-19 LAB — SARS CORONAVIRUS 2 (TAT 6-24 HRS): SARS Coronavirus 2: NEGATIVE

## 2021-11-21 ENCOUNTER — Inpatient Hospital Stay (HOSPITAL_COMMUNITY): Payer: 59 | Admitting: Anesthesiology

## 2021-11-21 ENCOUNTER — Encounter (HOSPITAL_COMMUNITY)
Admission: RE | Disposition: A | Discharge status: Home / Self Care | Payer: Self-pay | Source: Home / Self Care | Attending: Obstetrics

## 2021-11-21 ENCOUNTER — Other Ambulatory Visit: Payer: Self-pay

## 2021-11-21 ENCOUNTER — Inpatient Hospital Stay (HOSPITAL_COMMUNITY)
Admission: RE | Admit: 2021-11-21 | Discharge: 2021-11-23 | DRG: 784 | Disposition: A | Discharge status: Home / Self Care | Payer: 59 | Attending: Obstetrics | Admitting: Obstetrics

## 2021-11-21 ENCOUNTER — Encounter (HOSPITAL_COMMUNITY): Payer: Self-pay | Admitting: Obstetrics

## 2021-11-21 DIAGNOSIS — O3403 Maternal care for unspecified congenital malformation of uterus, third trimester: Secondary | ICD-10-CM | POA: Diagnosis present

## 2021-11-21 DIAGNOSIS — O9081 Anemia of the puerperium: Secondary | ICD-10-CM | POA: Diagnosis not present

## 2021-11-21 DIAGNOSIS — O34211 Maternal care for low transverse scar from previous cesarean delivery: Principal | ICD-10-CM | POA: Diagnosis present

## 2021-11-21 DIAGNOSIS — Z302 Encounter for sterilization: Secondary | ICD-10-CM

## 2021-11-21 DIAGNOSIS — D62 Acute posthemorrhagic anemia: Secondary | ICD-10-CM | POA: Diagnosis not present

## 2021-11-21 DIAGNOSIS — Z3A39 39 weeks gestation of pregnancy: Secondary | ICD-10-CM | POA: Diagnosis not present

## 2021-11-21 DIAGNOSIS — O99824 Streptococcus B carrier state complicating childbirth: Secondary | ICD-10-CM | POA: Diagnosis present

## 2021-11-21 DIAGNOSIS — Z98891 History of uterine scar from previous surgery: Principal | ICD-10-CM

## 2021-11-21 DIAGNOSIS — M79605 Pain in left leg: Secondary | ICD-10-CM | POA: Diagnosis not present

## 2021-11-21 DIAGNOSIS — O24420 Gestational diabetes mellitus in childbirth, diet controlled: Secondary | ICD-10-CM | POA: Diagnosis present

## 2021-11-21 DIAGNOSIS — Q513 Bicornate uterus: Secondary | ICD-10-CM

## 2021-11-21 LAB — GLUCOSE, CAPILLARY
Glucose-Capillary: 83 mg/dL (ref 70–99)
Glucose-Capillary: 87 mg/dL (ref 70–99)

## 2021-11-21 SURGERY — Surgical Case
Anesthesia: Spinal

## 2021-11-21 MED ORDER — ACETAMINOPHEN 500 MG PO TABS: 1000.0000 mg | ORAL_TABLET | Freq: Four times a day (QID) | ORAL | Status: DC

## 2021-11-21 MED ORDER — PHENYLEPHRINE HCL-NACL 20-0.9 MG/250ML-% IV SOLN
INTRAVENOUS | Status: DC | PRN
  Administered 2021-11-21: 60 ug/min via INTRAVENOUS

## 2021-11-21 MED ORDER — WITCH HAZEL-GLYCERIN EX PADS: 1.0000 "application " | MEDICATED_PAD | CUTANEOUS | Status: DC | PRN

## 2021-11-21 MED ORDER — FENTANYL CITRATE (PF) 100 MCG/2ML IJ SOLN
25.0000 ug | INTRAMUSCULAR | Status: DC | PRN
  Administered 2021-11-21: 50 ug via INTRAVENOUS

## 2021-11-21 MED ORDER — PHENYLEPHRINE HCL-NACL 20-0.9 MG/250ML-% IV SOLN
INTRAVENOUS | Status: AC
  Filled 2021-11-21: qty 250

## 2021-11-21 MED ORDER — DIBUCAINE (PERIANAL) 1 % EX OINT: 1.0000 "application " | TOPICAL_OINTMENT | CUTANEOUS | Status: DC | PRN

## 2021-11-21 MED ORDER — ACETAMINOPHEN 10 MG/ML IV SOLN
INTRAVENOUS | Status: DC | PRN
  Administered 2021-11-21: 1000 mg via INTRAVENOUS

## 2021-11-21 MED ORDER — METOCLOPRAMIDE HCL 5 MG/ML IJ SOLN
INTRAMUSCULAR | Status: AC
  Filled 2021-11-21: qty 2

## 2021-11-21 MED ORDER — OXYTOCIN-SODIUM CHLORIDE 30-0.9 UT/500ML-% IV SOLN
INTRAVENOUS | Status: DC | PRN
  Administered 2021-11-21: 400 mL via INTRAVENOUS

## 2021-11-21 MED ORDER — COCONUT OIL OIL: 1.0000 "application " | TOPICAL_OIL | Status: DC | PRN

## 2021-11-21 MED ORDER — FENTANYL CITRATE (PF) 100 MCG/2ML IJ SOLN
INTRAMUSCULAR | Status: DC | PRN
  Administered 2021-11-21: 15 ug via INTRATHECAL

## 2021-11-21 MED ORDER — NALBUPHINE HCL 10 MG/ML IJ SOLN: 5.0000 mg | Freq: Once | INTRAMUSCULAR | Status: DC | PRN

## 2021-11-21 MED ORDER — MENTHOL 3 MG MT LOZG: 1.0000 | LOZENGE | OROMUCOSAL | Status: DC | PRN

## 2021-11-21 MED ORDER — KETOROLAC TROMETHAMINE 30 MG/ML IJ SOLN
30.0000 mg | Freq: Four times a day (QID) | INTRAMUSCULAR | Status: AC
  Administered 2021-11-21 – 2021-11-22 (×3): 30 mg via INTRAVENOUS
  Filled 2021-11-21 (×3): qty 1

## 2021-11-21 MED ORDER — KETOROLAC TROMETHAMINE 30 MG/ML IJ SOLN: 30.0000 mg | Freq: Four times a day (QID) | INTRAMUSCULAR | Status: AC | PRN

## 2021-11-21 MED ORDER — LACTATED RINGERS IV SOLN: INTRAVENOUS | Status: DC | PRN

## 2021-11-21 MED ORDER — SCOPOLAMINE 1 MG/3DAYS TD PT72: 1.0000 | MEDICATED_PATCH | Freq: Once | TRANSDERMAL | Status: DC

## 2021-11-21 MED ORDER — OXYCODONE HCL 5 MG PO TABS
5.0000 mg | ORAL_TABLET | ORAL | Status: DC | PRN
  Administered 2021-11-22 – 2021-11-23 (×4): 5 mg via ORAL
  Filled 2021-11-21 (×4): qty 1

## 2021-11-21 MED ORDER — IBUPROFEN 600 MG PO TABS
600.0000 mg | ORAL_TABLET | Freq: Four times a day (QID) | ORAL | Status: DC
  Administered 2021-11-22 – 2021-11-23 (×5): 600 mg via ORAL
  Filled 2021-11-21 (×5): qty 1

## 2021-11-21 MED ORDER — DEXAMETHASONE SODIUM PHOSPHATE 4 MG/ML IJ SOLN
INTRAMUSCULAR | Status: AC
  Filled 2021-11-21: qty 2

## 2021-11-21 MED ORDER — DEXAMETHASONE SODIUM PHOSPHATE 4 MG/ML IJ SOLN
INTRAMUSCULAR | Status: DC | PRN
  Administered 2021-11-21: 8 mg via INTRAVENOUS

## 2021-11-21 MED ORDER — CEFAZOLIN SODIUM-DEXTROSE 2-3 GM-%(50ML) IV SOLR
INTRAVENOUS | Status: DC | PRN
  Administered 2021-11-21: 2 g via INTRAVENOUS

## 2021-11-21 MED ORDER — CEFAZOLIN SODIUM-DEXTROSE 2-4 GM/100ML-% IV SOLN: 2.0000 g | INTRAVENOUS | Status: DC

## 2021-11-21 MED ORDER — ONDANSETRON HCL 4 MG/2ML IJ SOLN
INTRAMUSCULAR | Status: AC
  Filled 2021-11-21: qty 2

## 2021-11-21 MED ORDER — POVIDONE-IODINE 10 % EX SWAB: 2.0000 "application " | Freq: Once | CUTANEOUS | Status: DC

## 2021-11-21 MED ORDER — NALBUPHINE HCL 10 MG/ML IJ SOLN: 5.0000 mg | INTRAMUSCULAR | Status: DC | PRN

## 2021-11-21 MED ORDER — LACTATED RINGERS IV SOLN: INTRAVENOUS | Status: DC

## 2021-11-21 MED ORDER — OXYTOCIN-SODIUM CHLORIDE 30-0.9 UT/500ML-% IV SOLN
2.5000 [IU]/h | INTRAVENOUS | Status: AC
  Administered 2021-11-21: 2.5 [IU]/h via INTRAVENOUS
  Filled 2021-11-21: qty 500

## 2021-11-21 MED ORDER — ONDANSETRON HCL 4 MG/2ML IJ SOLN: 4.0000 mg | Freq: Three times a day (TID) | INTRAMUSCULAR | Status: DC | PRN

## 2021-11-21 MED ORDER — DIPHENHYDRAMINE HCL 25 MG PO CAPS: 25.0000 mg | ORAL_CAPSULE | ORAL | Status: DC | PRN

## 2021-11-21 MED ORDER — ONDANSETRON HCL 4 MG/2ML IJ SOLN
INTRAMUSCULAR | Status: DC | PRN
  Administered 2021-11-21: 4 mg via INTRAVENOUS

## 2021-11-21 MED ORDER — KETOROLAC TROMETHAMINE 30 MG/ML IJ SOLN
30.0000 mg | Freq: Once | INTRAMUSCULAR | Status: AC | PRN
  Administered 2021-11-21: 30 mg via INTRAVENOUS

## 2021-11-21 MED ORDER — FENTANYL CITRATE (PF) 100 MCG/2ML IJ SOLN
INTRAMUSCULAR | Status: AC
  Filled 2021-11-21: qty 2

## 2021-11-21 MED ORDER — NALOXONE HCL 0.4 MG/ML IJ SOLN: 0.4000 mg | INTRAMUSCULAR | Status: DC | PRN

## 2021-11-21 MED ORDER — SODIUM CHLORIDE 0.9% FLUSH: 3.0000 mL | INTRAVENOUS | Status: DC | PRN

## 2021-11-21 MED ORDER — KETOROLAC TROMETHAMINE 30 MG/ML IJ SOLN
INTRAMUSCULAR | Status: AC
  Filled 2021-11-21: qty 1

## 2021-11-21 MED ORDER — DIPHENHYDRAMINE HCL 25 MG PO CAPS: 25.0000 mg | ORAL_CAPSULE | Freq: Four times a day (QID) | ORAL | Status: DC | PRN

## 2021-11-21 MED ORDER — PHENYLEPHRINE HCL (PRESSORS) 10 MG/ML IV SOLN
INTRAVENOUS | Status: DC | PRN
  Administered 2021-11-21 (×2): 80 ug via INTRAVENOUS

## 2021-11-21 MED ORDER — SIMETHICONE 80 MG PO CHEW: 80.0000 mg | CHEWABLE_TABLET | ORAL | Status: DC | PRN

## 2021-11-21 MED ORDER — NALOXONE HCL 4 MG/10ML IJ SOLN
1.0000 ug/kg/h | INTRAVENOUS | Status: DC | PRN
  Filled 2021-11-21: qty 5

## 2021-11-21 MED ORDER — ACETAMINOPHEN 500 MG PO TABS
1000.0000 mg | ORAL_TABLET | Freq: Four times a day (QID) | ORAL | Status: DC
  Administered 2021-11-21 – 2021-11-23 (×8): 1000 mg via ORAL
  Filled 2021-11-21 (×8): qty 2

## 2021-11-21 MED ORDER — SENNOSIDES-DOCUSATE SODIUM 8.6-50 MG PO TABS
2.0000 | ORAL_TABLET | ORAL | Status: DC
  Administered 2021-11-21 – 2021-11-23 (×2): 2 via ORAL
  Filled 2021-11-21 (×2): qty 2

## 2021-11-21 MED ORDER — MORPHINE SULFATE (PF) 0.5 MG/ML IJ SOLN
INTRAMUSCULAR | Status: AC
  Filled 2021-11-21: qty 10

## 2021-11-21 MED ORDER — CEFAZOLIN SODIUM-DEXTROSE 2-4 GM/100ML-% IV SOLN
INTRAVENOUS | Status: AC
  Filled 2021-11-21: qty 100

## 2021-11-21 MED ORDER — MORPHINE SULFATE (PF) 0.5 MG/ML IJ SOLN
INTRAMUSCULAR | Status: DC | PRN
  Administered 2021-11-21: .15 mg via INTRATHECAL

## 2021-11-21 MED ORDER — PHENYLEPHRINE 40 MCG/ML (10ML) SYRINGE FOR IV PUSH (FOR BLOOD PRESSURE SUPPORT)
PREFILLED_SYRINGE | INTRAVENOUS | Status: AC
  Filled 2021-11-21: qty 10

## 2021-11-21 MED ORDER — BUPIVACAINE IN DEXTROSE 0.75-8.25 % IT SOLN
INTRATHECAL | Status: DC | PRN
  Administered 2021-11-21: 1.8 mL via INTRATHECAL

## 2021-11-21 MED ORDER — PRENATAL MULTIVITAMIN CH
1.0000 | ORAL_TABLET | Freq: Every day | ORAL | Status: DC
  Administered 2021-11-22 – 2021-11-23 (×2): 1 via ORAL
  Filled 2021-11-21 (×2): qty 1

## 2021-11-21 MED ORDER — SIMETHICONE 80 MG PO CHEW
80.0000 mg | CHEWABLE_TABLET | Freq: Three times a day (TID) | ORAL | Status: DC
  Administered 2021-11-21 – 2021-11-23 (×5): 80 mg via ORAL
  Filled 2021-11-21 (×5): qty 1

## 2021-11-21 MED ORDER — OXYTOCIN-SODIUM CHLORIDE 30-0.9 UT/500ML-% IV SOLN
INTRAVENOUS | Status: AC
  Filled 2021-11-21: qty 500

## 2021-11-21 MED ORDER — DIPHENHYDRAMINE HCL 50 MG/ML IJ SOLN: 12.5000 mg | INTRAMUSCULAR | Status: DC | PRN

## 2021-11-21 MED ORDER — ACETAMINOPHEN 10 MG/ML IV SOLN: 1000.0000 mg | Freq: Once | INTRAVENOUS | Status: DC | PRN

## 2021-11-21 SURGICAL SUPPLY — 40 items
BENZOIN TINCTURE PRP APPL 2/3 (GAUZE/BANDAGES/DRESSINGS) ×2 IMPLANT
CLAMP CORD UMBIL (MISCELLANEOUS) IMPLANT
CLOSURE STERI STRIP 1/2 X4 (GAUZE/BANDAGES/DRESSINGS) ×2 IMPLANT
CLOTH BEACON ORANGE TIMEOUT ST (SAFETY) ×2 IMPLANT
DRSG OPSITE POSTOP 4X10 (GAUZE/BANDAGES/DRESSINGS) ×2 IMPLANT
ELECT REM PT RETURN 9FT ADLT (ELECTROSURGICAL) ×2
ELECTRODE REM PT RTRN 9FT ADLT (ELECTROSURGICAL) ×1 IMPLANT
EXTRACTOR VACUUM KIWI (MISCELLANEOUS) IMPLANT
GLOVE BIOGEL PI IND STRL 6.5 (GLOVE) ×1 IMPLANT
GLOVE BIOGEL PI IND STRL 7.0 (GLOVE) ×1 IMPLANT
GLOVE BIOGEL PI INDICATOR 6.5 (GLOVE) ×1
GLOVE BIOGEL PI INDICATOR 7.0 (GLOVE) ×1
GLOVE ECLIPSE 6.0 STRL STRAW (GLOVE) ×2 IMPLANT
GOWN STRL REUS W/TWL LRG LVL3 (GOWN DISPOSABLE) ×4 IMPLANT
HEMOSTAT ARISTA ABSORB 3G PWDR (HEMOSTASIS) ×2 IMPLANT
KIT ABG SYR 3ML LUER SLIP (SYRINGE) IMPLANT
NEEDLE HYPO 25X5/8 SAFETYGLIDE (NEEDLE) IMPLANT
NS IRRIG 1000ML POUR BTL (IV SOLUTION) ×2 IMPLANT
PACK C SECTION WH (CUSTOM PROCEDURE TRAY) ×2 IMPLANT
PAD OB MATERNITY 4.3X12.25 (PERSONAL CARE ITEMS) ×2 IMPLANT
PENCIL SMOKE EVAC W/HOLSTER (ELECTROSURGICAL) ×2 IMPLANT
RTRCTR C-SECT PINK 25CM LRG (MISCELLANEOUS) ×2 IMPLANT
STRIP CLOSURE SKIN 1/2X4 (GAUZE/BANDAGES/DRESSINGS) ×2 IMPLANT
SUT MNCRL 0 VIOLET CTX 36 (SUTURE) ×2 IMPLANT
SUT MNCRL AB 3-0 PS2 27 (SUTURE) ×2 IMPLANT
SUT MONOCRYL 0 CTX 36 (SUTURE) ×2
SUT PLAIN 0 NONE (SUTURE) IMPLANT
SUT PLAIN 2 0 (SUTURE) ×1
SUT PLAIN ABS 2-0 CT1 27XMFL (SUTURE) ×1 IMPLANT
SUT VIC AB 0 CTX 36 (SUTURE) ×2
SUT VIC AB 0 CTX36XBRD ANBCTRL (SUTURE) ×2 IMPLANT
SUT VIC AB 2-0 CT1 27 (SUTURE) ×1
SUT VIC AB 2-0 CT1 TAPERPNT 27 (SUTURE) ×1 IMPLANT
SUT VIC AB 2-0 SH 27 (SUTURE) ×1
SUT VIC AB 2-0 SH 27XBRD (SUTURE) ×1 IMPLANT
SUT VICRYL 2 0 18  TIES (SUTURE) ×1
SUT VICRYL 2 0 18 TIES (SUTURE) ×1 IMPLANT
TOWEL OR 17X24 6PK STRL BLUE (TOWEL DISPOSABLE) ×2 IMPLANT
TRAY FOLEY W/BAG SLVR 14FR LF (SET/KITS/TRAYS/PACK) ×2 IMPLANT
WATER STERILE IRR 1000ML POUR (IV SOLUTION) ×2 IMPLANT

## 2021-11-21 NOTE — Anesthesia Procedure Notes (Signed)
Spinal  Patient location during procedure: OR Start time: 11/21/2021 8:18 AM End time: 11/21/2021 8:21 AM Reason for block: surgical anesthesia Staffing Performed: anesthesiologist  Anesthesiologist: Elmer Picker, MD Preanesthetic Checklist Completed: patient identified, IV checked, risks and benefits discussed, surgical consent, monitors and equipment checked, pre-op evaluation and timeout performed Spinal Block Patient position: sitting Prep: DuraPrep and site prepped and draped Patient monitoring: cardiac monitor, continuous pulse ox and blood pressure Approach: midline Location: L3-4 Injection technique: single-shot Needle Needle type: Pencan  Needle gauge: 24 G Needle length: 9 cm Assessment Sensory level: T6 Events: CSF return Additional Notes Functioning IV was confirmed and monitors were applied. Sterile prep and drape, including hand hygiene and sterile gloves were used. The patient was positioned and the spine was prepped. The skin was anesthetized with lidocaine.  Free flow of clear CSF was obtained prior to injecting local anesthetic into the CSF.  The spinal needle aspirated freely following injection.  The needle was carefully withdrawn.  The patient tolerated the procedure well.

## 2021-11-21 NOTE — Anesthesia Preprocedure Evaluation (Addendum)
Anesthesia Evaluation  Patient identified by MRN, date of birth, ID band Patient awake    Reviewed: Allergy & Precautions, NPO status , Patient's Chart, lab work & pertinent test results  Airway Mallampati: III  TM Distance: >3 FB Neck ROM: Full    Dental no notable dental hx.    Pulmonary neg pulmonary ROS,    Pulmonary exam normal breath sounds clear to auscultation       Cardiovascular negative cardio ROS Normal cardiovascular exam Rhythm:Regular Rate:Normal     Neuro/Psych PSYCHIATRIC DISORDERS Anxiety Depression negative neurological ROS     GI/Hepatic negative GI ROS, Neg liver ROS,   Endo/Other  diabetes, Well Controlled, Gestational  Renal/GU negative Renal ROS  negative genitourinary   Musculoskeletal negative musculoskeletal ROS (+)   Abdominal (+) + obese,   Peds  Hematology negative hematology ROS (+)   Anesthesia Other Findings Repeat C/Sx2  Reproductive/Obstetrics (+) Pregnancy                            Anesthesia Physical Anesthesia Plan  ASA: 3  Anesthesia Plan: Spinal   Post-op Pain Management:    Induction:   PONV Risk Score and Plan: 2 and Treatment may vary due to age or medical condition  Airway Management Planned: Natural Airway  Additional Equipment:   Intra-op Plan:   Post-operative Plan:   Informed Consent: I have reviewed the patients History and Physical, chart, labs and discussed the procedure including the risks, benefits and alternatives for the proposed anesthesia with the patient or authorized representative who has indicated his/her understanding and acceptance.     Dental advisory given  Plan Discussed with: CRNA  Anesthesia Plan Comments:         Anesthesia Quick Evaluation

## 2021-11-21 NOTE — H&P (Signed)
35 y.o. [id number] @ [redacted]w[redacted]d presents for repeat cesarean section and bilateral salpingectomy.  Otherwise has good fetal movement and no bleeding.  Pregnancy complicated by: Gestational diabetes mellitus, Class A1.  Was on glyburide briefly around 32 weeks but did not tolerate well.  Made additional dietary changes and did not require additional medication.  Most recent growth [place] at 37 weeks 6lb 14oz (51.5%) Bicornuate uterus:  pregnancy in left horn.   History of cesarean section x 2  Past Medical History:  Diagnosis Date   Bicornate uterus    Renal u/s 11/2015 to eval for renal abnormalities: NORMAL   GAD (generalized anxiety disorder)    GDM (gestational diabetes mellitus) 2022   diet   History of gestational diabetes 2017   PMDD (premenstrual dysphoric disorder)    Recurrent depression (HCC)    Seasonal allergic rhinitis    Wears contact lenses     Past Surgical History:  Procedure Laterality Date   CESAREAN SECTION N/A 09/01/2016   Procedure: CESAREAN SECTION;  Surgeon: Marlow Baars, MD;  Location: [facility];  Service: Obstetrics;  Laterality: N/A;   CESAREAN SECTION N/A 02/28/2018   Procedure: REPEAT CESAREAN SECTION;  Surgeon: [name], MD;  Location: [facility] BIRTHING SUITES;  Service: Obstetrics;  Laterality: N/A;   DILATION AND EVACUATION N/A 10/27/2015   Procedure: DILATATION AND EVACUATION ULTRASOUND;  Surgeon: Marlow Baars, MD;  Location: [facility];  Service: Gynecology;  Laterality: N/A;  Ultrasound called on 10/26/15 by Donne Hazel RN   DILATION AND EVACUATION N/A 06/02/2019   Procedure: DILATATION AND EVACUATION;  Surgeon: Marlow Baars, MD;  Location: [facility];  Service: Gynecology;  Laterality: N/A;   DILATION AND EVACUATION N/A 04/30/2020   Procedure: DILATATION AND EVACUATION;  Surgeon: Marlow Baars, MD;  Location: [facility] [place];  Service: Gynecology;  Laterality: N/A;  with ultrasound guidance   OPERATIVE ULTRASOUND N/A  04/30/2020   Procedure: OPERATIVE ULTRASOUND;  Surgeon: Marlow Baars, MD;  Location: [facility] [place];  Service: Gynecology;  Laterality: N/A;   WISDOM TOOTH EXTRACTION      OB History  Gravida Para Term Preterm AB Living  6 2 1 1 3 2   SAB IAB Ectopic Multiple Live Births  2     0 2    # Outcome Date GA Lbr Len/2nd Weight Sex Delivery Anes PTL Lv  6 Current           5 Preterm 02/28/18 [redacted]w[redacted]d  3200 g M CS-LTranv Spinal  LIV  4 Term 09/01/16 [redacted]w[redacted]d  3155 g F CS-LTranv Spinal  LIV  3 Molar           2 SAB           1 SAB             Social History   Socioeconomic History   Marital status: Married    Spouse name: Not on file   Number of children: Not on file   Years of education: Not on file   Highest education level: Not on file  Occupational History   Not on file  Tobacco Use   Smoking status: Never   Smokeless tobacco: Never  Vaping Use   Vaping Use: Never used  Substance and Sexual Activity   Alcohol use: No   Drug use: Never   Sexual activity: Yes    Birth control/protection: None  Other Topics Concern   Not on file  Social History Narrative   Married 2015.  Orig from [place], lives in [place], [place].   Has three younger sisters in [place].   Attending nursing school, works full time at [employer] in [place].   No T/A/Ds.   Social Determinants of Health   Financial Resource Strain: Not on file  Food Insecurity: Not on file  Transportation Needs: Not on file  Physical Activity: Not on file  Stress: Not on file  Social Connections: Not on file  Intimate Partner Violence: Not on file   Nuvaring [etonogestrel-ethinyl estradiol] and Latex    Prenatal Transfer Tool  Maternal Diabetes: Yes:  Diabetes Type:  Diet controlled Genetic Screening: Normal Maternal Ultrasounds/Referrals: Normal Fetal Ultrasounds or other Referrals:  None Maternal Substance Abuse:  No Significant Maternal Medications:  None Significant Maternal Lab Results: Group B Strep  positive  ABO, Rh: --/--/O POS (12/02 1358) Antibody: NEG (12/02 1358) Rubella:  Immune RPR: NON REACTIVE (12/02 1358)  HBsAg:   Negative HIV:   Negative GBS:   Positive   Vitals:   11/21/21 0543  BP: 120/81  Pulse: 87  Resp: 18  Temp: 97.7 F (36.5 C)  SpO2: 100%     General:  NAD Abdomen:  soft, gravid Ex:  1+ edema FHTs:  140    A/P   35 y.o. [id number] [redacted]w[redacted]d presents for repeat cesarean section and bilateral salpingectomy.    Discussed risks of cesarean section to include, but not limited to, infection, bleeding, damage to surrounding strutcures (including bowel, bladder, tubes, ovaries, nerves, vessels, baby), need for additional procedures, risk of blood clot, need for transfusion.  We also discussed the permanent nature of sterilization, risk of failure (including ectopic pregnancy) and risk of regret. Consent signed Ancef 2gm on call to OR   [facility] GEFFEL Chestine Spore

## 2021-11-21 NOTE — Anesthesia Postprocedure Evaluation (Signed)
Anesthesia Post Note  Patient: Sandra Bauer  Procedure(s) Performed: CESAREAN SECTION WITH BILATERAL TUBAL LIGATION     Patient location during evaluation: PACU Anesthesia Type: Spinal Level of consciousness: oriented and awake and alert Pain management: pain level controlled Vital Signs Assessment: post-procedure vital signs reviewed and stable Respiratory status: spontaneous breathing, respiratory function stable and patient connected to nasal cannula oxygen Cardiovascular status: blood pressure returned to baseline and stable Postop Assessment: no headache, no backache and no apparent nausea or vomiting Anesthetic complications: no   No notable events documented.  Last Vitals:  Vitals:   11/21/21 1045 11/21/21 1105  BP: (!) 101/53 (!) 103/53  Pulse: 70 88  Resp: 17 16  Temp:  36.6 C  SpO2:  100%    Last Pain:  Vitals:   11/21/21 1105  TempSrc: Oral  PainSc: 4    Pain Goal:                   Bonny Egger L Jarelly Rinck

## 2021-11-21 NOTE — Transfer of Care (Signed)
Immediate Anesthesia Transfer of Care Note  Patient: Sandra Bauer  Procedure(s) Performed: CESAREAN SECTION WITH BILATERAL TUBAL LIGATION  Patient Location: PACU  Anesthesia Type:Spinal  Level of Consciousness: awake, alert  and oriented  Airway & Oxygen Therapy: Patient Spontanous Breathing  Post-op Assessment: Report given to RN and Post -op Vital signs reviewed and stable  Post vital signs: Reviewed and stable  Last Vitals:  Vitals Value Taken Time  BP 92/66 11/21/21 1015  Temp 37 C 11/21/21 0947  Pulse 67 11/21/21 1026  Resp 17 11/21/21 1026  SpO2 100 % 11/21/21 1026  Vitals shown include unvalidated device data.  Last Pain:  Vitals:   11/21/21 1015  TempSrc:   PainSc: 0-No pain         Complications: No notable events documented.

## 2021-11-21 NOTE — Op Note (Signed)
Cesarean Section Procedure Note  Pre-operative Diagnosis: 1. Intrauterine pregnancy at [redacted]w[redacted]d  2. History of cesarean section x 2  3. Desires permanent sterilization  Post-operative Diagnosis: same as above  Surgeon: Marlow Baars, MD  Assistants: Derl Barrow, MD  Procedure: Repeat low transverse cesarean section and bilateral salpingectomy  Anesthesia: Spinal anesthesia  Estimated Blood Loss: 498 mL         Drains: Foley catheter         Specimens: bilateral tubes to pathology              Complications:  None; patient tolerated the procedure well.         Disposition: PACU - hemodynamically stable.  Findings:  Normal uterus, tubes and ovaries bilaterally.  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 Viable female infant, 3780g (8lb 5.3 oz) Apgars 9, 9.    Procedure Details   After spinal  anesthesia was found to adequate, the patient was placed in the dorsal supine position with a leftward tilt, prepped and draped in the usual sterile manner. A Pfannenstiel incision was made and carried down through the subcutaneous tissue to the fascia.  The fascia was incised in the midline and the fascial incision was extended laterally with Mayo scissors. The superior aspect of the fascial incision was grasped with two Kocher clamps, tented up and the rectus muscles dissected off sharply. The rectus was then dissected off with blunt dissection and Mayo scissors inferiorly. The rectus muscles were separated in the midline. The abdominal peritoneum was identified, tented up, entered bluntly, and the incision was extended superiorly and inferiorly with good visualization of the bladder. The Alexis retractor was deployed. The vesicouterine peritoneum was identified, tented up, entered sharply, and the bladder flap was created digitally. A scalpel was then used to make a low transverse incision on the uterus which was extended in the cephalad-caudad direction with blunt dissection. The  fluid was clear. The fetal vertex was identified, elevated out of the pelvis and brought to the hysterotomy.  The head was delivered easily followed by the shoulders and body.  After a 60 second delay per protocol, the cord was clamped and cut and the infant was passed to the waiting neonatologist.  The placenta was then delivered spontaneously, intact and appear normal, the uterus was cleared of all clot and debris   The hysterotomy was repaired with #0 Monocryl in running locked fashion.  Excellent hemostasis was noted.  The left tube was identified and grasped with babcock clamps.  Starting at the fimbriated end, a clear window in the mesosalpinx was opened and grasped with a kelly clamp.  The tube was sequentially separated from the mesosalpinx and 2-0 vicryl free ties secured the pedicle until it could be transected at doubly tied at the level of the cornua.  The same was repeated with the right tube.   Both tubes were sent to pathology.  There was a small amount of bleeding at the left cornua, so two figure of eight stitches with 2-0 vicryl were placed with improvement.  The hysterotomy was re-examined.  Mild oozing of the serosal edges on the left side were noted.  Bovie cautery improved bleeding, but a slow persistent ooze continued so Arista was placed with excellent hemostasis. The Alexis retractor was removed from the abdomen. The peritoneum was examined and all vessels noted to be hemostatic. The abdominal cavity was cleared of all clot and debris.  The peritoneum was closed with 2-0 vicryl in a running fashion.  The rectus muscles were then closed with 2-0 Vicryl. The fascia and rectus muscles were inspected and were hemostatic. The fascia was closed with 0 Vicryl in a running fashion. The subcutaneous layer was irrigated and all bleeders cauterized. The subcutaneous layer was closed with interrupted plain gut. The skin was closed with 3-0 monocryl in a subcuticular fashion. The incision was dressed  with benzoine, steri strips and honeycomb dressing. All sponge lap and needle counts were correct x3. Patient tolerated the procedure well and recovered in stable condition following the procedure.

## 2021-11-21 NOTE — Lactation Note (Signed)
This note was copied from a baby's chart. Lactation Consultation Note Mom had baby on the breast BF when LC entered rm. Mom stated she needed help with other positioning. LC discussed positioned.  Assisted in football hold using boppy for support. Encouraged occasional breast massage. Newborn feeding habits, behavior, STS, I&O discussed. Mom had baby swaddled and then had a sleeper on. Encouraged to feed STS. Mom stated she only BF 1-3 months for her other 2 children then used formula.  Answered questions. Lactation brochure given.  Patient Name: [name] [id number] Date: 11/21/2021 Reason for consult: Initial assessment;Term;Maternal endocrine disorder Age:78 hours  Maternal Data Has patient been taught Hand Expression?: Yes Does the patient have breastfeeding experience prior to this delivery?: Yes How long did the patient breastfeed?: 1 1/2 months 1st child 2 1/2 months 2nd child  Feeding    LATCH Score Latch: Grasps breast easily, tongue down, lips flanged, rhythmical sucking.  Audible Swallowing: A few with stimulation  Type of Nipple: Everted at rest and after stimulation  Comfort (Breast/Nipple): Soft / non-tender  Hold (Positioning): No assistance needed to correctly position infant at breast.  LATCH Score: 9   Lactation Tools Discussed/Used    Interventions Interventions: Breast feeding basics reviewed;Support pillows;Skin to skin;Position options;Breast massage;Hand express;Breast compression  Discharge WIC Program: No  Consult Status Consult Status: Follow-up Date: 11/22/21 Follow-up type: In-patient    Charyl Dancer 11/21/2021, 10:35 PM

## 2021-11-22 LAB — CBC
HCT: 29.8 % — ABNORMAL LOW (ref 36.0–46.0)
Hemoglobin: 9.7 g/dL — ABNORMAL LOW (ref 12.0–15.0)
MCH: 27.7 pg (ref 26.0–34.0)
MCHC: 32.6 g/dL (ref 30.0–36.0)
MCV: 85.1 fL (ref 80.0–100.0)
Platelets: 203 10*3/uL (ref 150–400)
RBC: 3.5 MIL/uL — ABNORMAL LOW (ref 3.87–5.11)
RDW: 14.5 % (ref 11.5–15.5)
WBC: 9.8 10*3/uL (ref 4.0–10.5)
nRBC: 0 % (ref 0.0–0.2)

## 2021-11-22 LAB — GLUCOSE, CAPILLARY: Glucose-Capillary: 89 mg/dL (ref 70–99)

## 2021-11-22 NOTE — Progress Notes (Signed)
MOB was referred for history of depression/anxiety. * Referral screened out by Clinical Social Worker because none of the following criteria appear to apply: ~ History of anxiety/depression during this pregnancy, or of post-partum depression following prior delivery. No concerns of anxiety nor depression noted in prenatal records.  ~ Diagnosis of anxiety and/or depression within last 3 years. Per chart review, MOB's anxiety dates back to 2013 and depression dates back to 2016.  OR * MOB's symptoms currently being treated with medication and/or therapy. Please contact the Clinical Social Worker if needs arise, by [facility] request, or if MOB scores greater than 9/yes to question 10 on Edinburgh Postpartum Depression Screen.  Celso Sickle, LCSW Clinical Social Worker [facility] Cell#: [phone]

## 2021-11-22 NOTE — Progress Notes (Signed)
Patient is eating, ambulating, voiding.  Pain control is good.  Appropriate lochia, no complaints.  Vitals:   11/21/21 1843 11/21/21 2051 11/21/21 2252 11/22/21 0533  BP:    (!) 96/50  Pulse:    73  Resp:  18 18 18   Temp:   98 F (36.7 C) 98.2 F (36.8 C)  TempSrc:   Oral Oral  SpO2: 100% 100% 100%   Weight:      Height:        Fundus firm Abd: nontender Ext: no calf tenderness  Lab Results  Component Value Date   WBC 9.8 11/22/2021   HGB 9.7 (L) 11/22/2021   HCT 29.8 (L) 11/22/2021   MCV 85.1 11/22/2021   PLT 203 11/22/2021    --/--/O POS (12/02 1358)  A/P Post op day #1 s/p R C/S with bilateral salpingectomy. GDMA1 Anemia- acute blood loss, continue PNV with Fe  Routine care.  Expect d/c 12/7.    14/7

## 2021-11-22 NOTE — Lactation Note (Addendum)
This note was copied from a baby's chart. Lactation Consultation Note  Patient Name: Sandra Bauer Date: 11/22/2021 Reason for consult: Follow-up assessment;Term;Infant weight loss;Breastfeeding assistance Age:35 hours Mom requested LC visit .  As LC entered dad holding baby and mom sitting in the bed .  Per mom baby cluster fed last night and wondering if she was getting enough.  LC reviewed doc flow sheets - last fed at 5 am . Its been 5 hours.  LC offered to assist/ checked the diaper and changed a large wet diaper.  LC assisted with pillow support and latching on the left breast football.  Baby latched with depth and fed 10 mins with swallows.  Baby still hungry / LC assisted to shift the baby to the right breast /football  Latched with depth and increased swallows / still feeding ( started at 1025 am)  LC plan :  LC recommended to feed with feeding cues and by 3 hours if the baby isn't showing feeding cues / to place baby STS.  LC recommended after mom gets some rest / DEBP to be set up for post pumping after baby feeds both breast.   LC asked the [facility] to set up the DEBP .   Maternal Data - per mom had Positive breast changes with pregnancy  And leakage last trimester.  Per mom with her other 2 babies she only fed 3-4 weeks and her milk did come in.   Has patient been taught Hand Expression?: Yes  Feeding Mother's Current Feeding Choice: Breast Milk  LATCH Score Latch: Grasps breast easily, tongue down, lips flanged, rhythmical sucking.  Audible Swallowing: A few with stimulation  Type of Nipple: Everted at rest and after stimulation  Comfort (Breast/Nipple): Soft / non-tender  Hold (Positioning): Assistance needed to correctly position infant at breast and maintain latch.  LATCH Score: 8   Lactation Tools Discussed/Used    Interventions Interventions: Breast feeding basics reviewed;Assisted with latch;Skin to skin;Breast  massage;Hand express;Breast compression;Education  Discharge Pump: Personal;DEBP  Consult Status Consult Status: Follow-up Date: 11/23/21 Follow-up type: In-patient    Matilde Sprang Jacalynn Buzzell 11/22/2021, 10:28 AM

## 2021-11-23 ENCOUNTER — Inpatient Hospital Stay (HOSPITAL_COMMUNITY): Payer: 59

## 2021-11-23 ENCOUNTER — Ambulatory Visit: Payer: Self-pay

## 2021-11-23 DIAGNOSIS — M79605 Pain in left leg: Secondary | ICD-10-CM

## 2021-11-23 LAB — GLUCOSE, CAPILLARY: Glucose-Capillary: 78 mg/dL (ref 70–99)

## 2021-11-23 LAB — SURGICAL PATHOLOGY

## 2021-11-23 MED ORDER — OXYCODONE HCL 5 MG PO TABS: 5.0000 mg | ORAL_TABLET | ORAL | 0 refills | Status: AC | PRN

## 2021-11-23 NOTE — Lactation Note (Signed)
This note was copied from a baby's chart. Lactation Consultation Note  Patient Name: Girl Juanelle Trueheart [id number]'B Date: 11/23/2021 Reason for consult: Follow-up assessment;Mother's request;Term;Infant weight loss (Mom requested LC services tonight and confirmed by RN Randa Evens). Infant with weight loss of -10%.) Age:35 hours Mom latched infant on her left breast while sitting in chair using the football hold position, infant latched with depth, swallows observed. Infant was still breastfeeding after 15 minutes when LC left the room. LC suggested mom breastfeed infant skin to skin instead of swaddled in clothing and do breast compression to keep infant awake while nursing such as: breast compression, gently stroking infant's neck and shoulder and talking to infant. LC observed  flow sheet mom had given infant 10 mls of donor breast milk after latching infant at the breast earlier. LC advised mom to offer 25 mls + of donor breast milk due to infant's age  to help stabilize infant's weight. Per mom, she has been using her own personal DEBP and earlier today she started seeing drops of colostrum that she finger feed infant, mom has used DEBP twice today. Mom's current feeding plan: 1- Mom will continue to breastfeed infant according to cues, but skin to skin with every feeding. 2- Mom will supplement infant with donor breast milk after latching infant at the breast on  Day 3 ( 25 mls+) to help stabilize infant's weight.  3- Mom will start using her Personal DEBP every 3 hours for 15 minutes on initial setting to help stimulate and establish her milk supply, mom will give infant any EBM first before offering donor breast milk. 4- Mom knows to call RN/LC if she has breastfeeding questions, concerns or need assistance with latching infant at the breast.  Maternal Data    Feeding Mother's Current Feeding Choice: Breast Milk and Donor Milk  LATCH Score Latch: Grasps breast easily, tongue down, lips  flanged, rhythmical sucking.  Audible Swallowing: Spontaneous and intermittent  Type of Nipple: Everted at rest and after stimulation  Comfort (Breast/Nipple): Soft / non-tender  Hold (Positioning): No assistance needed to correctly position infant at breast.  LATCH Score: 10   Lactation Tools Discussed/Used    Interventions Interventions: Skin to skin;Hand express;Breast compression;Position options;Support pillows;Hand pump;DEBP;Education  Discharge Pump: Personal  Consult Status Consult Status: Follow-up Date: 11/24/21 Follow-up type: In-patient    Danelle Earthly 11/23/2021, 10:43 PM

## 2021-11-23 NOTE — Progress Notes (Addendum)
  Patient is eating, ambulating, voiding.  Pain control is good.  Pt c/o some swelling in L and pain in calf, started last night, hurts when she walks.  Vitals:   11/21/21 2252 11/22/21 0533 11/22/21 2100 11/23/21 0534  BP:  (!) 96/50 106/66 113/78  Pulse:  73 75 (!) 59  Resp: 18 18 16 18   Temp: 98 F (36.7 C) 98.2 F (36.8 C) 98.1 F (36.7 C) 98.1 F (36.7 C)  TempSrc: Oral Oral Oral Oral  SpO2: 100%     Weight:      Height:        lungs:   clear to auscultation cor:    RRR Abdomen:  soft, appropriate tenderness, incisions intact and without erythema or exudate ex:    no cords or edema; pain in L calf just below popliteal fossa; weak homans sign on L   Lab Results  Component Value Date   WBC 9.8 11/22/2021   HGB 9.7 (L) 11/22/2021   HCT 29.8 (L) 11/22/2021   MCV 85.1 11/22/2021   PLT 203 11/22/2021    --/--/O POS (12/02 1358)/RI  A/P    Post operative day 2 repeat c/s with salpingectomies. Will order lower L ex doppler for pain in calf, started last night.   Expect d/c today if dopplers done and negative.    Oxycodone for pain control.  Iron for anemia.

## 2021-11-23 NOTE — Progress Notes (Signed)
Lower extremity venous LT study completed.  Preliminary results relayed to   See CV Proc for preliminary results report.   Jean Rosenthal, RDMS, RVT

## 2021-11-23 NOTE — Progress Notes (Signed)
Lower extremity doppler is negative.  Pt ok to d/c.

## 2021-11-23 NOTE — Discharge Summary (Signed)
Postpartum Discharge Summary  Date of Service updated      Patient Name: Sandra Bauer DOB: 06/27/1986 MRN: [id number]  Date of admission: 11/21/2021 Delivery date:11/21/2021  Delivering provider: Jerelyn Charles  Date of discharge: 11/23/2021  Admitting diagnosis: History of cesarean delivery [Z98.891] Intrauterine pregnancy: [redacted]w[redacted]d    Secondary diagnosis:  Principal Problem:   History of cesarean delivery  Additional problems: bicurnuate uterus, A1GDM    Discharge diagnosis: Term Pregnancy Delivered                                              Post partum procedures: none Augmentation: N/A Complications: None  Hospital course: Sceduled C/S   35y.o. yo [id number] admitted to the hospital 11/21/2021 for scheduled cesarean section with the following indication:Elective Repeat and bicornuate uterus .Delivery details are as follows:  Membrane Rupture Time/Date: 8:42 AM ,11/21/2021   Delivery Method:C-Section, Low Transverse  Details of operation can be found in separate operative note.  Patient had an uncomplicated postpartum course.  She is ambulating, tolerating a regular diet, passing flatus, and urinating well. Patient is discharged home in stable condition on  11/23/21        Newborn Data: Birth date:11/21/2021  Birth time:8:42 AM  Gender:Female  Living status:Living  Apgars:9 ,9  Weight:3780 g     Magnesium Sulfate received: No BMZ received: No Rhophylac:No MMR:No T-DaP:Given prenatally Flu: No Transfusion:No  Physical exam  Vitals:   11/21/21 2252 11/22/21 0533 11/22/21 2100 11/23/21 0534  BP:  (!) 96/50 106/66 113/78  Pulse:  73 75 (!) 59  Resp: _0 Temp: 98 F (36.7 C) 98.2 F (36.8 C) 98.1 F (36.7 C) 98.1 F (36.7 C)  TempSrc: Oral Oral Oral Oral  SpO2: 100%     Weight:      Height:        Labs: Lab Results  Component Value Date   WBC 9.8 11/22/2021   HGB 9.7 (L) 11/22/2021   HCT 29.8 (L) 11/22/2021   MCV 85.1 11/22/2021    PLT 203 11/22/2021   CMP Latest Ref Rng & Units 08/31/2016  Glucose 65 - 99 mg/dL 128(H)  BUN 6 - 20 mg/dL 10  Creatinine 0.44 - 1.00 mg/dL 0.48  Sodium 135 - 145 mmol/L 136  Potassium 3.5 - 5.1 mmol/L 3.7  Chloride 101 - 111 mmol/L 110  CO2 22 - 32 mmol/L 19(L)  Calcium 8.9 - 10.3 mg/dL 8.9  Total Protein 6.0 - 8.3 g/dL -  Total Bilirubin 0.3 - 1.2 mg/dL -  Alkaline Phos 39 - 117 U/L -  AST 0 - 37 U/L -  ALT 0 - 35 U/L -   Edinburgh Score: Edinburgh Postnatal Depression Scale Screening Tool 11/22/2021  I have been able to laugh and see the funny side of things. (No Data)      After visit meds:  Allergies as of 11/23/2021       Reactions   Nuvaring [etonogestrel-ethinyl Estradiol]    Vaginal burning, HA, bloating, mood change, nose bleed   Latex Rash   Skin rash        Medication List     TAKE these medications    acetaminophen 500 MG tablet Commonly known as: TYLENOL Take 500 mg by mouth every 6 (six) hours as needed (pain.).   oxyCODONE  5 MG immediate release tablet Commonly known as: Oxy IR/ROXICODONE Take 1-2 tablets (5-10 mg total) by mouth every 4 (four) hours as needed for moderate pain.   prenatal multivitamin Tabs tablet Take 1 tablet by mouth every evening.               Discharge Care Instructions  (From admission, onward)           Start     Ordered   11/23/21 0000  Discharge wound care:       Comments: Sitz baths and icepacks to perineum.  If stitches, they will dissolve.   11/23/21 0545             Discharge home in stable condition Infant Feeding:  ? Infant Disposition:home with mother Discharge instruction: per After Visit Summary and Postpartum booklet. Activity: Advance as tolerated. Pelvic rest for 6 weeks.  Diet: carb modified diet Anticipated Birth Control:  salpingectomies done Postpartum Appointment:4 weeks Additional Postpartum F/U:  none Future Appointments:No future appointments. Follow up Visit:   Follow-up Information     Jerelyn Charles, MD Follow up in 4 week(s).   Specialty: Obstetrics Contact information: [address] [place] [address] [phone]                     11/23/2021 Daria Pastures, MD

## 2021-11-24 ENCOUNTER — Ambulatory Visit: Payer: Self-pay

## 2021-11-24 NOTE — Lactation Note (Signed)
This note was copied from a baby's chart. Lactation Consultation Note  Patient Name: Sandra Bauer [id number] Date: 11/24/2021 Reason for consult: Follow-up assessment;Term;Infant weight loss;Breastfeeding assistance Age:35 hours/ Bili today 10.8, 9 % weight loss - gain from yesterday.  LC reviewed doc flow sheets with mom and dad - per both , baby's last stool was yesterday 12/7 at 6 am I greenish brown. ( Was not on the doc flow sheets) .  Per mom pumped with her own DEBP drops x 4 in the last 24 hours.  Baby due to feed / LC checked diaper and it was dry.  Mom latched the baby on the left side and LC showed mom how to adjust the lips to flanged position. Latch 8 , swallows noted and consistent pattern , per mom comfortable.  LC reviewed BF D/C teaching.  LC recommended if able to go home today, continue to post pump after 5-6 feedings for 10 -15 mins / save milk for the next feeding to supplement after feedings until milk comes in well and F/U LC O/P.  Mom receptive to coming in for [facility] O/P appt. - LC placed in Epic and mom aware she will receive a call.     Maternal Data Has patient been taught Hand Expression?: Yes  Feeding Mother's Current Feeding Choice: Breast Milk and Donor Milk  LATCH Score Latch: Grasps breast easily, tongue down, lips flanged, rhythmical sucking.  Audible Swallowing: Spontaneous and intermittent  Type of Nipple: Everted at rest and after stimulation  Comfort (Breast/Nipple): Filling, red/small blisters or bruises, mild/mod discomfort  Hold (Positioning): Assistance needed to correctly position infant at breast and maintain latch.  LATCH Score: 8   Lactation Tools Discussed/Used Tools: Pump;Flanges Breast pump type: Double-Electric Breast Pump Pump Education: Milk Storage Pumped volume:  (drops)  Interventions Interventions: Breast feeding basics reviewed;Assisted with latch;Skin to skin;Breast massage;Hand express;Breast compression;Adjust  position;Support pillows;Position options;Education;LC Services brochure  Discharge Discharge Education: Engorgement and breast care;Warning signs for feeding baby Pump: Personal;DEBP;Manual  Consult Status Consult Status: Complete Date: 11/24/21    Matilde Sprang Tarra Pence 11/24/2021, 9:09 AM

## 2021-12-06 ENCOUNTER — Telehealth (HOSPITAL_COMMUNITY): Payer: Self-pay | Admitting: *Deleted

## 2021-12-06 NOTE — Telephone Encounter (Signed)
Attempted hospital discharge follow-up call. Voicemail box full. Deforest Hoyles, RN, 12/06/21, [phone]

## 2022-04-10 IMAGING — US US MFM OB FOLLOW-UP
1 series · 13 of 28 positions shown · non-contrast
Comparison: none

[Series 1: us mfm ob follow-up · 91 acquisitions, 13 frames shown]
[im 4/91]
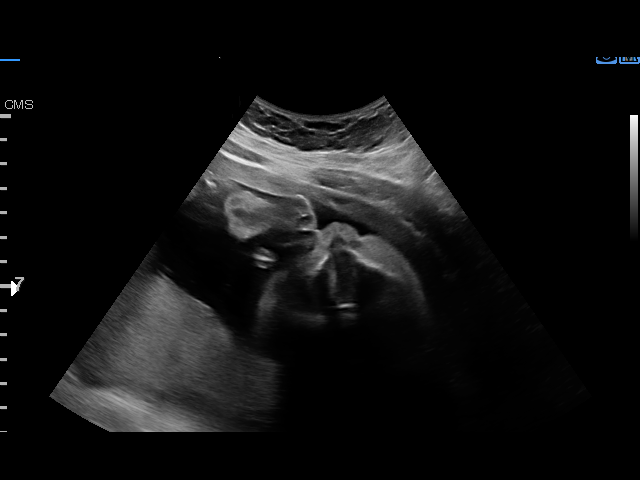
[im 11/91]
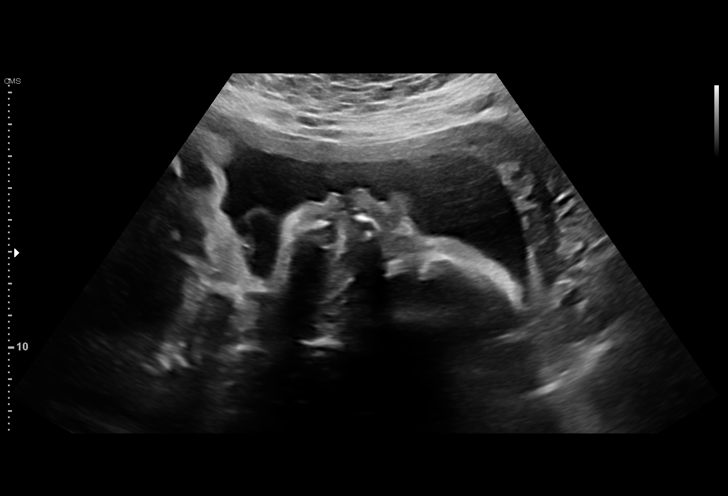
[im 17/91]
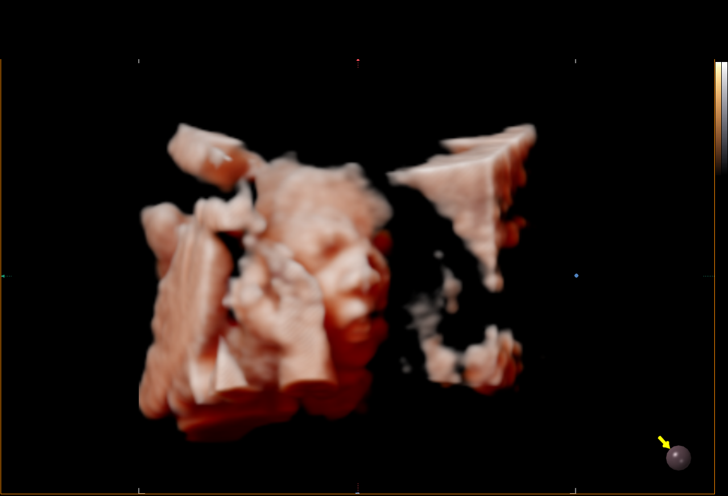
[im 24/91]
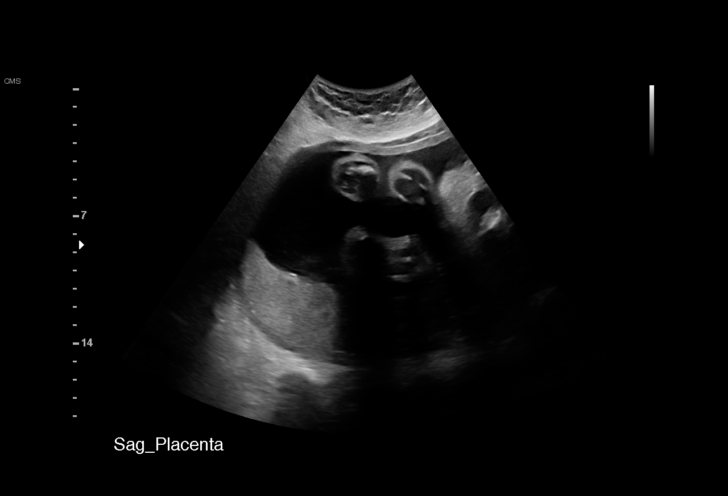
[im 31/91]
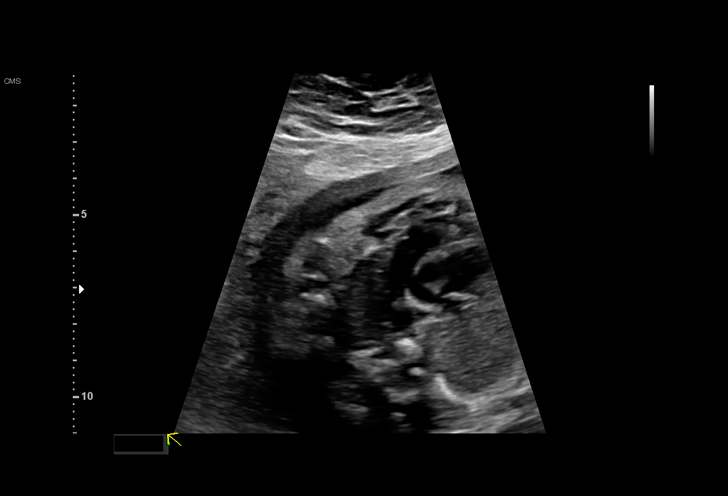
[im 37/91]
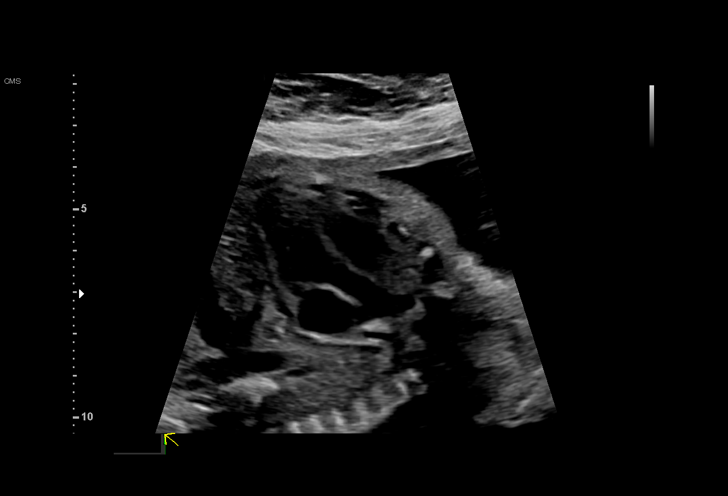
[im 47/91]
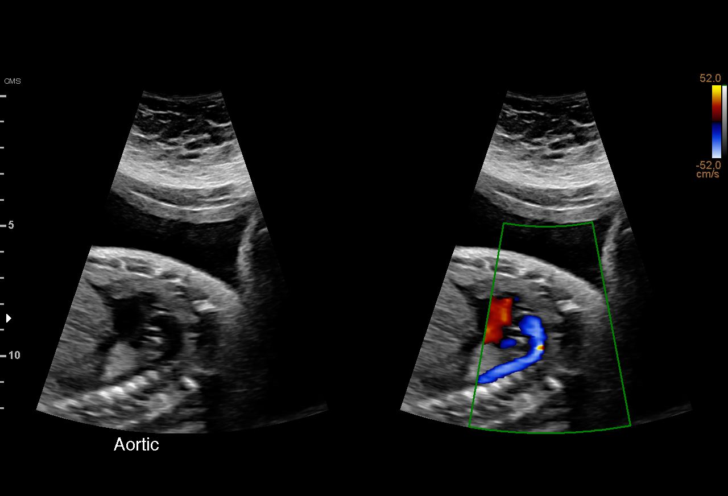
[im 54/91]
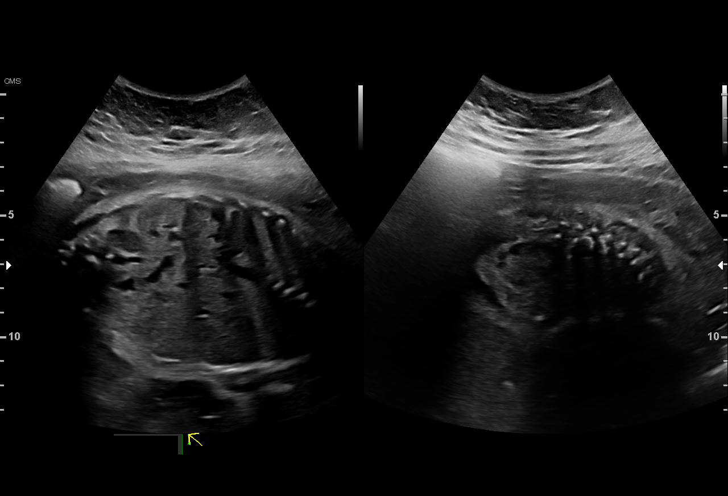
[im 61/91]
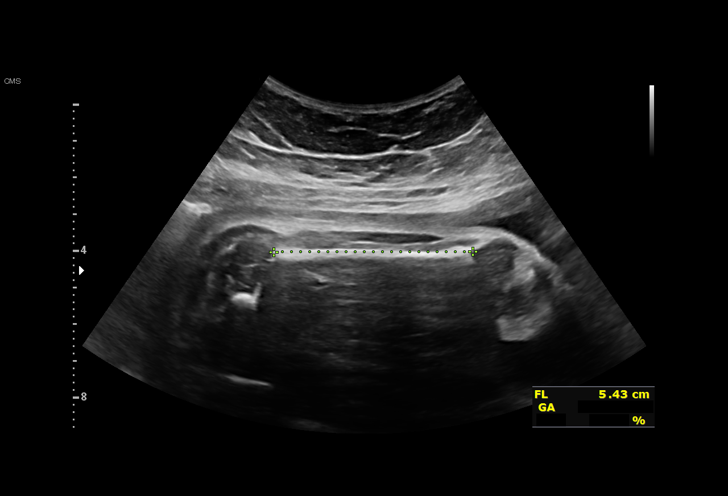
[im 67/91]
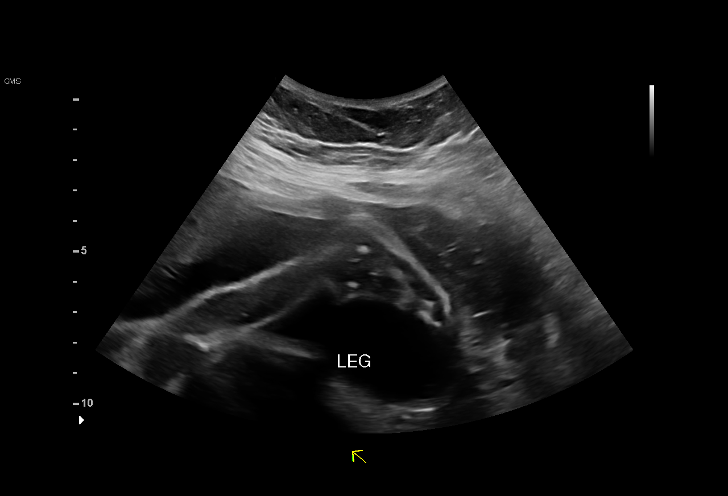
[im 74/91]
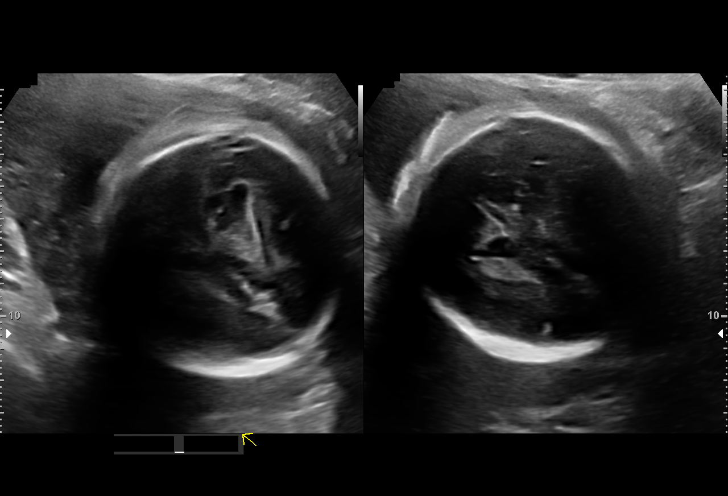
[im 81/91]
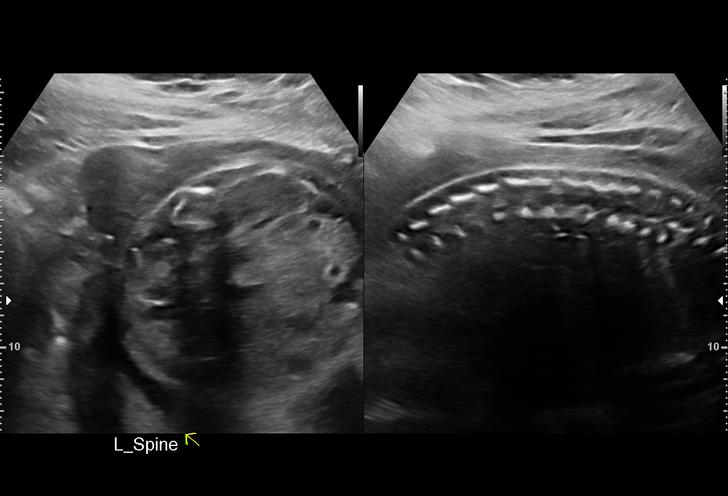
[im 87/91]
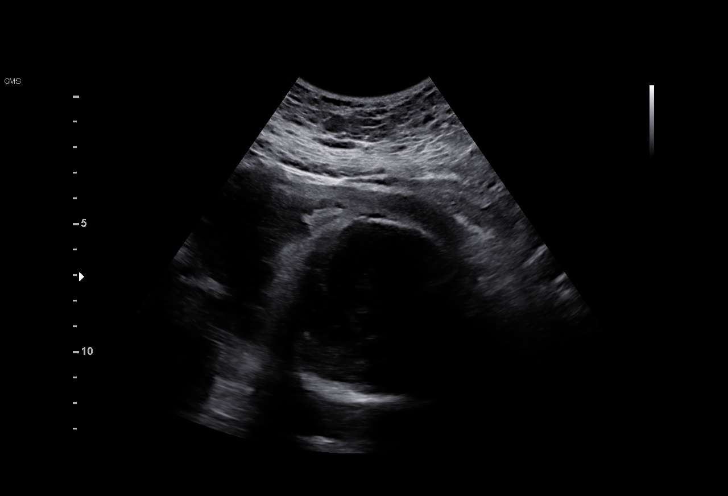

[13 of 28 positions shown; findings below may reference images not displayed]

OB/Gyn
                                                            OB/Gyn and
                                                            Infertility
                   OBGYN
                   [id number]

Indications

 Obesity complicating pregnancy, third
 trimester (pregravid BMI 33)
 Bicornuate uterus, 3RD trimester
 28 weeks gestation of pregnancy
 Poor obstetric history: Previous preterm
 delivery, antepartum
 History of cesarean delivery, currently
 pregnant
 Poor obstetric history: Previous gestational
 diabetes
 Low Risk NIPS
Fetal Evaluation

 Num Of Fetuses:         1
 Fetal Heart Rate(bpm):  131
 Cardiac Activity:       Observed
 Presentation:           Cephalic
 Placenta:               Posterior
 P. Cord Insertion:      Previously Visualized

 Amniotic Fluid
 AFI FV:      Within normal limits

 AFI Sum(cm)     %Tile       Largest Pocket(cm)
 19              74

 RUQ(cm)       RLQ(cm)       LUQ(cm)        LLQ(cm)

Biometry

 BPD:      71.6  mm     G. Age:  28w 5d         38  %    CI:        69.31   %    70 - 86
                                                         FL/HC:      19.8   %    19.6 -
 HC:      274.6  mm     G. Age:  30w 0d         57  %    HC/AC:      1.10        0.99 -
 AC:      248.7  mm     G. Age:  29w 1d         55  %    FL/BPD:     75.8   %    71 - 87
 FL:       54.3  mm     G. Age:  28w 5d         35  %    FL/AC:      21.8   %    20 - 24
 HUM:      46.9  mm     G. Age:  27w 4d         22  %

 LV:        1.6  mm

 Est. FW:    7726  gm    2 lb 15 oz      49  %
OB History

 Gravidity:    6         Term:   1        Prem:   1        SAB:   2
 Living:       2
Gestational Age

 LMP:           28w 5d        Date:  02/20/21                 EDD:   11/27/21
 U/S Today:     29w 1d                                        EDD:   11/24/21
 Best:          28w 5d     Det. By:  LMP  (02/20/21)          EDD:   11/27/21
Anatomy

 Cranium:               Appears normal         LVOT:                   Appears normal
 Cavum:                 Appears normal         Aortic Arch:            Appears normal
 Ventricles:            Appears normal         Ductal Arch:            Appears normal
 Choroid Plexus:        Appears normal         Diaphragm:              Appears normal
 Cerebellum:            Appears normal         Stomach:                Appears normal, left
                                                                       sided
 Posterior Fossa:       Appears normal         Abdomen:                Appears normal
 Nuchal Fold:           Previously seen        Abdominal Wall:         Appears nml (cord
                                                                       insert, abd wall)
 Face:                  Appears normal         Cord Vessels:           Appears normal (3
                        (orbits and profile)                           vessel cord)
 Lips:                  Appears normal         Kidneys:                Appear normal
 Palate:                Appears normal         Bladder:                Appears normal
 Thoracic:              Appears normal         Spine:                  Previously seen
 Heart:                 Appears normal         Upper Extremities:      Previously seen
                        (4CH, axis, and
                        situs)
 RVOT:                  Appears normal         Lower Extremities:      Previously seen

 Other:  Female gender previously visualized. Heels/feet, nasal bone, lenses,
         3VV, 3VT, and VC visualized.
Cervix Uterus Adnexa

 Cervix
 Not visualized (advanced GA >42wks)

 Uterus
 Bicornuate.

 Right Ovary
 Within normal limits.

 Left Ovary
 Within normal limits.

 Cul De Sac
 No free fluid seen.

 Adnexa
 No abnormality visualized.
Comments

 This patient was seen for a follow up growth scan due to a
 bicornuate uterus and maternal obesity.  She reports that she
 recently failed her 1 hour glucose challenge test.  She has a
 3-hour GTT scheduled next week.
 She was informed that the fetal growth and amniotic fluid
 level appears appropriate for her gestational age.
 Due to her bicornuate uterus, a follow-up growth scan was
 scheduled in 5 weeks.
